# Patient Record
Sex: Female | Born: 1935
Health system: Southern US, Community
[De-identification: ages and names within clinical notes are randomized; demographics above are authoritative.]

## PROBLEM LIST (undated history)

## (undated) DIAGNOSIS — M1990 Unspecified osteoarthritis, unspecified site: Secondary | ICD-10-CM

## (undated) DIAGNOSIS — T8859XA Other complications of anesthesia, initial encounter: Secondary | ICD-10-CM

## (undated) DIAGNOSIS — H3530 Unspecified macular degeneration: Secondary | ICD-10-CM

## (undated) DIAGNOSIS — E119 Type 2 diabetes mellitus without complications: Secondary | ICD-10-CM

## (undated) DIAGNOSIS — R112 Nausea with vomiting, unspecified: Secondary | ICD-10-CM

## (undated) DIAGNOSIS — E039 Hypothyroidism, unspecified: Secondary | ICD-10-CM

## (undated) DIAGNOSIS — I10 Essential (primary) hypertension: Secondary | ICD-10-CM

## (undated) DIAGNOSIS — D649 Anemia, unspecified: Secondary | ICD-10-CM

## (undated) DIAGNOSIS — T4145XA Adverse effect of unspecified anesthetic, initial encounter: Secondary | ICD-10-CM

## (undated) DIAGNOSIS — N189 Chronic kidney disease, unspecified: Secondary | ICD-10-CM

## (undated) DIAGNOSIS — Z9889 Other specified postprocedural states: Secondary | ICD-10-CM

## (undated) DIAGNOSIS — K219 Gastro-esophageal reflux disease without esophagitis: Secondary | ICD-10-CM

## (undated) HISTORY — PX: EYE SURGERY: SHX253

---
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
## 1968-02-14 HISTORY — PX: ABDOMINAL HYSTERECTOMY: SHX81

## 2011-01-19 DIAGNOSIS — I639 Cerebral infarction, unspecified: Secondary | ICD-10-CM

## 2011-01-19 HISTORY — DX: Cerebral infarction, unspecified: I63.9

## 2013-02-21 DIAGNOSIS — E039 Hypothyroidism, unspecified: Secondary | ICD-10-CM | POA: Diagnosis not present

## 2013-02-24 DIAGNOSIS — H401290 Low-tension glaucoma, unspecified eye, stage unspecified: Secondary | ICD-10-CM | POA: Diagnosis not present

## 2013-02-24 DIAGNOSIS — H353190 Nonexudative age-related macular degeneration, unspecified eye, stage unspecified: Secondary | ICD-10-CM | POA: Diagnosis not present

## 2013-02-24 DIAGNOSIS — H02839 Dermatochalasis of unspecified eye, unspecified eyelid: Secondary | ICD-10-CM | POA: Diagnosis not present

## 2013-02-24 DIAGNOSIS — H353290 Exudative age-related macular degeneration, unspecified eye, stage unspecified: Secondary | ICD-10-CM | POA: Diagnosis not present

## 2013-02-24 DIAGNOSIS — H35739 Hemorrhagic detachment of retinal pigment epithelium, unspecified eye: Secondary | ICD-10-CM | POA: Diagnosis not present

## 2013-03-10 DIAGNOSIS — Z85828 Personal history of other malignant neoplasm of skin: Secondary | ICD-10-CM | POA: Diagnosis not present

## 2013-03-10 DIAGNOSIS — I872 Venous insufficiency (chronic) (peripheral): Secondary | ICD-10-CM | POA: Diagnosis not present

## 2013-04-23 DIAGNOSIS — H02839 Dermatochalasis of unspecified eye, unspecified eyelid: Secondary | ICD-10-CM | POA: Diagnosis not present

## 2013-04-23 DIAGNOSIS — H35739 Hemorrhagic detachment of retinal pigment epithelium, unspecified eye: Secondary | ICD-10-CM | POA: Diagnosis not present

## 2013-04-23 DIAGNOSIS — H353290 Exudative age-related macular degeneration, unspecified eye, stage unspecified: Secondary | ICD-10-CM | POA: Diagnosis not present

## 2013-04-23 DIAGNOSIS — H353190 Nonexudative age-related macular degeneration, unspecified eye, stage unspecified: Secondary | ICD-10-CM | POA: Diagnosis not present

## 2013-04-23 DIAGNOSIS — H401290 Low-tension glaucoma, unspecified eye, stage unspecified: Secondary | ICD-10-CM | POA: Diagnosis not present

## 2013-05-06 DIAGNOSIS — E875 Hyperkalemia: Secondary | ICD-10-CM | POA: Diagnosis not present

## 2013-05-06 DIAGNOSIS — E782 Mixed hyperlipidemia: Secondary | ICD-10-CM | POA: Diagnosis not present

## 2013-05-06 DIAGNOSIS — I10 Essential (primary) hypertension: Secondary | ICD-10-CM | POA: Diagnosis not present

## 2013-05-06 DIAGNOSIS — IMO0001 Reserved for inherently not codable concepts without codable children: Secondary | ICD-10-CM | POA: Diagnosis not present

## 2013-05-22 DIAGNOSIS — H3530 Unspecified macular degeneration: Secondary | ICD-10-CM | POA: Diagnosis not present

## 2013-05-22 DIAGNOSIS — I714 Abdominal aortic aneurysm, without rupture, unspecified: Secondary | ICD-10-CM | POA: Diagnosis not present

## 2013-05-22 DIAGNOSIS — E039 Hypothyroidism, unspecified: Secondary | ICD-10-CM | POA: Diagnosis not present

## 2013-05-22 DIAGNOSIS — E1129 Type 2 diabetes mellitus with other diabetic kidney complication: Secondary | ICD-10-CM | POA: Diagnosis not present

## 2013-05-22 DIAGNOSIS — E782 Mixed hyperlipidemia: Secondary | ICD-10-CM | POA: Diagnosis not present

## 2013-05-22 DIAGNOSIS — I6789 Other cerebrovascular disease: Secondary | ICD-10-CM | POA: Diagnosis not present

## 2013-05-22 DIAGNOSIS — N183 Chronic kidney disease, stage 3 unspecified: Secondary | ICD-10-CM | POA: Diagnosis not present

## 2013-05-22 DIAGNOSIS — I10 Essential (primary) hypertension: Secondary | ICD-10-CM | POA: Diagnosis not present

## 2013-06-25 DIAGNOSIS — H353290 Exudative age-related macular degeneration, unspecified eye, stage unspecified: Secondary | ICD-10-CM | POA: Diagnosis not present

## 2013-06-25 DIAGNOSIS — H35739 Hemorrhagic detachment of retinal pigment epithelium, unspecified eye: Secondary | ICD-10-CM | POA: Diagnosis not present

## 2013-06-25 DIAGNOSIS — H353190 Nonexudative age-related macular degeneration, unspecified eye, stage unspecified: Secondary | ICD-10-CM | POA: Diagnosis not present

## 2013-06-30 DIAGNOSIS — S139XXA Sprain of joints and ligaments of unspecified parts of neck, initial encounter: Secondary | ICD-10-CM | POA: Diagnosis not present

## 2013-07-09 DIAGNOSIS — J019 Acute sinusitis, unspecified: Secondary | ICD-10-CM | POA: Diagnosis not present

## 2013-08-08 DIAGNOSIS — I10 Essential (primary) hypertension: Secondary | ICD-10-CM | POA: Diagnosis not present

## 2013-08-08 DIAGNOSIS — R918 Other nonspecific abnormal finding of lung field: Secondary | ICD-10-CM | POA: Diagnosis not present

## 2013-08-08 DIAGNOSIS — I712 Thoracic aortic aneurysm, without rupture, unspecified: Secondary | ICD-10-CM | POA: Diagnosis not present

## 2013-08-08 DIAGNOSIS — N269 Renal sclerosis, unspecified: Secondary | ICD-10-CM | POA: Diagnosis not present

## 2013-08-12 DIAGNOSIS — I729 Aneurysm of unspecified site: Secondary | ICD-10-CM | POA: Diagnosis not present

## 2013-08-12 DIAGNOSIS — I10 Essential (primary) hypertension: Secondary | ICD-10-CM | POA: Diagnosis not present

## 2013-08-12 DIAGNOSIS — R51 Headache: Secondary | ICD-10-CM | POA: Diagnosis not present

## 2013-08-12 DIAGNOSIS — M542 Cervicalgia: Secondary | ICD-10-CM | POA: Diagnosis not present

## 2013-08-18 DIAGNOSIS — H532 Diplopia: Secondary | ICD-10-CM | POA: Diagnosis not present

## 2013-08-18 DIAGNOSIS — R030 Elevated blood-pressure reading, without diagnosis of hypertension: Secondary | ICD-10-CM | POA: Diagnosis not present

## 2013-08-18 DIAGNOSIS — R110 Nausea: Secondary | ICD-10-CM | POA: Diagnosis not present

## 2013-08-18 DIAGNOSIS — R51 Headache: Secondary | ICD-10-CM | POA: Diagnosis not present

## 2013-08-19 DIAGNOSIS — H401290 Low-tension glaucoma, unspecified eye, stage unspecified: Secondary | ICD-10-CM | POA: Diagnosis not present

## 2013-08-19 DIAGNOSIS — H35739 Hemorrhagic detachment of retinal pigment epithelium, unspecified eye: Secondary | ICD-10-CM | POA: Diagnosis not present

## 2013-08-19 DIAGNOSIS — H353190 Nonexudative age-related macular degeneration, unspecified eye, stage unspecified: Secondary | ICD-10-CM | POA: Diagnosis not present

## 2013-08-19 DIAGNOSIS — H353290 Exudative age-related macular degeneration, unspecified eye, stage unspecified: Secondary | ICD-10-CM | POA: Diagnosis not present

## 2013-08-22 DIAGNOSIS — I10 Essential (primary) hypertension: Secondary | ICD-10-CM | POA: Diagnosis not present

## 2013-08-22 DIAGNOSIS — M542 Cervicalgia: Secondary | ICD-10-CM | POA: Diagnosis not present

## 2013-08-22 DIAGNOSIS — M62838 Other muscle spasm: Secondary | ICD-10-CM | POA: Diagnosis not present

## 2013-08-22 DIAGNOSIS — I729 Aneurysm of unspecified site: Secondary | ICD-10-CM | POA: Diagnosis not present

## 2013-08-29 DIAGNOSIS — S139XXA Sprain of joints and ligaments of unspecified parts of neck, initial encounter: Secondary | ICD-10-CM | POA: Diagnosis not present

## 2013-08-29 DIAGNOSIS — I10 Essential (primary) hypertension: Secondary | ICD-10-CM | POA: Diagnosis not present

## 2013-09-04 DIAGNOSIS — M5030 Other cervical disc degeneration, unspecified cervical region: Secondary | ICD-10-CM | POA: Diagnosis not present

## 2013-09-04 DIAGNOSIS — M4802 Spinal stenosis, cervical region: Secondary | ICD-10-CM | POA: Diagnosis not present

## 2013-09-04 DIAGNOSIS — M47812 Spondylosis without myelopathy or radiculopathy, cervical region: Secondary | ICD-10-CM | POA: Diagnosis not present

## 2013-09-09 DIAGNOSIS — L570 Actinic keratosis: Secondary | ICD-10-CM | POA: Diagnosis not present

## 2013-09-09 DIAGNOSIS — L98499 Non-pressure chronic ulcer of skin of other sites with unspecified severity: Secondary | ICD-10-CM | POA: Diagnosis not present

## 2013-09-09 DIAGNOSIS — L259 Unspecified contact dermatitis, unspecified cause: Secondary | ICD-10-CM | POA: Diagnosis not present

## 2013-09-09 DIAGNOSIS — I10 Essential (primary) hypertension: Secondary | ICD-10-CM | POA: Diagnosis not present

## 2013-09-23 DIAGNOSIS — M999 Biomechanical lesion, unspecified: Secondary | ICD-10-CM | POA: Diagnosis not present

## 2013-09-23 DIAGNOSIS — IMO0002 Reserved for concepts with insufficient information to code with codable children: Secondary | ICD-10-CM | POA: Diagnosis not present

## 2013-09-24 DIAGNOSIS — M999 Biomechanical lesion, unspecified: Secondary | ICD-10-CM | POA: Diagnosis not present

## 2013-09-24 DIAGNOSIS — IMO0002 Reserved for concepts with insufficient information to code with codable children: Secondary | ICD-10-CM | POA: Diagnosis not present

## 2013-09-25 DIAGNOSIS — IMO0002 Reserved for concepts with insufficient information to code with codable children: Secondary | ICD-10-CM | POA: Diagnosis not present

## 2013-09-25 DIAGNOSIS — M999 Biomechanical lesion, unspecified: Secondary | ICD-10-CM | POA: Diagnosis not present

## 2013-09-29 DIAGNOSIS — IMO0002 Reserved for concepts with insufficient information to code with codable children: Secondary | ICD-10-CM | POA: Diagnosis not present

## 2013-09-29 DIAGNOSIS — M999 Biomechanical lesion, unspecified: Secondary | ICD-10-CM | POA: Diagnosis not present

## 2013-09-30 DIAGNOSIS — IMO0002 Reserved for concepts with insufficient information to code with codable children: Secondary | ICD-10-CM | POA: Diagnosis not present

## 2013-09-30 DIAGNOSIS — M999 Biomechanical lesion, unspecified: Secondary | ICD-10-CM | POA: Diagnosis not present

## 2013-10-01 DIAGNOSIS — IMO0002 Reserved for concepts with insufficient information to code with codable children: Secondary | ICD-10-CM | POA: Diagnosis not present

## 2013-10-01 DIAGNOSIS — E782 Mixed hyperlipidemia: Secondary | ICD-10-CM | POA: Diagnosis not present

## 2013-10-01 DIAGNOSIS — M999 Biomechanical lesion, unspecified: Secondary | ICD-10-CM | POA: Diagnosis not present

## 2013-10-01 DIAGNOSIS — N183 Chronic kidney disease, stage 3 unspecified: Secondary | ICD-10-CM | POA: Diagnosis not present

## 2013-10-01 DIAGNOSIS — E039 Hypothyroidism, unspecified: Secondary | ICD-10-CM | POA: Diagnosis not present

## 2013-10-01 DIAGNOSIS — IMO0001 Reserved for inherently not codable concepts without codable children: Secondary | ICD-10-CM | POA: Diagnosis not present

## 2013-10-01 DIAGNOSIS — I10 Essential (primary) hypertension: Secondary | ICD-10-CM | POA: Diagnosis not present

## 2013-10-06 DIAGNOSIS — IMO0002 Reserved for concepts with insufficient information to code with codable children: Secondary | ICD-10-CM | POA: Diagnosis not present

## 2013-10-06 DIAGNOSIS — M999 Biomechanical lesion, unspecified: Secondary | ICD-10-CM | POA: Diagnosis not present

## 2013-10-07 DIAGNOSIS — M999 Biomechanical lesion, unspecified: Secondary | ICD-10-CM | POA: Diagnosis not present

## 2013-10-07 DIAGNOSIS — IMO0002 Reserved for concepts with insufficient information to code with codable children: Secondary | ICD-10-CM | POA: Diagnosis not present

## 2013-10-09 DIAGNOSIS — I714 Abdominal aortic aneurysm, without rupture, unspecified: Secondary | ICD-10-CM | POA: Diagnosis not present

## 2013-10-09 DIAGNOSIS — E782 Mixed hyperlipidemia: Secondary | ICD-10-CM | POA: Diagnosis not present

## 2013-10-09 DIAGNOSIS — H3530 Unspecified macular degeneration: Secondary | ICD-10-CM | POA: Diagnosis not present

## 2013-10-09 DIAGNOSIS — I10 Essential (primary) hypertension: Secondary | ICD-10-CM | POA: Diagnosis not present

## 2013-10-09 DIAGNOSIS — I6789 Other cerebrovascular disease: Secondary | ICD-10-CM | POA: Diagnosis not present

## 2013-10-09 DIAGNOSIS — IMO0002 Reserved for concepts with insufficient information to code with codable children: Secondary | ICD-10-CM | POA: Diagnosis not present

## 2013-10-09 DIAGNOSIS — M999 Biomechanical lesion, unspecified: Secondary | ICD-10-CM | POA: Diagnosis not present

## 2013-10-09 DIAGNOSIS — E1129 Type 2 diabetes mellitus with other diabetic kidney complication: Secondary | ICD-10-CM | POA: Diagnosis not present

## 2013-10-09 DIAGNOSIS — N183 Chronic kidney disease, stage 3 unspecified: Secondary | ICD-10-CM | POA: Diagnosis not present

## 2013-10-09 DIAGNOSIS — E039 Hypothyroidism, unspecified: Secondary | ICD-10-CM | POA: Diagnosis not present

## 2013-10-10 DIAGNOSIS — Z8679 Personal history of other diseases of the circulatory system: Secondary | ICD-10-CM | POA: Diagnosis not present

## 2013-10-10 DIAGNOSIS — I771 Stricture of artery: Secondary | ICD-10-CM | POA: Diagnosis not present

## 2013-10-10 DIAGNOSIS — R55 Syncope and collapse: Secondary | ICD-10-CM | POA: Diagnosis not present

## 2013-10-10 DIAGNOSIS — I10 Essential (primary) hypertension: Secondary | ICD-10-CM | POA: Diagnosis not present

## 2013-10-10 DIAGNOSIS — Z885 Allergy status to narcotic agent status: Secondary | ICD-10-CM | POA: Diagnosis not present

## 2013-10-10 DIAGNOSIS — Z7982 Long term (current) use of aspirin: Secondary | ICD-10-CM | POA: Diagnosis not present

## 2013-10-10 DIAGNOSIS — E039 Hypothyroidism, unspecified: Secondary | ICD-10-CM | POA: Diagnosis not present

## 2013-10-10 DIAGNOSIS — R112 Nausea with vomiting, unspecified: Secondary | ICD-10-CM | POA: Diagnosis not present

## 2013-10-10 DIAGNOSIS — R404 Transient alteration of awareness: Secondary | ICD-10-CM | POA: Diagnosis not present

## 2013-10-10 DIAGNOSIS — E119 Type 2 diabetes mellitus without complications: Secondary | ICD-10-CM | POA: Diagnosis not present

## 2013-10-10 DIAGNOSIS — J9819 Other pulmonary collapse: Secondary | ICD-10-CM | POA: Diagnosis not present

## 2013-10-10 DIAGNOSIS — I517 Cardiomegaly: Secondary | ICD-10-CM | POA: Diagnosis not present

## 2013-10-10 DIAGNOSIS — Z90710 Acquired absence of both cervix and uterus: Secondary | ICD-10-CM | POA: Diagnosis not present

## 2013-10-10 DIAGNOSIS — Z882 Allergy status to sulfonamides status: Secondary | ICD-10-CM | POA: Diagnosis not present

## 2013-10-10 DIAGNOSIS — E860 Dehydration: Secondary | ICD-10-CM | POA: Diagnosis not present

## 2013-10-10 DIAGNOSIS — Z79899 Other long term (current) drug therapy: Secondary | ICD-10-CM | POA: Diagnosis not present

## 2013-10-10 DIAGNOSIS — K449 Diaphragmatic hernia without obstruction or gangrene: Secondary | ICD-10-CM | POA: Diagnosis not present

## 2013-10-10 DIAGNOSIS — Z880 Allergy status to penicillin: Secondary | ICD-10-CM | POA: Diagnosis not present

## 2013-10-10 DIAGNOSIS — R6889 Other general symptoms and signs: Secondary | ICD-10-CM | POA: Diagnosis not present

## 2013-10-13 DIAGNOSIS — M999 Biomechanical lesion, unspecified: Secondary | ICD-10-CM | POA: Diagnosis not present

## 2013-10-13 DIAGNOSIS — IMO0002 Reserved for concepts with insufficient information to code with codable children: Secondary | ICD-10-CM | POA: Diagnosis not present

## 2013-10-13 DIAGNOSIS — R55 Syncope and collapse: Secondary | ICD-10-CM | POA: Diagnosis not present

## 2013-10-23 DIAGNOSIS — I6529 Occlusion and stenosis of unspecified carotid artery: Secondary | ICD-10-CM | POA: Diagnosis not present

## 2013-10-23 DIAGNOSIS — I716 Thoracoabdominal aortic aneurysm, without rupture, unspecified: Secondary | ICD-10-CM | POA: Diagnosis not present

## 2013-10-23 DIAGNOSIS — I712 Thoracic aortic aneurysm, without rupture, unspecified: Secondary | ICD-10-CM | POA: Diagnosis not present

## 2013-10-24 DIAGNOSIS — K5730 Diverticulosis of large intestine without perforation or abscess without bleeding: Secondary | ICD-10-CM | POA: Diagnosis not present

## 2013-10-24 DIAGNOSIS — M899 Disorder of bone, unspecified: Secondary | ICD-10-CM | POA: Diagnosis not present

## 2013-10-24 DIAGNOSIS — R918 Other nonspecific abnormal finding of lung field: Secondary | ICD-10-CM | POA: Diagnosis not present

## 2013-10-24 DIAGNOSIS — N2889 Other specified disorders of kidney and ureter: Secondary | ICD-10-CM | POA: Diagnosis not present

## 2013-10-24 DIAGNOSIS — I701 Atherosclerosis of renal artery: Secondary | ICD-10-CM | POA: Diagnosis not present

## 2013-10-24 DIAGNOSIS — N200 Calculus of kidney: Secondary | ICD-10-CM | POA: Diagnosis not present

## 2013-10-24 DIAGNOSIS — R599 Enlarged lymph nodes, unspecified: Secondary | ICD-10-CM | POA: Diagnosis not present

## 2013-10-24 DIAGNOSIS — Z01810 Encounter for preprocedural cardiovascular examination: Secondary | ICD-10-CM | POA: Diagnosis not present

## 2013-10-24 DIAGNOSIS — I716 Thoracoabdominal aortic aneurysm, without rupture, unspecified: Secondary | ICD-10-CM | POA: Diagnosis not present

## 2013-10-24 DIAGNOSIS — I749 Embolism and thrombosis of unspecified artery: Secondary | ICD-10-CM | POA: Diagnosis not present

## 2013-10-24 DIAGNOSIS — M949 Disorder of cartilage, unspecified: Secondary | ICD-10-CM | POA: Diagnosis not present

## 2013-10-24 DIAGNOSIS — N269 Renal sclerosis, unspecified: Secondary | ICD-10-CM | POA: Diagnosis not present

## 2013-10-28 DIAGNOSIS — Z23 Encounter for immunization: Secondary | ICD-10-CM | POA: Diagnosis not present

## 2013-11-05 DIAGNOSIS — H01009 Unspecified blepharitis unspecified eye, unspecified eyelid: Secondary | ICD-10-CM | POA: Diagnosis not present

## 2013-11-05 DIAGNOSIS — H02839 Dermatochalasis of unspecified eye, unspecified eyelid: Secondary | ICD-10-CM | POA: Diagnosis not present

## 2013-11-05 DIAGNOSIS — H35739 Hemorrhagic detachment of retinal pigment epithelium, unspecified eye: Secondary | ICD-10-CM | POA: Diagnosis not present

## 2013-11-05 DIAGNOSIS — H353290 Exudative age-related macular degeneration, unspecified eye, stage unspecified: Secondary | ICD-10-CM | POA: Diagnosis not present

## 2013-11-05 DIAGNOSIS — H353190 Nonexudative age-related macular degeneration, unspecified eye, stage unspecified: Secondary | ICD-10-CM | POA: Diagnosis not present

## 2013-11-05 DIAGNOSIS — H401290 Low-tension glaucoma, unspecified eye, stage unspecified: Secondary | ICD-10-CM | POA: Diagnosis not present

## 2013-11-13 HISTORY — PX: VASCULAR SURGERY: SHX849

## 2013-11-27 DIAGNOSIS — I10 Essential (primary) hypertension: Secondary | ICD-10-CM | POA: Diagnosis not present

## 2013-11-27 DIAGNOSIS — I716 Thoracoabdominal aortic aneurysm, without rupture: Secondary | ICD-10-CM | POA: Diagnosis not present

## 2013-11-27 DIAGNOSIS — I272 Other secondary pulmonary hypertension: Secondary | ICD-10-CM | POA: Diagnosis not present

## 2013-11-27 DIAGNOSIS — I712 Thoracic aortic aneurysm, without rupture: Secondary | ICD-10-CM | POA: Diagnosis not present

## 2013-11-27 DIAGNOSIS — R911 Solitary pulmonary nodule: Secondary | ICD-10-CM | POA: Diagnosis not present

## 2013-11-27 DIAGNOSIS — J9811 Atelectasis: Secondary | ICD-10-CM | POA: Diagnosis not present

## 2013-11-27 DIAGNOSIS — E785 Hyperlipidemia, unspecified: Secondary | ICD-10-CM | POA: Diagnosis not present

## 2013-11-27 DIAGNOSIS — I071 Rheumatic tricuspid insufficiency: Secondary | ICD-10-CM | POA: Diagnosis not present

## 2013-11-27 DIAGNOSIS — Z79899 Other long term (current) drug therapy: Secondary | ICD-10-CM | POA: Diagnosis not present

## 2013-11-27 DIAGNOSIS — I700 Atherosclerosis of aorta: Secondary | ICD-10-CM | POA: Diagnosis not present

## 2013-11-27 DIAGNOSIS — I517 Cardiomegaly: Secondary | ICD-10-CM | POA: Diagnosis not present

## 2013-11-27 DIAGNOSIS — Z7982 Long term (current) use of aspirin: Secondary | ICD-10-CM | POA: Diagnosis not present

## 2013-11-27 DIAGNOSIS — Z01818 Encounter for other preprocedural examination: Secondary | ICD-10-CM | POA: Diagnosis not present

## 2013-12-09 DIAGNOSIS — E785 Hyperlipidemia, unspecified: Secondary | ICD-10-CM | POA: Diagnosis present

## 2013-12-09 DIAGNOSIS — I716 Thoracoabdominal aortic aneurysm, without rupture: Secondary | ICD-10-CM | POA: Diagnosis not present

## 2013-12-09 DIAGNOSIS — N200 Calculus of kidney: Secondary | ICD-10-CM | POA: Diagnosis not present

## 2013-12-09 DIAGNOSIS — J95821 Acute postprocedural respiratory failure: Secondary | ICD-10-CM | POA: Diagnosis not present

## 2013-12-09 DIAGNOSIS — I10 Essential (primary) hypertension: Secondary | ICD-10-CM | POA: Diagnosis not present

## 2013-12-09 DIAGNOSIS — G8918 Other acute postprocedural pain: Secondary | ICD-10-CM | POA: Diagnosis not present

## 2013-12-09 DIAGNOSIS — J9601 Acute respiratory failure with hypoxia: Secondary | ICD-10-CM | POA: Diagnosis not present

## 2013-12-09 DIAGNOSIS — D62 Acute posthemorrhagic anemia: Secondary | ICD-10-CM | POA: Diagnosis not present

## 2013-12-09 DIAGNOSIS — I712 Thoracic aortic aneurysm, without rupture: Secondary | ICD-10-CM | POA: Diagnosis not present

## 2013-12-09 DIAGNOSIS — E039 Hypothyroidism, unspecified: Secondary | ICD-10-CM | POA: Diagnosis present

## 2013-12-09 DIAGNOSIS — N390 Urinary tract infection, site not specified: Secondary | ICD-10-CM | POA: Diagnosis not present

## 2013-12-09 DIAGNOSIS — Z8673 Personal history of transient ischemic attack (TIA), and cerebral infarction without residual deficits: Secondary | ICD-10-CM | POA: Diagnosis not present

## 2013-12-09 DIAGNOSIS — E119 Type 2 diabetes mellitus without complications: Secondary | ICD-10-CM | POA: Diagnosis not present

## 2013-12-09 DIAGNOSIS — M47819 Spondylosis without myelopathy or radiculopathy, site unspecified: Secondary | ICD-10-CM | POA: Diagnosis not present

## 2013-12-09 DIAGNOSIS — N289 Disorder of kidney and ureter, unspecified: Secondary | ICD-10-CM | POA: Diagnosis present

## 2013-12-09 DIAGNOSIS — I739 Peripheral vascular disease, unspecified: Secondary | ICD-10-CM | POA: Diagnosis not present

## 2013-12-09 DIAGNOSIS — I7772 Dissection of iliac artery: Secondary | ICD-10-CM | POA: Diagnosis not present

## 2013-12-25 DIAGNOSIS — I716 Thoracoabdominal aortic aneurysm, without rupture: Secondary | ICD-10-CM | POA: Diagnosis not present

## 2014-01-01 DIAGNOSIS — H01001 Unspecified blepharitis right upper eyelid: Secondary | ICD-10-CM | POA: Diagnosis not present

## 2014-01-01 DIAGNOSIS — H35731 Hemorrhagic detachment of retinal pigment epithelium, right eye: Secondary | ICD-10-CM | POA: Diagnosis not present

## 2014-01-01 DIAGNOSIS — H02834 Dermatochalasis of left upper eyelid: Secondary | ICD-10-CM | POA: Diagnosis not present

## 2014-01-01 DIAGNOSIS — H3531 Nonexudative age-related macular degeneration: Secondary | ICD-10-CM | POA: Diagnosis not present

## 2014-01-01 DIAGNOSIS — H01004 Unspecified blepharitis left upper eyelid: Secondary | ICD-10-CM | POA: Diagnosis not present

## 2014-01-01 DIAGNOSIS — H40003 Preglaucoma, unspecified, bilateral: Secondary | ICD-10-CM | POA: Diagnosis not present

## 2014-01-01 DIAGNOSIS — H3532 Exudative age-related macular degeneration: Secondary | ICD-10-CM | POA: Diagnosis not present

## 2014-01-01 DIAGNOSIS — H02831 Dermatochalasis of right upper eyelid: Secondary | ICD-10-CM | POA: Diagnosis not present

## 2014-01-16 DIAGNOSIS — I716 Thoracoabdominal aortic aneurysm, without rupture: Secondary | ICD-10-CM | POA: Diagnosis not present

## 2014-01-18 DIAGNOSIS — R1031 Right lower quadrant pain: Secondary | ICD-10-CM | POA: Diagnosis not present

## 2014-01-18 DIAGNOSIS — I441 Atrioventricular block, second degree: Secondary | ICD-10-CM | POA: Diagnosis not present

## 2014-01-18 DIAGNOSIS — I129 Hypertensive chronic kidney disease with stage 1 through stage 4 chronic kidney disease, or unspecified chronic kidney disease: Secondary | ICD-10-CM | POA: Diagnosis present

## 2014-01-18 DIAGNOSIS — N179 Acute kidney failure, unspecified: Secondary | ICD-10-CM | POA: Diagnosis not present

## 2014-01-18 DIAGNOSIS — E119 Type 2 diabetes mellitus without complications: Secondary | ICD-10-CM | POA: Diagnosis not present

## 2014-01-18 DIAGNOSIS — J984 Other disorders of lung: Secondary | ICD-10-CM | POA: Diagnosis not present

## 2014-01-18 DIAGNOSIS — E039 Hypothyroidism, unspecified: Secondary | ICD-10-CM | POA: Diagnosis present

## 2014-01-18 DIAGNOSIS — Z9889 Other specified postprocedural states: Secondary | ICD-10-CM | POA: Diagnosis not present

## 2014-01-18 DIAGNOSIS — Z882 Allergy status to sulfonamides status: Secondary | ICD-10-CM | POA: Diagnosis not present

## 2014-01-18 DIAGNOSIS — R001 Bradycardia, unspecified: Secondary | ICD-10-CM | POA: Diagnosis not present

## 2014-01-18 DIAGNOSIS — N289 Disorder of kidney and ureter, unspecified: Secondary | ICD-10-CM | POA: Diagnosis not present

## 2014-01-18 DIAGNOSIS — J90 Pleural effusion, not elsewhere classified: Secondary | ICD-10-CM | POA: Diagnosis present

## 2014-01-18 DIAGNOSIS — I716 Thoracoabdominal aortic aneurysm, without rupture: Secondary | ICD-10-CM | POA: Diagnosis not present

## 2014-01-18 DIAGNOSIS — D62 Acute posthemorrhagic anemia: Secondary | ICD-10-CM | POA: Diagnosis not present

## 2014-01-18 DIAGNOSIS — E785 Hyperlipidemia, unspecified: Secondary | ICD-10-CM | POA: Diagnosis present

## 2014-01-18 DIAGNOSIS — Z8673 Personal history of transient ischemic attack (TIA), and cerebral infarction without residual deficits: Secondary | ICD-10-CM | POA: Diagnosis not present

## 2014-01-18 DIAGNOSIS — I714 Abdominal aortic aneurysm, without rupture: Secondary | ICD-10-CM | POA: Diagnosis not present

## 2014-01-18 DIAGNOSIS — K6389 Other specified diseases of intestine: Secondary | ICD-10-CM | POA: Diagnosis not present

## 2014-01-18 DIAGNOSIS — I739 Peripheral vascular disease, unspecified: Secondary | ICD-10-CM | POA: Diagnosis not present

## 2014-01-18 DIAGNOSIS — I10 Essential (primary) hypertension: Secondary | ICD-10-CM | POA: Diagnosis not present

## 2014-01-18 DIAGNOSIS — D696 Thrombocytopenia, unspecified: Secondary | ICD-10-CM | POA: Diagnosis not present

## 2014-01-18 DIAGNOSIS — G8918 Other acute postprocedural pain: Secondary | ICD-10-CM | POA: Diagnosis not present

## 2014-01-18 DIAGNOSIS — Z48812 Encounter for surgical aftercare following surgery on the circulatory system: Secondary | ICD-10-CM | POA: Diagnosis not present

## 2014-01-18 DIAGNOSIS — Z880 Allergy status to penicillin: Secondary | ICD-10-CM | POA: Diagnosis not present

## 2014-01-18 DIAGNOSIS — F419 Anxiety disorder, unspecified: Secondary | ICD-10-CM | POA: Diagnosis present

## 2014-01-18 DIAGNOSIS — N189 Chronic kidney disease, unspecified: Secondary | ICD-10-CM | POA: Diagnosis not present

## 2014-01-27 DIAGNOSIS — N183 Chronic kidney disease, stage 3 (moderate): Secondary | ICD-10-CM | POA: Diagnosis not present

## 2014-01-27 DIAGNOSIS — Z9189 Other specified personal risk factors, not elsewhere classified: Secondary | ICD-10-CM | POA: Diagnosis not present

## 2014-01-27 DIAGNOSIS — I10 Essential (primary) hypertension: Secondary | ICD-10-CM | POA: Diagnosis not present

## 2014-01-27 DIAGNOSIS — E782 Mixed hyperlipidemia: Secondary | ICD-10-CM | POA: Diagnosis not present

## 2014-01-27 DIAGNOSIS — E1122 Type 2 diabetes mellitus with diabetic chronic kidney disease: Secondary | ICD-10-CM | POA: Diagnosis not present

## 2014-01-27 DIAGNOSIS — E039 Hypothyroidism, unspecified: Secondary | ICD-10-CM | POA: Diagnosis not present

## 2014-01-27 DIAGNOSIS — Z1389 Encounter for screening for other disorder: Secondary | ICD-10-CM | POA: Diagnosis not present

## 2014-01-27 DIAGNOSIS — H3530 Unspecified macular degeneration: Secondary | ICD-10-CM | POA: Diagnosis not present

## 2014-02-03 DIAGNOSIS — N189 Chronic kidney disease, unspecified: Secondary | ICD-10-CM | POA: Diagnosis not present

## 2014-02-04 DIAGNOSIS — R1011 Right upper quadrant pain: Secondary | ICD-10-CM | POA: Diagnosis not present

## 2014-02-04 DIAGNOSIS — R109 Unspecified abdominal pain: Secondary | ICD-10-CM | POA: Diagnosis not present

## 2014-02-10 DIAGNOSIS — H02834 Dermatochalasis of left upper eyelid: Secondary | ICD-10-CM | POA: Diagnosis not present

## 2014-02-10 DIAGNOSIS — H3532 Exudative age-related macular degeneration: Secondary | ICD-10-CM | POA: Diagnosis not present

## 2014-02-10 DIAGNOSIS — H40003 Preglaucoma, unspecified, bilateral: Secondary | ICD-10-CM | POA: Diagnosis not present

## 2014-02-10 DIAGNOSIS — H35731 Hemorrhagic detachment of retinal pigment epithelium, right eye: Secondary | ICD-10-CM | POA: Diagnosis not present

## 2014-02-10 DIAGNOSIS — H02831 Dermatochalasis of right upper eyelid: Secondary | ICD-10-CM | POA: Diagnosis not present

## 2014-02-10 DIAGNOSIS — H3531 Nonexudative age-related macular degeneration: Secondary | ICD-10-CM | POA: Diagnosis not present

## 2014-02-10 DIAGNOSIS — H01004 Unspecified blepharitis left upper eyelid: Secondary | ICD-10-CM | POA: Diagnosis not present

## 2014-02-10 DIAGNOSIS — H01001 Unspecified blepharitis right upper eyelid: Secondary | ICD-10-CM | POA: Diagnosis not present

## 2014-02-16 DIAGNOSIS — E039 Hypothyroidism, unspecified: Secondary | ICD-10-CM | POA: Diagnosis not present

## 2014-02-16 DIAGNOSIS — E782 Mixed hyperlipidemia: Secondary | ICD-10-CM | POA: Diagnosis not present

## 2014-02-16 DIAGNOSIS — N183 Chronic kidney disease, stage 3 (moderate): Secondary | ICD-10-CM | POA: Diagnosis not present

## 2014-02-16 DIAGNOSIS — E1122 Type 2 diabetes mellitus with diabetic chronic kidney disease: Secondary | ICD-10-CM | POA: Diagnosis not present

## 2014-02-16 DIAGNOSIS — I10 Essential (primary) hypertension: Secondary | ICD-10-CM | POA: Diagnosis not present

## 2014-02-19 DIAGNOSIS — M8580 Other specified disorders of bone density and structure, unspecified site: Secondary | ICD-10-CM | POA: Diagnosis not present

## 2014-02-19 DIAGNOSIS — R590 Localized enlarged lymph nodes: Secondary | ICD-10-CM | POA: Diagnosis not present

## 2014-02-19 DIAGNOSIS — I716 Thoracoabdominal aortic aneurysm, without rupture: Secondary | ICD-10-CM | POA: Diagnosis not present

## 2014-02-19 DIAGNOSIS — I712 Thoracic aortic aneurysm, without rupture: Secondary | ICD-10-CM | POA: Diagnosis not present

## 2014-02-19 DIAGNOSIS — R918 Other nonspecific abnormal finding of lung field: Secondary | ICD-10-CM | POA: Diagnosis not present

## 2014-02-19 DIAGNOSIS — Z48812 Encounter for surgical aftercare following surgery on the circulatory system: Secondary | ICD-10-CM | POA: Diagnosis not present

## 2014-02-19 DIAGNOSIS — M47819 Spondylosis without myelopathy or radiculopathy, site unspecified: Secondary | ICD-10-CM | POA: Diagnosis not present

## 2014-02-19 DIAGNOSIS — K5730 Diverticulosis of large intestine without perforation or abscess without bleeding: Secondary | ICD-10-CM | POA: Diagnosis not present

## 2014-02-19 DIAGNOSIS — R911 Solitary pulmonary nodule: Secondary | ICD-10-CM | POA: Diagnosis not present

## 2014-02-19 DIAGNOSIS — N200 Calculus of kidney: Secondary | ICD-10-CM | POA: Diagnosis not present

## 2014-02-23 DIAGNOSIS — E1122 Type 2 diabetes mellitus with diabetic chronic kidney disease: Secondary | ICD-10-CM | POA: Diagnosis not present

## 2014-02-23 DIAGNOSIS — F331 Major depressive disorder, recurrent, moderate: Secondary | ICD-10-CM | POA: Diagnosis not present

## 2014-02-23 DIAGNOSIS — N183 Chronic kidney disease, stage 3 (moderate): Secondary | ICD-10-CM | POA: Diagnosis not present

## 2014-02-23 DIAGNOSIS — E782 Mixed hyperlipidemia: Secondary | ICD-10-CM | POA: Diagnosis not present

## 2014-02-23 DIAGNOSIS — Z9189 Other specified personal risk factors, not elsewhere classified: Secondary | ICD-10-CM | POA: Diagnosis not present

## 2014-02-23 DIAGNOSIS — E875 Hyperkalemia: Secondary | ICD-10-CM | POA: Diagnosis not present

## 2014-02-23 DIAGNOSIS — I10 Essential (primary) hypertension: Secondary | ICD-10-CM | POA: Diagnosis not present

## 2014-02-23 DIAGNOSIS — I714 Abdominal aortic aneurysm, without rupture: Secondary | ICD-10-CM | POA: Diagnosis not present

## 2014-02-23 DIAGNOSIS — K210 Gastro-esophageal reflux disease with esophagitis: Secondary | ICD-10-CM | POA: Diagnosis not present

## 2014-03-10 DIAGNOSIS — Z85828 Personal history of other malignant neoplasm of skin: Secondary | ICD-10-CM | POA: Diagnosis not present

## 2014-03-10 DIAGNOSIS — L570 Actinic keratosis: Secondary | ICD-10-CM | POA: Diagnosis not present

## 2014-03-12 DIAGNOSIS — H02831 Dermatochalasis of right upper eyelid: Secondary | ICD-10-CM | POA: Diagnosis not present

## 2014-03-12 DIAGNOSIS — H01001 Unspecified blepharitis right upper eyelid: Secondary | ICD-10-CM | POA: Diagnosis not present

## 2014-03-12 DIAGNOSIS — H3531 Nonexudative age-related macular degeneration: Secondary | ICD-10-CM | POA: Diagnosis not present

## 2014-03-12 DIAGNOSIS — H02834 Dermatochalasis of left upper eyelid: Secondary | ICD-10-CM | POA: Diagnosis not present

## 2014-03-12 DIAGNOSIS — H01004 Unspecified blepharitis left upper eyelid: Secondary | ICD-10-CM | POA: Diagnosis not present

## 2014-03-12 DIAGNOSIS — H40003 Preglaucoma, unspecified, bilateral: Secondary | ICD-10-CM | POA: Diagnosis not present

## 2014-03-12 DIAGNOSIS — H35731 Hemorrhagic detachment of retinal pigment epithelium, right eye: Secondary | ICD-10-CM | POA: Diagnosis not present

## 2014-03-12 DIAGNOSIS — H3532 Exudative age-related macular degeneration: Secondary | ICD-10-CM | POA: Diagnosis not present

## 2014-03-27 DIAGNOSIS — Z79891 Long term (current) use of opiate analgesic: Secondary | ICD-10-CM | POA: Diagnosis not present

## 2014-03-27 DIAGNOSIS — D62 Acute posthemorrhagic anemia: Secondary | ICD-10-CM | POA: Diagnosis present

## 2014-03-27 DIAGNOSIS — F419 Anxiety disorder, unspecified: Secondary | ICD-10-CM | POA: Diagnosis present

## 2014-03-27 DIAGNOSIS — K219 Gastro-esophageal reflux disease without esophagitis: Secondary | ICD-10-CM | POA: Diagnosis present

## 2014-03-27 DIAGNOSIS — Z79899 Other long term (current) drug therapy: Secondary | ICD-10-CM | POA: Diagnosis not present

## 2014-03-27 DIAGNOSIS — Z880 Allergy status to penicillin: Secondary | ICD-10-CM | POA: Diagnosis not present

## 2014-03-27 DIAGNOSIS — I10 Essential (primary) hypertension: Secondary | ICD-10-CM | POA: Diagnosis not present

## 2014-03-27 DIAGNOSIS — K254 Chronic or unspecified gastric ulcer with hemorrhage: Secondary | ICD-10-CM | POA: Diagnosis not present

## 2014-03-27 DIAGNOSIS — K250 Acute gastric ulcer with hemorrhage: Secondary | ICD-10-CM | POA: Diagnosis not present

## 2014-03-27 DIAGNOSIS — Z87891 Personal history of nicotine dependence: Secondary | ICD-10-CM | POA: Diagnosis not present

## 2014-03-27 DIAGNOSIS — T82338A Leakage of other vascular grafts, initial encounter: Secondary | ICD-10-CM | POA: Diagnosis not present

## 2014-03-27 DIAGNOSIS — D649 Anemia, unspecified: Secondary | ICD-10-CM | POA: Diagnosis not present

## 2014-03-27 DIAGNOSIS — M545 Low back pain: Secondary | ICD-10-CM | POA: Diagnosis not present

## 2014-03-27 DIAGNOSIS — Z882 Allergy status to sulfonamides status: Secondary | ICD-10-CM | POA: Diagnosis not present

## 2014-03-27 DIAGNOSIS — Z8679 Personal history of other diseases of the circulatory system: Secondary | ICD-10-CM | POA: Diagnosis not present

## 2014-03-27 DIAGNOSIS — I739 Peripheral vascular disease, unspecified: Secondary | ICD-10-CM | POA: Diagnosis present

## 2014-03-27 DIAGNOSIS — Z7902 Long term (current) use of antithrombotics/antiplatelets: Secondary | ICD-10-CM | POA: Diagnosis not present

## 2014-03-27 DIAGNOSIS — K922 Gastrointestinal hemorrhage, unspecified: Secondary | ICD-10-CM | POA: Diagnosis not present

## 2014-03-27 DIAGNOSIS — Z66 Do not resuscitate: Secondary | ICD-10-CM | POA: Diagnosis present

## 2014-03-27 DIAGNOSIS — Z7982 Long term (current) use of aspirin: Secondary | ICD-10-CM | POA: Diagnosis not present

## 2014-03-27 DIAGNOSIS — T82330A Leakage of aortic (bifurcation) graft (replacement), initial encounter: Secondary | ICD-10-CM | POA: Diagnosis not present

## 2014-03-27 DIAGNOSIS — Z8249 Family history of ischemic heart disease and other diseases of the circulatory system: Secondary | ICD-10-CM | POA: Diagnosis not present

## 2014-03-27 DIAGNOSIS — T829XXA Unspecified complication of cardiac and vascular prosthetic device, implant and graft, initial encounter: Secondary | ICD-10-CM | POA: Diagnosis not present

## 2014-03-27 DIAGNOSIS — D509 Iron deficiency anemia, unspecified: Secondary | ICD-10-CM | POA: Diagnosis present

## 2014-03-27 DIAGNOSIS — E785 Hyperlipidemia, unspecified: Secondary | ICD-10-CM | POA: Diagnosis not present

## 2014-03-27 DIAGNOSIS — K921 Melena: Secondary | ICD-10-CM | POA: Diagnosis not present

## 2014-03-27 DIAGNOSIS — Z48812 Encounter for surgical aftercare following surgery on the circulatory system: Secondary | ICD-10-CM | POA: Diagnosis not present

## 2014-03-27 DIAGNOSIS — T82330D Leakage of aortic (bifurcation) graft (replacement), subsequent encounter: Secondary | ICD-10-CM | POA: Diagnosis not present

## 2014-03-27 DIAGNOSIS — Z8673 Personal history of transient ischemic attack (TIA), and cerebral infarction without residual deficits: Secondary | ICD-10-CM | POA: Diagnosis not present

## 2014-03-27 DIAGNOSIS — M549 Dorsalgia, unspecified: Secondary | ICD-10-CM | POA: Diagnosis not present

## 2014-03-27 DIAGNOSIS — I639 Cerebral infarction, unspecified: Secondary | ICD-10-CM | POA: Diagnosis not present

## 2014-03-27 DIAGNOSIS — I714 Abdominal aortic aneurysm, without rupture: Secondary | ICD-10-CM | POA: Diagnosis not present

## 2014-03-27 DIAGNOSIS — E119 Type 2 diabetes mellitus without complications: Secondary | ICD-10-CM | POA: Diagnosis present

## 2014-03-27 DIAGNOSIS — E039 Hypothyroidism, unspecified: Secondary | ICD-10-CM | POA: Diagnosis not present

## 2014-03-27 DIAGNOSIS — Z885 Allergy status to narcotic agent status: Secondary | ICD-10-CM | POA: Diagnosis not present

## 2014-03-27 DIAGNOSIS — M79609 Pain in unspecified limb: Secondary | ICD-10-CM | POA: Diagnosis not present

## 2014-04-09 DIAGNOSIS — H40003 Preglaucoma, unspecified, bilateral: Secondary | ICD-10-CM | POA: Diagnosis not present

## 2014-04-09 DIAGNOSIS — H01004 Unspecified blepharitis left upper eyelid: Secondary | ICD-10-CM | POA: Diagnosis not present

## 2014-04-09 DIAGNOSIS — H02834 Dermatochalasis of left upper eyelid: Secondary | ICD-10-CM | POA: Diagnosis not present

## 2014-04-09 DIAGNOSIS — H3531 Nonexudative age-related macular degeneration: Secondary | ICD-10-CM | POA: Diagnosis not present

## 2014-04-09 DIAGNOSIS — H02831 Dermatochalasis of right upper eyelid: Secondary | ICD-10-CM | POA: Diagnosis not present

## 2014-04-09 DIAGNOSIS — H01001 Unspecified blepharitis right upper eyelid: Secondary | ICD-10-CM | POA: Diagnosis not present

## 2014-04-09 DIAGNOSIS — H3532 Exudative age-related macular degeneration: Secondary | ICD-10-CM | POA: Diagnosis not present

## 2014-04-09 DIAGNOSIS — H35731 Hemorrhagic detachment of retinal pigment epithelium, right eye: Secondary | ICD-10-CM | POA: Diagnosis not present

## 2014-04-13 DIAGNOSIS — M5136 Other intervertebral disc degeneration, lumbar region: Secondary | ICD-10-CM | POA: Diagnosis not present

## 2014-04-13 DIAGNOSIS — M4806 Spinal stenosis, lumbar region: Secondary | ICD-10-CM | POA: Diagnosis not present

## 2014-04-13 DIAGNOSIS — M545 Low back pain: Secondary | ICD-10-CM | POA: Diagnosis not present

## 2014-04-13 DIAGNOSIS — M4186 Other forms of scoliosis, lumbar region: Secondary | ICD-10-CM | POA: Diagnosis not present

## 2014-04-13 DIAGNOSIS — M419 Scoliosis, unspecified: Secondary | ICD-10-CM | POA: Diagnosis not present

## 2014-04-23 DIAGNOSIS — Z6826 Body mass index (BMI) 26.0-26.9, adult: Secondary | ICD-10-CM | POA: Diagnosis not present

## 2014-04-23 DIAGNOSIS — Z006 Encounter for examination for normal comparison and control in clinical research program: Secondary | ICD-10-CM | POA: Diagnosis not present

## 2014-04-23 DIAGNOSIS — I716 Thoracoabdominal aortic aneurysm, without rupture: Secondary | ICD-10-CM | POA: Diagnosis not present

## 2014-04-23 DIAGNOSIS — Z09 Encounter for follow-up examination after completed treatment for conditions other than malignant neoplasm: Secondary | ICD-10-CM | POA: Diagnosis not present

## 2014-04-30 DIAGNOSIS — H3531 Nonexudative age-related macular degeneration: Secondary | ICD-10-CM | POA: Diagnosis not present

## 2014-04-30 DIAGNOSIS — H01001 Unspecified blepharitis right upper eyelid: Secondary | ICD-10-CM | POA: Diagnosis not present

## 2014-04-30 DIAGNOSIS — H3532 Exudative age-related macular degeneration: Secondary | ICD-10-CM | POA: Diagnosis not present

## 2014-04-30 DIAGNOSIS — H02831 Dermatochalasis of right upper eyelid: Secondary | ICD-10-CM | POA: Diagnosis not present

## 2014-04-30 DIAGNOSIS — H01004 Unspecified blepharitis left upper eyelid: Secondary | ICD-10-CM | POA: Diagnosis not present

## 2014-04-30 DIAGNOSIS — H02834 Dermatochalasis of left upper eyelid: Secondary | ICD-10-CM | POA: Diagnosis not present

## 2014-04-30 DIAGNOSIS — H35731 Hemorrhagic detachment of retinal pigment epithelium, right eye: Secondary | ICD-10-CM | POA: Diagnosis not present

## 2014-04-30 DIAGNOSIS — H40003 Preglaucoma, unspecified, bilateral: Secondary | ICD-10-CM | POA: Diagnosis not present

## 2014-05-04 DIAGNOSIS — M545 Low back pain: Secondary | ICD-10-CM | POA: Diagnosis not present

## 2014-05-04 DIAGNOSIS — K250 Acute gastric ulcer with hemorrhage: Secondary | ICD-10-CM | POA: Diagnosis not present

## 2014-05-04 DIAGNOSIS — D500 Iron deficiency anemia secondary to blood loss (chronic): Secondary | ICD-10-CM | POA: Diagnosis not present

## 2014-05-25 DIAGNOSIS — E875 Hyperkalemia: Secondary | ICD-10-CM | POA: Diagnosis not present

## 2014-05-25 DIAGNOSIS — E1122 Type 2 diabetes mellitus with diabetic chronic kidney disease: Secondary | ICD-10-CM | POA: Diagnosis not present

## 2014-05-25 DIAGNOSIS — E039 Hypothyroidism, unspecified: Secondary | ICD-10-CM | POA: Diagnosis not present

## 2014-05-25 DIAGNOSIS — I10 Essential (primary) hypertension: Secondary | ICD-10-CM | POA: Diagnosis not present

## 2014-05-25 DIAGNOSIS — D500 Iron deficiency anemia secondary to blood loss (chronic): Secondary | ICD-10-CM | POA: Diagnosis not present

## 2014-05-25 DIAGNOSIS — E782 Mixed hyperlipidemia: Secondary | ICD-10-CM | POA: Diagnosis not present

## 2014-05-28 DIAGNOSIS — H35731 Hemorrhagic detachment of retinal pigment epithelium, right eye: Secondary | ICD-10-CM | POA: Diagnosis not present

## 2014-05-28 DIAGNOSIS — H02834 Dermatochalasis of left upper eyelid: Secondary | ICD-10-CM | POA: Diagnosis not present

## 2014-05-28 DIAGNOSIS — H01001 Unspecified blepharitis right upper eyelid: Secondary | ICD-10-CM | POA: Diagnosis not present

## 2014-05-28 DIAGNOSIS — H02831 Dermatochalasis of right upper eyelid: Secondary | ICD-10-CM | POA: Diagnosis not present

## 2014-05-28 DIAGNOSIS — H3532 Exudative age-related macular degeneration: Secondary | ICD-10-CM | POA: Diagnosis not present

## 2014-05-28 DIAGNOSIS — H40003 Preglaucoma, unspecified, bilateral: Secondary | ICD-10-CM | POA: Diagnosis not present

## 2014-05-28 DIAGNOSIS — H3531 Nonexudative age-related macular degeneration: Secondary | ICD-10-CM | POA: Diagnosis not present

## 2014-05-28 DIAGNOSIS — H01004 Unspecified blepharitis left upper eyelid: Secondary | ICD-10-CM | POA: Diagnosis not present

## 2014-06-01 DIAGNOSIS — H3530 Unspecified macular degeneration: Secondary | ICD-10-CM | POA: Diagnosis not present

## 2014-06-01 DIAGNOSIS — M545 Low back pain: Secondary | ICD-10-CM | POA: Diagnosis not present

## 2014-06-01 DIAGNOSIS — N184 Chronic kidney disease, stage 4 (severe): Secondary | ICD-10-CM | POA: Diagnosis not present

## 2014-06-01 DIAGNOSIS — E039 Hypothyroidism, unspecified: Secondary | ICD-10-CM | POA: Diagnosis not present

## 2014-06-01 DIAGNOSIS — I714 Abdominal aortic aneurysm, without rupture: Secondary | ICD-10-CM | POA: Diagnosis not present

## 2014-06-01 DIAGNOSIS — F331 Major depressive disorder, recurrent, moderate: Secondary | ICD-10-CM | POA: Diagnosis not present

## 2014-06-01 DIAGNOSIS — E1122 Type 2 diabetes mellitus with diabetic chronic kidney disease: Secondary | ICD-10-CM | POA: Diagnosis not present

## 2014-06-01 DIAGNOSIS — D500 Iron deficiency anemia secondary to blood loss (chronic): Secondary | ICD-10-CM | POA: Diagnosis not present

## 2014-06-01 DIAGNOSIS — I10 Essential (primary) hypertension: Secondary | ICD-10-CM | POA: Diagnosis not present

## 2014-06-01 DIAGNOSIS — E782 Mixed hyperlipidemia: Secondary | ICD-10-CM | POA: Diagnosis not present

## 2014-07-02 DIAGNOSIS — H3531 Nonexudative age-related macular degeneration: Secondary | ICD-10-CM | POA: Diagnosis not present

## 2014-07-02 DIAGNOSIS — H3532 Exudative age-related macular degeneration: Secondary | ICD-10-CM | POA: Diagnosis not present

## 2014-07-02 DIAGNOSIS — H02834 Dermatochalasis of left upper eyelid: Secondary | ICD-10-CM | POA: Diagnosis not present

## 2014-07-02 DIAGNOSIS — H01004 Unspecified blepharitis left upper eyelid: Secondary | ICD-10-CM | POA: Diagnosis not present

## 2014-07-02 DIAGNOSIS — H01001 Unspecified blepharitis right upper eyelid: Secondary | ICD-10-CM | POA: Diagnosis not present

## 2014-07-02 DIAGNOSIS — H02831 Dermatochalasis of right upper eyelid: Secondary | ICD-10-CM | POA: Diagnosis not present

## 2014-07-02 DIAGNOSIS — H35731 Hemorrhagic detachment of retinal pigment epithelium, right eye: Secondary | ICD-10-CM | POA: Diagnosis not present

## 2014-07-02 DIAGNOSIS — H40003 Preglaucoma, unspecified, bilateral: Secondary | ICD-10-CM | POA: Diagnosis not present

## 2014-07-23 NOTE — Patient Outreach (Signed)
[facility]) Care Management  07/23/2014  Lakisha Pleger [birth year] [id number]   Referral from [place], RN.  Ronnell Freshwater. [place], [place] Management [place] Assistant Phone: [phone] Fax: [phone]

## 2014-08-04 DIAGNOSIS — H35731 Hemorrhagic detachment of retinal pigment epithelium, right eye: Secondary | ICD-10-CM | POA: Diagnosis not present

## 2014-08-04 DIAGNOSIS — H40003 Preglaucoma, unspecified, bilateral: Secondary | ICD-10-CM | POA: Diagnosis not present

## 2014-08-04 DIAGNOSIS — H3532 Exudative age-related macular degeneration: Secondary | ICD-10-CM | POA: Diagnosis not present

## 2014-08-04 DIAGNOSIS — H02834 Dermatochalasis of left upper eyelid: Secondary | ICD-10-CM | POA: Diagnosis not present

## 2014-08-04 DIAGNOSIS — H01001 Unspecified blepharitis right upper eyelid: Secondary | ICD-10-CM | POA: Diagnosis not present

## 2014-08-04 DIAGNOSIS — H02831 Dermatochalasis of right upper eyelid: Secondary | ICD-10-CM | POA: Diagnosis not present

## 2014-08-04 DIAGNOSIS — H3531 Nonexudative age-related macular degeneration: Secondary | ICD-10-CM | POA: Diagnosis not present

## 2014-08-04 DIAGNOSIS — H01004 Unspecified blepharitis left upper eyelid: Secondary | ICD-10-CM | POA: Diagnosis not present

## 2014-08-05 ENCOUNTER — Other Ambulatory Visit: Payer: Self-pay

## 2014-08-05 NOTE — Patient Outreach (Signed)
[facility]) Care Management  08/05/2014  Catherine Rich [birth year] [id number]   Telephonic Care Management Note   Referral Date:  07/23/14 Referral Source:  PCP:  Dr. Gar Ponto, [facility] [phone]  Referral Issue:  HTN, Renal Failure, HF Triage Screening Date:   Outreach call #1 to patient for [facility] screening.   Patient not reached.  HIPAA compliant voice message left with name and contact number.   RN CM rescheduled for next call attempt.  [name], RN, BSN, [facility], CCM  [employer] Management Coordinator [phone] Office [phone] Direct [phone] Cell

## 2014-08-06 DIAGNOSIS — E039 Hypothyroidism, unspecified: Secondary | ICD-10-CM | POA: Diagnosis not present

## 2014-08-06 DIAGNOSIS — R918 Other nonspecific abnormal finding of lung field: Secondary | ICD-10-CM | POA: Diagnosis not present

## 2014-08-06 DIAGNOSIS — Z79899 Other long term (current) drug therapy: Secondary | ICD-10-CM | POA: Diagnosis not present

## 2014-08-06 DIAGNOSIS — Z09 Encounter for follow-up examination after completed treatment for conditions other than malignant neoplasm: Secondary | ICD-10-CM | POA: Diagnosis not present

## 2014-08-06 DIAGNOSIS — I2699 Other pulmonary embolism without acute cor pulmonale: Secondary | ICD-10-CM | POA: Diagnosis not present

## 2014-08-06 DIAGNOSIS — I10 Essential (primary) hypertension: Secondary | ICD-10-CM | POA: Diagnosis not present

## 2014-08-06 DIAGNOSIS — N200 Calculus of kidney: Secondary | ICD-10-CM | POA: Diagnosis not present

## 2014-08-06 DIAGNOSIS — Z87891 Personal history of nicotine dependence: Secondary | ICD-10-CM | POA: Diagnosis not present

## 2014-08-06 DIAGNOSIS — Z8673 Personal history of transient ischemic attack (TIA), and cerebral infarction without residual deficits: Secondary | ICD-10-CM | POA: Diagnosis not present

## 2014-08-06 DIAGNOSIS — I716 Thoracoabdominal aortic aneurysm, without rupture: Secondary | ICD-10-CM | POA: Diagnosis not present

## 2014-08-06 DIAGNOSIS — N261 Atrophy of kidney (terminal): Secondary | ICD-10-CM | POA: Diagnosis not present

## 2014-08-06 DIAGNOSIS — Z006 Encounter for examination for normal comparison and control in clinical research program: Secondary | ICD-10-CM | POA: Diagnosis not present

## 2014-08-06 DIAGNOSIS — I739 Peripheral vascular disease, unspecified: Secondary | ICD-10-CM | POA: Diagnosis not present

## 2014-08-06 DIAGNOSIS — E119 Type 2 diabetes mellitus without complications: Secondary | ICD-10-CM | POA: Diagnosis not present

## 2014-08-06 DIAGNOSIS — Z9889 Other specified postprocedural states: Secondary | ICD-10-CM | POA: Diagnosis not present

## 2014-08-14 ENCOUNTER — Other Ambulatory Visit: Payer: Self-pay

## 2014-08-14 NOTE — Patient Outreach (Signed)
[facility]) Care Management  08/14/2014  [name] 21-Jun-1928 [id number]  Telephonic Care Management Note   Referral Date: 07/23/14 Referral Source: PCP: Dr. Gar Ponto, [facility] [phone]  Referral Issue: HTN, Renal Failure, HF  Triage outreach call #2 to patient. Patient reached but states she is not interested in services and not willing to engage in any conversation with RN CM.   RN CM notified [place] Assistant to close case;  Patient refused services.   RN CM rescheduled for next call attempt. [name], RN, BSN, [facility] CCM  [employer] Management Coordinator [phone] Office [phone] Direct [phone] Cell

## 2014-08-18 DIAGNOSIS — Z85828 Personal history of other malignant neoplasm of skin: Secondary | ICD-10-CM | POA: Diagnosis not present

## 2014-08-18 DIAGNOSIS — I872 Venous insufficiency (chronic) (peripheral): Secondary | ICD-10-CM | POA: Diagnosis not present

## 2014-08-20 NOTE — Patient Outreach (Signed)
[facility]) Care Management  08/20/2014  Catherine Rich 05-Dec-1928 [id number]   Notification from [name], RN to close case due to patient refused [place] Management services.  Ronnell Freshwater. [place], [place] Management [place] Assistant Phone: [phone] Fax: [phone]

## 2014-09-01 DIAGNOSIS — H3531 Nonexudative age-related macular degeneration: Secondary | ICD-10-CM | POA: Diagnosis not present

## 2014-09-01 DIAGNOSIS — H3532 Exudative age-related macular degeneration: Secondary | ICD-10-CM | POA: Diagnosis not present

## 2014-09-01 DIAGNOSIS — H35731 Hemorrhagic detachment of retinal pigment epithelium, right eye: Secondary | ICD-10-CM | POA: Diagnosis not present

## 2014-09-01 DIAGNOSIS — H01004 Unspecified blepharitis left upper eyelid: Secondary | ICD-10-CM | POA: Diagnosis not present

## 2014-09-01 DIAGNOSIS — H02834 Dermatochalasis of left upper eyelid: Secondary | ICD-10-CM | POA: Diagnosis not present

## 2014-09-01 DIAGNOSIS — H40003 Preglaucoma, unspecified, bilateral: Secondary | ICD-10-CM | POA: Diagnosis not present

## 2014-09-01 DIAGNOSIS — H02831 Dermatochalasis of right upper eyelid: Secondary | ICD-10-CM | POA: Diagnosis not present

## 2014-09-01 DIAGNOSIS — H01001 Unspecified blepharitis right upper eyelid: Secondary | ICD-10-CM | POA: Diagnosis not present

## 2014-10-07 DIAGNOSIS — H01004 Unspecified blepharitis left upper eyelid: Secondary | ICD-10-CM | POA: Diagnosis not present

## 2014-10-07 DIAGNOSIS — E782 Mixed hyperlipidemia: Secondary | ICD-10-CM | POA: Diagnosis not present

## 2014-10-07 DIAGNOSIS — D500 Iron deficiency anemia secondary to blood loss (chronic): Secondary | ICD-10-CM | POA: Diagnosis not present

## 2014-10-07 DIAGNOSIS — H3531 Nonexudative age-related macular degeneration: Secondary | ICD-10-CM | POA: Diagnosis not present

## 2014-10-07 DIAGNOSIS — H01001 Unspecified blepharitis right upper eyelid: Secondary | ICD-10-CM | POA: Diagnosis not present

## 2014-10-07 DIAGNOSIS — E875 Hyperkalemia: Secondary | ICD-10-CM | POA: Diagnosis not present

## 2014-10-07 DIAGNOSIS — I10 Essential (primary) hypertension: Secondary | ICD-10-CM | POA: Diagnosis not present

## 2014-10-07 DIAGNOSIS — H40003 Preglaucoma, unspecified, bilateral: Secondary | ICD-10-CM | POA: Diagnosis not present

## 2014-10-07 DIAGNOSIS — H3532 Exudative age-related macular degeneration: Secondary | ICD-10-CM | POA: Diagnosis not present

## 2014-10-07 DIAGNOSIS — H02834 Dermatochalasis of left upper eyelid: Secondary | ICD-10-CM | POA: Diagnosis not present

## 2014-10-07 DIAGNOSIS — H35731 Hemorrhagic detachment of retinal pigment epithelium, right eye: Secondary | ICD-10-CM | POA: Diagnosis not present

## 2014-10-07 DIAGNOSIS — H02831 Dermatochalasis of right upper eyelid: Secondary | ICD-10-CM | POA: Diagnosis not present

## 2014-10-07 DIAGNOSIS — E1122 Type 2 diabetes mellitus with diabetic chronic kidney disease: Secondary | ICD-10-CM | POA: Diagnosis not present

## 2014-10-14 DIAGNOSIS — N184 Chronic kidney disease, stage 4 (severe): Secondary | ICD-10-CM | POA: Diagnosis not present

## 2014-10-14 DIAGNOSIS — I10 Essential (primary) hypertension: Secondary | ICD-10-CM | POA: Diagnosis not present

## 2014-10-14 DIAGNOSIS — Z23 Encounter for immunization: Secondary | ICD-10-CM | POA: Diagnosis not present

## 2014-10-14 DIAGNOSIS — F331 Major depressive disorder, recurrent, moderate: Secondary | ICD-10-CM | POA: Diagnosis not present

## 2014-10-14 DIAGNOSIS — D500 Iron deficiency anemia secondary to blood loss (chronic): Secondary | ICD-10-CM | POA: Diagnosis not present

## 2014-10-14 DIAGNOSIS — E782 Mixed hyperlipidemia: Secondary | ICD-10-CM | POA: Diagnosis not present

## 2014-10-14 DIAGNOSIS — E1122 Type 2 diabetes mellitus with diabetic chronic kidney disease: Secondary | ICD-10-CM | POA: Diagnosis not present

## 2014-10-14 DIAGNOSIS — K219 Gastro-esophageal reflux disease without esophagitis: Secondary | ICD-10-CM | POA: Diagnosis not present

## 2014-10-14 DIAGNOSIS — I714 Abdominal aortic aneurysm, without rupture: Secondary | ICD-10-CM | POA: Diagnosis not present

## 2014-10-14 DIAGNOSIS — E039 Hypothyroidism, unspecified: Secondary | ICD-10-CM | POA: Diagnosis not present

## 2014-11-11 DIAGNOSIS — Z6829 Body mass index (BMI) 29.0-29.9, adult: Secondary | ICD-10-CM | POA: Diagnosis not present

## 2014-11-11 DIAGNOSIS — Z09 Encounter for follow-up examination after completed treatment for conditions other than malignant neoplasm: Secondary | ICD-10-CM | POA: Diagnosis not present

## 2014-11-11 DIAGNOSIS — I716 Thoracoabdominal aortic aneurysm, without rupture: Secondary | ICD-10-CM | POA: Diagnosis not present

## 2014-11-11 DIAGNOSIS — Z006 Encounter for examination for normal comparison and control in clinical research program: Secondary | ICD-10-CM | POA: Diagnosis not present

## 2014-11-12 DIAGNOSIS — H01004 Unspecified blepharitis left upper eyelid: Secondary | ICD-10-CM | POA: Diagnosis not present

## 2014-11-12 DIAGNOSIS — H01001 Unspecified blepharitis right upper eyelid: Secondary | ICD-10-CM | POA: Diagnosis not present

## 2014-11-12 DIAGNOSIS — H02834 Dermatochalasis of left upper eyelid: Secondary | ICD-10-CM | POA: Diagnosis not present

## 2014-11-12 DIAGNOSIS — H3531 Nonexudative age-related macular degeneration: Secondary | ICD-10-CM | POA: Diagnosis not present

## 2014-11-12 DIAGNOSIS — H3532 Exudative age-related macular degeneration: Secondary | ICD-10-CM | POA: Diagnosis not present

## 2014-11-12 DIAGNOSIS — H40003 Preglaucoma, unspecified, bilateral: Secondary | ICD-10-CM | POA: Diagnosis not present

## 2014-11-12 DIAGNOSIS — H02831 Dermatochalasis of right upper eyelid: Secondary | ICD-10-CM | POA: Diagnosis not present

## 2014-11-12 DIAGNOSIS — H35731 Hemorrhagic detachment of retinal pigment epithelium, right eye: Secondary | ICD-10-CM | POA: Diagnosis not present

## 2014-12-10 DIAGNOSIS — H40003 Preglaucoma, unspecified, bilateral: Secondary | ICD-10-CM | POA: Diagnosis not present

## 2014-12-10 DIAGNOSIS — H01001 Unspecified blepharitis right upper eyelid: Secondary | ICD-10-CM | POA: Diagnosis not present

## 2014-12-10 DIAGNOSIS — H02831 Dermatochalasis of right upper eyelid: Secondary | ICD-10-CM | POA: Diagnosis not present

## 2014-12-10 DIAGNOSIS — H353121 Nonexudative age-related macular degeneration, left eye, early dry stage: Secondary | ICD-10-CM | POA: Diagnosis not present

## 2014-12-10 DIAGNOSIS — H02834 Dermatochalasis of left upper eyelid: Secondary | ICD-10-CM | POA: Diagnosis not present

## 2014-12-10 DIAGNOSIS — H01004 Unspecified blepharitis left upper eyelid: Secondary | ICD-10-CM | POA: Diagnosis not present

## 2014-12-10 DIAGNOSIS — H35731 Hemorrhagic detachment of retinal pigment epithelium, right eye: Secondary | ICD-10-CM | POA: Diagnosis not present

## 2014-12-10 DIAGNOSIS — H353211 Exudative age-related macular degeneration, right eye, with active choroidal neovascularization: Secondary | ICD-10-CM | POA: Diagnosis not present

## 2014-12-15 DIAGNOSIS — H353121 Nonexudative age-related macular degeneration, left eye, early dry stage: Secondary | ICD-10-CM | POA: Diagnosis not present

## 2014-12-15 DIAGNOSIS — H35731 Hemorrhagic detachment of retinal pigment epithelium, right eye: Secondary | ICD-10-CM | POA: Diagnosis not present

## 2014-12-15 DIAGNOSIS — H01001 Unspecified blepharitis right upper eyelid: Secondary | ICD-10-CM | POA: Diagnosis not present

## 2014-12-15 DIAGNOSIS — H02831 Dermatochalasis of right upper eyelid: Secondary | ICD-10-CM | POA: Diagnosis not present

## 2014-12-15 DIAGNOSIS — H01004 Unspecified blepharitis left upper eyelid: Secondary | ICD-10-CM | POA: Diagnosis not present

## 2014-12-15 DIAGNOSIS — S0501XA Injury of conjunctiva and corneal abrasion without foreign body, right eye, initial encounter: Secondary | ICD-10-CM | POA: Diagnosis not present

## 2014-12-15 DIAGNOSIS — H40003 Preglaucoma, unspecified, bilateral: Secondary | ICD-10-CM | POA: Diagnosis not present

## 2014-12-15 DIAGNOSIS — H353211 Exudative age-related macular degeneration, right eye, with active choroidal neovascularization: Secondary | ICD-10-CM | POA: Diagnosis not present

## 2014-12-15 DIAGNOSIS — H02834 Dermatochalasis of left upper eyelid: Secondary | ICD-10-CM | POA: Diagnosis not present

## 2014-12-22 DIAGNOSIS — N183 Chronic kidney disease, stage 3 (moderate): Secondary | ICD-10-CM | POA: Diagnosis not present

## 2014-12-22 DIAGNOSIS — R42 Dizziness and giddiness: Secondary | ICD-10-CM | POA: Diagnosis not present

## 2014-12-22 DIAGNOSIS — Z882 Allergy status to sulfonamides status: Secondary | ICD-10-CM | POA: Diagnosis not present

## 2014-12-22 DIAGNOSIS — I739 Peripheral vascular disease, unspecified: Secondary | ICD-10-CM | POA: Diagnosis not present

## 2014-12-22 DIAGNOSIS — Z8249 Family history of ischemic heart disease and other diseases of the circulatory system: Secondary | ICD-10-CM | POA: Diagnosis not present

## 2014-12-22 DIAGNOSIS — Z885 Allergy status to narcotic agent status: Secondary | ICD-10-CM | POA: Diagnosis not present

## 2014-12-22 DIAGNOSIS — Z880 Allergy status to penicillin: Secondary | ICD-10-CM | POA: Diagnosis not present

## 2014-12-22 DIAGNOSIS — E785 Hyperlipidemia, unspecified: Secondary | ICD-10-CM | POA: Diagnosis not present

## 2014-12-22 DIAGNOSIS — I129 Hypertensive chronic kidney disease with stage 1 through stage 4 chronic kidney disease, or unspecified chronic kidney disease: Secondary | ICD-10-CM | POA: Diagnosis not present

## 2014-12-22 DIAGNOSIS — Z8261 Family history of arthritis: Secondary | ICD-10-CM | POA: Diagnosis not present

## 2014-12-22 DIAGNOSIS — Z7982 Long term (current) use of aspirin: Secondary | ICD-10-CM | POA: Diagnosis not present

## 2014-12-22 DIAGNOSIS — R51 Headache: Secondary | ICD-10-CM | POA: Diagnosis not present

## 2014-12-22 DIAGNOSIS — Z823 Family history of stroke: Secondary | ICD-10-CM | POA: Diagnosis not present

## 2014-12-22 DIAGNOSIS — K219 Gastro-esophageal reflux disease without esophagitis: Secondary | ICD-10-CM | POA: Diagnosis not present

## 2014-12-22 DIAGNOSIS — R55 Syncope and collapse: Secondary | ICD-10-CM | POA: Diagnosis not present

## 2014-12-22 DIAGNOSIS — E039 Hypothyroidism, unspecified: Secondary | ICD-10-CM | POA: Diagnosis not present

## 2014-12-22 DIAGNOSIS — Z79899 Other long term (current) drug therapy: Secondary | ICD-10-CM | POA: Diagnosis not present

## 2014-12-23 DIAGNOSIS — H8142 Vertigo of central origin, left ear: Secondary | ICD-10-CM | POA: Diagnosis not present

## 2014-12-23 DIAGNOSIS — R42 Dizziness and giddiness: Secondary | ICD-10-CM | POA: Diagnosis not present

## 2014-12-23 DIAGNOSIS — I129 Hypertensive chronic kidney disease with stage 1 through stage 4 chronic kidney disease, or unspecified chronic kidney disease: Secondary | ICD-10-CM | POA: Diagnosis not present

## 2014-12-23 DIAGNOSIS — K219 Gastro-esophageal reflux disease without esophagitis: Secondary | ICD-10-CM | POA: Diagnosis not present

## 2014-12-23 DIAGNOSIS — E785 Hyperlipidemia, unspecified: Secondary | ICD-10-CM | POA: Diagnosis not present

## 2014-12-23 DIAGNOSIS — I739 Peripheral vascular disease, unspecified: Secondary | ICD-10-CM | POA: Diagnosis not present

## 2014-12-23 DIAGNOSIS — N183 Chronic kidney disease, stage 3 (moderate): Secondary | ICD-10-CM | POA: Diagnosis not present

## 2014-12-24 DIAGNOSIS — H40003 Preglaucoma, unspecified, bilateral: Secondary | ICD-10-CM | POA: Diagnosis not present

## 2014-12-24 DIAGNOSIS — H01004 Unspecified blepharitis left upper eyelid: Secondary | ICD-10-CM | POA: Diagnosis not present

## 2014-12-24 DIAGNOSIS — H01001 Unspecified blepharitis right upper eyelid: Secondary | ICD-10-CM | POA: Diagnosis not present

## 2014-12-24 DIAGNOSIS — H35731 Hemorrhagic detachment of retinal pigment epithelium, right eye: Secondary | ICD-10-CM | POA: Diagnosis not present

## 2014-12-24 DIAGNOSIS — H353121 Nonexudative age-related macular degeneration, left eye, early dry stage: Secondary | ICD-10-CM | POA: Diagnosis not present

## 2014-12-24 DIAGNOSIS — H02834 Dermatochalasis of left upper eyelid: Secondary | ICD-10-CM | POA: Diagnosis not present

## 2014-12-24 DIAGNOSIS — S0501XD Injury of conjunctiva and corneal abrasion without foreign body, right eye, subsequent encounter: Secondary | ICD-10-CM | POA: Diagnosis not present

## 2014-12-24 DIAGNOSIS — H353211 Exudative age-related macular degeneration, right eye, with active choroidal neovascularization: Secondary | ICD-10-CM | POA: Diagnosis not present

## 2014-12-24 DIAGNOSIS — H02831 Dermatochalasis of right upper eyelid: Secondary | ICD-10-CM | POA: Diagnosis not present

## 2014-12-28 DIAGNOSIS — M9901 Segmental and somatic dysfunction of cervical region: Secondary | ICD-10-CM | POA: Diagnosis not present

## 2014-12-28 DIAGNOSIS — M5033 Other cervical disc degeneration, cervicothoracic region: Secondary | ICD-10-CM | POA: Diagnosis not present

## 2014-12-29 DIAGNOSIS — M5033 Other cervical disc degeneration, cervicothoracic region: Secondary | ICD-10-CM | POA: Diagnosis not present

## 2014-12-29 DIAGNOSIS — M9901 Segmental and somatic dysfunction of cervical region: Secondary | ICD-10-CM | POA: Diagnosis not present

## 2014-12-30 DIAGNOSIS — M9901 Segmental and somatic dysfunction of cervical region: Secondary | ICD-10-CM | POA: Diagnosis not present

## 2014-12-30 DIAGNOSIS — M5033 Other cervical disc degeneration, cervicothoracic region: Secondary | ICD-10-CM | POA: Diagnosis not present

## 2014-12-31 DIAGNOSIS — M5033 Other cervical disc degeneration, cervicothoracic region: Secondary | ICD-10-CM | POA: Diagnosis not present

## 2014-12-31 DIAGNOSIS — M9901 Segmental and somatic dysfunction of cervical region: Secondary | ICD-10-CM | POA: Diagnosis not present

## 2015-01-01 DIAGNOSIS — H6123 Impacted cerumen, bilateral: Secondary | ICD-10-CM | POA: Diagnosis not present

## 2015-01-01 DIAGNOSIS — R42 Dizziness and giddiness: Secondary | ICD-10-CM | POA: Diagnosis not present

## 2015-01-04 DIAGNOSIS — M9901 Segmental and somatic dysfunction of cervical region: Secondary | ICD-10-CM | POA: Diagnosis not present

## 2015-01-04 DIAGNOSIS — M5033 Other cervical disc degeneration, cervicothoracic region: Secondary | ICD-10-CM | POA: Diagnosis not present

## 2015-01-05 DIAGNOSIS — M9901 Segmental and somatic dysfunction of cervical region: Secondary | ICD-10-CM | POA: Diagnosis not present

## 2015-01-05 DIAGNOSIS — M5033 Other cervical disc degeneration, cervicothoracic region: Secondary | ICD-10-CM | POA: Diagnosis not present

## 2015-01-09 DIAGNOSIS — H353121 Nonexudative age-related macular degeneration, left eye, early dry stage: Secondary | ICD-10-CM | POA: Diagnosis not present

## 2015-01-09 DIAGNOSIS — Z961 Presence of intraocular lens: Secondary | ICD-10-CM | POA: Diagnosis not present

## 2015-01-09 DIAGNOSIS — H353211 Exudative age-related macular degeneration, right eye, with active choroidal neovascularization: Secondary | ICD-10-CM | POA: Diagnosis not present

## 2015-01-09 DIAGNOSIS — H35731 Hemorrhagic detachment of retinal pigment epithelium, right eye: Secondary | ICD-10-CM | POA: Diagnosis not present

## 2015-01-09 DIAGNOSIS — H40003 Preglaucoma, unspecified, bilateral: Secondary | ICD-10-CM | POA: Diagnosis not present

## 2015-01-11 DIAGNOSIS — M5033 Other cervical disc degeneration, cervicothoracic region: Secondary | ICD-10-CM | POA: Diagnosis not present

## 2015-01-11 DIAGNOSIS — M9901 Segmental and somatic dysfunction of cervical region: Secondary | ICD-10-CM | POA: Diagnosis not present

## 2015-01-12 DIAGNOSIS — Z961 Presence of intraocular lens: Secondary | ICD-10-CM | POA: Diagnosis not present

## 2015-01-12 DIAGNOSIS — H35731 Hemorrhagic detachment of retinal pigment epithelium, right eye: Secondary | ICD-10-CM | POA: Diagnosis not present

## 2015-01-12 DIAGNOSIS — H353211 Exudative age-related macular degeneration, right eye, with active choroidal neovascularization: Secondary | ICD-10-CM | POA: Diagnosis not present

## 2015-01-12 DIAGNOSIS — H353121 Nonexudative age-related macular degeneration, left eye, early dry stage: Secondary | ICD-10-CM | POA: Diagnosis not present

## 2015-01-12 DIAGNOSIS — H40003 Preglaucoma, unspecified, bilateral: Secondary | ICD-10-CM | POA: Diagnosis not present

## 2015-01-13 DIAGNOSIS — M9901 Segmental and somatic dysfunction of cervical region: Secondary | ICD-10-CM | POA: Diagnosis not present

## 2015-01-13 DIAGNOSIS — M5033 Other cervical disc degeneration, cervicothoracic region: Secondary | ICD-10-CM | POA: Diagnosis not present

## 2015-01-14 DIAGNOSIS — M9901 Segmental and somatic dysfunction of cervical region: Secondary | ICD-10-CM | POA: Diagnosis not present

## 2015-01-14 DIAGNOSIS — M5033 Other cervical disc degeneration, cervicothoracic region: Secondary | ICD-10-CM | POA: Diagnosis not present

## 2015-01-18 DIAGNOSIS — M5033 Other cervical disc degeneration, cervicothoracic region: Secondary | ICD-10-CM | POA: Diagnosis not present

## 2015-01-18 DIAGNOSIS — M9901 Segmental and somatic dysfunction of cervical region: Secondary | ICD-10-CM | POA: Diagnosis not present

## 2015-01-20 DIAGNOSIS — H353121 Nonexudative age-related macular degeneration, left eye, early dry stage: Secondary | ICD-10-CM | POA: Diagnosis not present

## 2015-01-20 DIAGNOSIS — H35731 Hemorrhagic detachment of retinal pigment epithelium, right eye: Secondary | ICD-10-CM | POA: Diagnosis not present

## 2015-01-20 DIAGNOSIS — H353211 Exudative age-related macular degeneration, right eye, with active choroidal neovascularization: Secondary | ICD-10-CM | POA: Diagnosis not present

## 2015-01-22 DIAGNOSIS — H6123 Impacted cerumen, bilateral: Secondary | ICD-10-CM | POA: Diagnosis not present

## 2015-01-22 DIAGNOSIS — R42 Dizziness and giddiness: Secondary | ICD-10-CM | POA: Diagnosis not present

## 2015-02-02 DIAGNOSIS — Z006 Encounter for examination for normal comparison and control in clinical research program: Secondary | ICD-10-CM | POA: Diagnosis not present

## 2015-02-02 DIAGNOSIS — I701 Atherosclerosis of renal artery: Secondary | ICD-10-CM | POA: Diagnosis not present

## 2015-02-02 DIAGNOSIS — Z09 Encounter for follow-up examination after completed treatment for conditions other than malignant neoplasm: Secondary | ICD-10-CM | POA: Diagnosis not present

## 2015-02-02 DIAGNOSIS — R918 Other nonspecific abnormal finding of lung field: Secondary | ICD-10-CM | POA: Diagnosis not present

## 2015-02-02 DIAGNOSIS — T82330A Leakage of aortic (bifurcation) graft (replacement), initial encounter: Secondary | ICD-10-CM | POA: Diagnosis not present

## 2015-02-02 DIAGNOSIS — I716 Thoracoabdominal aortic aneurysm, without rupture: Secondary | ICD-10-CM | POA: Diagnosis not present

## 2015-02-02 DIAGNOSIS — T82330D Leakage of aortic (bifurcation) graft (replacement), subsequent encounter: Secondary | ICD-10-CM | POA: Diagnosis not present

## 2015-02-02 DIAGNOSIS — N200 Calculus of kidney: Secondary | ICD-10-CM | POA: Diagnosis not present

## 2015-02-02 DIAGNOSIS — Z6829 Body mass index (BMI) 29.0-29.9, adult: Secondary | ICD-10-CM | POA: Diagnosis not present

## 2015-02-13 DIAGNOSIS — J40 Bronchitis, not specified as acute or chronic: Secondary | ICD-10-CM | POA: Diagnosis not present

## 2015-02-13 DIAGNOSIS — R509 Fever, unspecified: Secondary | ICD-10-CM | POA: Diagnosis not present

## 2015-02-17 DIAGNOSIS — H35731 Hemorrhagic detachment of retinal pigment epithelium, right eye: Secondary | ICD-10-CM | POA: Diagnosis not present

## 2015-02-17 DIAGNOSIS — H353211 Exudative age-related macular degeneration, right eye, with active choroidal neovascularization: Secondary | ICD-10-CM | POA: Diagnosis not present

## 2015-02-25 DIAGNOSIS — E782 Mixed hyperlipidemia: Secondary | ICD-10-CM | POA: Diagnosis not present

## 2015-02-25 DIAGNOSIS — E875 Hyperkalemia: Secondary | ICD-10-CM | POA: Diagnosis not present

## 2015-02-25 DIAGNOSIS — E1122 Type 2 diabetes mellitus with diabetic chronic kidney disease: Secondary | ICD-10-CM | POA: Diagnosis not present

## 2015-02-25 DIAGNOSIS — D539 Nutritional anemia, unspecified: Secondary | ICD-10-CM | POA: Diagnosis not present

## 2015-02-25 DIAGNOSIS — K210 Gastro-esophageal reflux disease with esophagitis: Secondary | ICD-10-CM | POA: Diagnosis not present

## 2015-02-25 DIAGNOSIS — D500 Iron deficiency anemia secondary to blood loss (chronic): Secondary | ICD-10-CM | POA: Diagnosis not present

## 2015-03-01 DIAGNOSIS — E1122 Type 2 diabetes mellitus with diabetic chronic kidney disease: Secondary | ICD-10-CM | POA: Diagnosis not present

## 2015-03-01 DIAGNOSIS — E782 Mixed hyperlipidemia: Secondary | ICD-10-CM | POA: Diagnosis not present

## 2015-03-01 DIAGNOSIS — D500 Iron deficiency anemia secondary to blood loss (chronic): Secondary | ICD-10-CM | POA: Diagnosis not present

## 2015-03-01 DIAGNOSIS — I10 Essential (primary) hypertension: Secondary | ICD-10-CM | POA: Diagnosis not present

## 2015-03-01 DIAGNOSIS — K219 Gastro-esophageal reflux disease without esophagitis: Secondary | ICD-10-CM | POA: Diagnosis not present

## 2015-03-01 DIAGNOSIS — N184 Chronic kidney disease, stage 4 (severe): Secondary | ICD-10-CM | POA: Diagnosis not present

## 2015-03-01 DIAGNOSIS — E039 Hypothyroidism, unspecified: Secondary | ICD-10-CM | POA: Diagnosis not present

## 2015-03-01 DIAGNOSIS — F331 Major depressive disorder, recurrent, moderate: Secondary | ICD-10-CM | POA: Diagnosis not present

## 2015-03-01 DIAGNOSIS — I714 Abdominal aortic aneurysm, without rupture: Secondary | ICD-10-CM | POA: Diagnosis not present

## 2015-03-03 ENCOUNTER — Other Ambulatory Visit (INDEPENDENT_AMBULATORY_CARE_PROVIDER_SITE_OTHER): Payer: Self-pay | Admitting: Otolaryngology

## 2015-03-03 DIAGNOSIS — H9191 Unspecified hearing loss, right ear: Secondary | ICD-10-CM

## 2015-03-03 DIAGNOSIS — H6123 Impacted cerumen, bilateral: Secondary | ICD-10-CM | POA: Diagnosis not present

## 2015-03-03 DIAGNOSIS — H9121 Sudden idiopathic hearing loss, right ear: Secondary | ICD-10-CM | POA: Diagnosis not present

## 2015-03-03 DIAGNOSIS — H8301 Labyrinthitis, right ear: Secondary | ICD-10-CM | POA: Diagnosis not present

## 2015-03-03 DIAGNOSIS — H9041 Sensorineural hearing loss, unilateral, right ear, with unrestricted hearing on the contralateral side: Secondary | ICD-10-CM | POA: Diagnosis not present

## 2015-03-03 DIAGNOSIS — R42 Dizziness and giddiness: Secondary | ICD-10-CM | POA: Diagnosis not present

## 2015-03-15 DIAGNOSIS — Z885 Allergy status to narcotic agent status: Secondary | ICD-10-CM | POA: Diagnosis not present

## 2015-03-15 DIAGNOSIS — Z8673 Personal history of transient ischemic attack (TIA), and cerebral infarction without residual deficits: Secondary | ICD-10-CM | POA: Diagnosis not present

## 2015-03-15 DIAGNOSIS — Z882 Allergy status to sulfonamides status: Secondary | ICD-10-CM | POA: Diagnosis not present

## 2015-03-15 DIAGNOSIS — E039 Hypothyroidism, unspecified: Secondary | ICD-10-CM | POA: Diagnosis not present

## 2015-03-15 DIAGNOSIS — I739 Peripheral vascular disease, unspecified: Secondary | ICD-10-CM | POA: Diagnosis not present

## 2015-03-15 DIAGNOSIS — I712 Thoracic aortic aneurysm, without rupture: Secondary | ICD-10-CM | POA: Diagnosis not present

## 2015-03-15 DIAGNOSIS — E119 Type 2 diabetes mellitus without complications: Secondary | ICD-10-CM | POA: Diagnosis not present

## 2015-03-15 DIAGNOSIS — Z880 Allergy status to penicillin: Secondary | ICD-10-CM | POA: Diagnosis not present

## 2015-03-15 DIAGNOSIS — I10 Essential (primary) hypertension: Secondary | ICD-10-CM | POA: Diagnosis not present

## 2015-03-15 DIAGNOSIS — T82330A Leakage of aortic (bifurcation) graft (replacement), initial encounter: Secondary | ICD-10-CM | POA: Diagnosis not present

## 2015-03-17 DIAGNOSIS — G8918 Other acute postprocedural pain: Secondary | ICD-10-CM | POA: Diagnosis not present

## 2015-03-17 DIAGNOSIS — I716 Thoracoabdominal aortic aneurysm, without rupture: Secondary | ICD-10-CM | POA: Diagnosis not present

## 2015-03-17 DIAGNOSIS — Z79899 Other long term (current) drug therapy: Secondary | ICD-10-CM | POA: Diagnosis not present

## 2015-03-17 DIAGNOSIS — Z7982 Long term (current) use of aspirin: Secondary | ICD-10-CM | POA: Diagnosis not present

## 2015-03-17 DIAGNOSIS — Z90710 Acquired absence of both cervix and uterus: Secondary | ICD-10-CM | POA: Diagnosis not present

## 2015-03-17 DIAGNOSIS — M62838 Other muscle spasm: Secondary | ICD-10-CM | POA: Diagnosis not present

## 2015-03-17 DIAGNOSIS — I10 Essential (primary) hypertension: Secondary | ICD-10-CM | POA: Diagnosis not present

## 2015-03-17 DIAGNOSIS — R109 Unspecified abdominal pain: Secondary | ICD-10-CM | POA: Diagnosis not present

## 2015-03-17 DIAGNOSIS — E039 Hypothyroidism, unspecified: Secondary | ICD-10-CM | POA: Diagnosis not present

## 2015-03-17 DIAGNOSIS — M549 Dorsalgia, unspecified: Secondary | ICD-10-CM | POA: Diagnosis not present

## 2015-03-17 DIAGNOSIS — I719 Aortic aneurysm of unspecified site, without rupture: Secondary | ICD-10-CM | POA: Diagnosis not present

## 2015-03-17 DIAGNOSIS — N200 Calculus of kidney: Secondary | ICD-10-CM | POA: Diagnosis not present

## 2015-03-17 DIAGNOSIS — M545 Low back pain: Secondary | ICD-10-CM | POA: Diagnosis not present

## 2015-03-17 DIAGNOSIS — Z885 Allergy status to narcotic agent status: Secondary | ICD-10-CM | POA: Diagnosis not present

## 2015-03-17 DIAGNOSIS — E119 Type 2 diabetes mellitus without complications: Secondary | ICD-10-CM | POA: Diagnosis not present

## 2015-03-17 DIAGNOSIS — I739 Peripheral vascular disease, unspecified: Secondary | ICD-10-CM | POA: Diagnosis not present

## 2015-03-17 DIAGNOSIS — I771 Stricture of artery: Secondary | ICD-10-CM | POA: Diagnosis not present

## 2015-03-17 DIAGNOSIS — K5730 Diverticulosis of large intestine without perforation or abscess without bleeding: Secondary | ICD-10-CM | POA: Diagnosis not present

## 2015-03-17 DIAGNOSIS — R911 Solitary pulmonary nodule: Secondary | ICD-10-CM | POA: Diagnosis not present

## 2015-03-17 DIAGNOSIS — Z87891 Personal history of nicotine dependence: Secondary | ICD-10-CM | POA: Diagnosis not present

## 2015-03-17 DIAGNOSIS — Z8673 Personal history of transient ischemic attack (TIA), and cerebral infarction without residual deficits: Secondary | ICD-10-CM | POA: Diagnosis not present

## 2015-03-18 ENCOUNTER — Other Ambulatory Visit (HOSPITAL_COMMUNITY): Payer: Medicare Other

## 2015-03-18 ENCOUNTER — Ambulatory Visit (INDEPENDENT_AMBULATORY_CARE_PROVIDER_SITE_OTHER): Payer: Medicare Other | Admitting: Otolaryngology

## 2015-03-18 DIAGNOSIS — H9041 Sensorineural hearing loss, unilateral, right ear, with unrestricted hearing on the contralateral side: Secondary | ICD-10-CM | POA: Diagnosis not present

## 2015-03-18 DIAGNOSIS — H8303 Labyrinthitis, bilateral: Secondary | ICD-10-CM

## 2015-03-25 DIAGNOSIS — H353211 Exudative age-related macular degeneration, right eye, with active choroidal neovascularization: Secondary | ICD-10-CM | POA: Diagnosis not present

## 2015-03-25 DIAGNOSIS — H35731 Hemorrhagic detachment of retinal pigment epithelium, right eye: Secondary | ICD-10-CM | POA: Diagnosis not present

## 2015-03-26 DIAGNOSIS — J029 Acute pharyngitis, unspecified: Secondary | ICD-10-CM | POA: Diagnosis not present

## 2015-03-26 DIAGNOSIS — I714 Abdominal aortic aneurysm, without rupture: Secondary | ICD-10-CM | POA: Diagnosis not present

## 2015-03-26 DIAGNOSIS — R509 Fever, unspecified: Secondary | ICD-10-CM | POA: Diagnosis not present

## 2015-03-26 DIAGNOSIS — J40 Bronchitis, not specified as acute or chronic: Secondary | ICD-10-CM | POA: Diagnosis not present

## 2015-04-16 DIAGNOSIS — B029 Zoster without complications: Secondary | ICD-10-CM | POA: Diagnosis not present

## 2015-04-28 DIAGNOSIS — H35731 Hemorrhagic detachment of retinal pigment epithelium, right eye: Secondary | ICD-10-CM | POA: Diagnosis not present

## 2015-04-28 DIAGNOSIS — H353211 Exudative age-related macular degeneration, right eye, with active choroidal neovascularization: Secondary | ICD-10-CM | POA: Diagnosis not present

## 2015-05-04 DIAGNOSIS — H53002 Unspecified amblyopia, left eye: Secondary | ICD-10-CM | POA: Diagnosis not present

## 2015-05-04 DIAGNOSIS — H353232 Exudative age-related macular degeneration, bilateral, with inactive choroidal neovascularization: Secondary | ICD-10-CM | POA: Diagnosis not present

## 2015-05-04 DIAGNOSIS — H01004 Unspecified blepharitis left upper eyelid: Secondary | ICD-10-CM | POA: Diagnosis not present

## 2015-05-04 DIAGNOSIS — H01001 Unspecified blepharitis right upper eyelid: Secondary | ICD-10-CM | POA: Diagnosis not present

## 2015-05-04 DIAGNOSIS — H02834 Dermatochalasis of left upper eyelid: Secondary | ICD-10-CM | POA: Diagnosis not present

## 2015-05-04 DIAGNOSIS — H527 Unspecified disorder of refraction: Secondary | ICD-10-CM | POA: Diagnosis not present

## 2015-05-04 DIAGNOSIS — H35731 Hemorrhagic detachment of retinal pigment epithelium, right eye: Secondary | ICD-10-CM | POA: Diagnosis not present

## 2015-05-04 DIAGNOSIS — H40003 Preglaucoma, unspecified, bilateral: Secondary | ICD-10-CM | POA: Diagnosis not present

## 2015-05-04 DIAGNOSIS — S0501XD Injury of conjunctiva and corneal abrasion without foreign body, right eye, subsequent encounter: Secondary | ICD-10-CM | POA: Diagnosis not present

## 2015-05-04 DIAGNOSIS — H02831 Dermatochalasis of right upper eyelid: Secondary | ICD-10-CM | POA: Diagnosis not present

## 2015-05-11 DIAGNOSIS — M545 Low back pain: Secondary | ICD-10-CM | POA: Diagnosis not present

## 2015-05-11 DIAGNOSIS — D500 Iron deficiency anemia secondary to blood loss (chronic): Secondary | ICD-10-CM | POA: Diagnosis not present

## 2015-05-13 DIAGNOSIS — H9313 Tinnitus, bilateral: Secondary | ICD-10-CM | POA: Diagnosis not present

## 2015-05-13 DIAGNOSIS — D631 Anemia in chronic kidney disease: Secondary | ICD-10-CM | POA: Diagnosis not present

## 2015-05-13 DIAGNOSIS — K210 Gastro-esophageal reflux disease with esophagitis: Secondary | ICD-10-CM | POA: Diagnosis not present

## 2015-05-26 DIAGNOSIS — H353211 Exudative age-related macular degeneration, right eye, with active choroidal neovascularization: Secondary | ICD-10-CM | POA: Diagnosis not present

## 2015-05-26 DIAGNOSIS — H35731 Hemorrhagic detachment of retinal pigment epithelium, right eye: Secondary | ICD-10-CM | POA: Diagnosis not present

## 2015-05-27 DIAGNOSIS — R768 Other specified abnormal immunological findings in serum: Secondary | ICD-10-CM | POA: Diagnosis not present

## 2015-06-11 DIAGNOSIS — D500 Iron deficiency anemia secondary to blood loss (chronic): Secondary | ICD-10-CM | POA: Diagnosis not present

## 2015-06-11 DIAGNOSIS — E039 Hypothyroidism, unspecified: Secondary | ICD-10-CM | POA: Diagnosis not present

## 2015-06-11 DIAGNOSIS — E875 Hyperkalemia: Secondary | ICD-10-CM | POA: Diagnosis not present

## 2015-06-11 DIAGNOSIS — D519 Vitamin B12 deficiency anemia, unspecified: Secondary | ICD-10-CM | POA: Diagnosis not present

## 2015-06-11 DIAGNOSIS — E782 Mixed hyperlipidemia: Secondary | ICD-10-CM | POA: Diagnosis not present

## 2015-06-11 DIAGNOSIS — E1122 Type 2 diabetes mellitus with diabetic chronic kidney disease: Secondary | ICD-10-CM | POA: Diagnosis not present

## 2015-06-11 DIAGNOSIS — D631 Anemia in chronic kidney disease: Secondary | ICD-10-CM | POA: Diagnosis not present

## 2015-06-14 DIAGNOSIS — D500 Iron deficiency anemia secondary to blood loss (chronic): Secondary | ICD-10-CM | POA: Diagnosis not present

## 2015-06-14 DIAGNOSIS — N184 Chronic kidney disease, stage 4 (severe): Secondary | ICD-10-CM | POA: Diagnosis not present

## 2015-06-14 DIAGNOSIS — I714 Abdominal aortic aneurysm, without rupture: Secondary | ICD-10-CM | POA: Diagnosis not present

## 2015-06-14 DIAGNOSIS — E1122 Type 2 diabetes mellitus with diabetic chronic kidney disease: Secondary | ICD-10-CM | POA: Diagnosis not present

## 2015-06-14 DIAGNOSIS — K219 Gastro-esophageal reflux disease without esophagitis: Secondary | ICD-10-CM | POA: Diagnosis not present

## 2015-06-14 DIAGNOSIS — E782 Mixed hyperlipidemia: Secondary | ICD-10-CM | POA: Diagnosis not present

## 2015-06-14 DIAGNOSIS — F331 Major depressive disorder, recurrent, moderate: Secondary | ICD-10-CM | POA: Diagnosis not present

## 2015-06-14 DIAGNOSIS — E039 Hypothyroidism, unspecified: Secondary | ICD-10-CM | POA: Diagnosis not present

## 2015-06-29 DIAGNOSIS — H35731 Hemorrhagic detachment of retinal pigment epithelium, right eye: Secondary | ICD-10-CM | POA: Diagnosis not present

## 2015-06-29 DIAGNOSIS — H353211 Exudative age-related macular degeneration, right eye, with active choroidal neovascularization: Secondary | ICD-10-CM | POA: Diagnosis not present

## 2015-07-19 DIAGNOSIS — M545 Low back pain: Secondary | ICD-10-CM | POA: Diagnosis not present

## 2015-07-22 DIAGNOSIS — K5730 Diverticulosis of large intestine without perforation or abscess without bleeding: Secondary | ICD-10-CM | POA: Diagnosis not present

## 2015-07-22 DIAGNOSIS — J9811 Atelectasis: Secondary | ICD-10-CM | POA: Diagnosis not present

## 2015-07-22 DIAGNOSIS — T82330A Leakage of aortic (bifurcation) graft (replacement), initial encounter: Secondary | ICD-10-CM | POA: Diagnosis not present

## 2015-07-22 DIAGNOSIS — I716 Thoracoabdominal aortic aneurysm, without rupture: Secondary | ICD-10-CM | POA: Diagnosis not present

## 2015-07-22 DIAGNOSIS — R911 Solitary pulmonary nodule: Secondary | ICD-10-CM | POA: Diagnosis not present

## 2015-07-22 DIAGNOSIS — Z6828 Body mass index (BMI) 28.0-28.9, adult: Secondary | ICD-10-CM | POA: Diagnosis not present

## 2015-07-22 DIAGNOSIS — Z006 Encounter for examination for normal comparison and control in clinical research program: Secondary | ICD-10-CM | POA: Diagnosis not present

## 2015-07-22 DIAGNOSIS — I712 Thoracic aortic aneurysm, without rupture: Secondary | ICD-10-CM | POA: Diagnosis not present

## 2015-07-22 DIAGNOSIS — N261 Atrophy of kidney (terminal): Secondary | ICD-10-CM | POA: Diagnosis not present

## 2015-07-28 DIAGNOSIS — E1122 Type 2 diabetes mellitus with diabetic chronic kidney disease: Secondary | ICD-10-CM | POA: Diagnosis not present

## 2015-07-28 DIAGNOSIS — I10 Essential (primary) hypertension: Secondary | ICD-10-CM | POA: Diagnosis not present

## 2015-07-28 DIAGNOSIS — N184 Chronic kidney disease, stage 4 (severe): Secondary | ICD-10-CM | POA: Diagnosis not present

## 2015-07-28 DIAGNOSIS — D529 Folate deficiency anemia, unspecified: Secondary | ICD-10-CM | POA: Diagnosis not present

## 2015-07-28 DIAGNOSIS — E039 Hypothyroidism, unspecified: Secondary | ICD-10-CM | POA: Diagnosis not present

## 2015-07-28 DIAGNOSIS — J219 Acute bronchiolitis, unspecified: Secondary | ICD-10-CM | POA: Diagnosis not present

## 2015-07-28 DIAGNOSIS — K219 Gastro-esophageal reflux disease without esophagitis: Secondary | ICD-10-CM | POA: Diagnosis not present

## 2015-07-28 DIAGNOSIS — D511 Vitamin B12 deficiency anemia due to selective vitamin B12 malabsorption with proteinuria: Secondary | ICD-10-CM | POA: Diagnosis not present

## 2015-07-28 DIAGNOSIS — E782 Mixed hyperlipidemia: Secondary | ICD-10-CM | POA: Diagnosis not present

## 2015-07-28 DIAGNOSIS — E875 Hyperkalemia: Secondary | ICD-10-CM | POA: Diagnosis not present

## 2015-08-06 DIAGNOSIS — M4806 Spinal stenosis, lumbar region: Secondary | ICD-10-CM | POA: Diagnosis not present

## 2015-08-06 DIAGNOSIS — M5126 Other intervertebral disc displacement, lumbar region: Secondary | ICD-10-CM | POA: Diagnosis not present

## 2015-08-06 DIAGNOSIS — M47816 Spondylosis without myelopathy or radiculopathy, lumbar region: Secondary | ICD-10-CM | POA: Diagnosis not present

## 2015-08-18 DIAGNOSIS — Z85828 Personal history of other malignant neoplasm of skin: Secondary | ICD-10-CM | POA: Diagnosis not present

## 2015-08-18 DIAGNOSIS — L570 Actinic keratosis: Secondary | ICD-10-CM | POA: Diagnosis not present

## 2015-08-19 DIAGNOSIS — H353211 Exudative age-related macular degeneration, right eye, with active choroidal neovascularization: Secondary | ICD-10-CM | POA: Diagnosis not present

## 2015-08-19 DIAGNOSIS — H353120 Nonexudative age-related macular degeneration, left eye, stage unspecified: Secondary | ICD-10-CM | POA: Diagnosis not present

## 2015-09-03 DIAGNOSIS — I10 Essential (primary) hypertension: Secondary | ICD-10-CM | POA: Diagnosis not present

## 2015-09-03 DIAGNOSIS — Z6832 Body mass index (BMI) 32.0-32.9, adult: Secondary | ICD-10-CM | POA: Diagnosis not present

## 2015-09-03 DIAGNOSIS — M549 Dorsalgia, unspecified: Secondary | ICD-10-CM | POA: Diagnosis not present

## 2015-09-03 DIAGNOSIS — M5416 Radiculopathy, lumbar region: Secondary | ICD-10-CM | POA: Diagnosis not present

## 2015-09-03 DIAGNOSIS — M546 Pain in thoracic spine: Secondary | ICD-10-CM | POA: Diagnosis not present

## 2015-09-03 DIAGNOSIS — M4316 Spondylolisthesis, lumbar region: Secondary | ICD-10-CM | POA: Diagnosis not present

## 2015-09-03 DIAGNOSIS — R1030 Lower abdominal pain, unspecified: Secondary | ICD-10-CM | POA: Diagnosis not present

## 2015-09-21 DIAGNOSIS — H353211 Exudative age-related macular degeneration, right eye, with active choroidal neovascularization: Secondary | ICD-10-CM | POA: Diagnosis not present

## 2015-09-21 DIAGNOSIS — H35731 Hemorrhagic detachment of retinal pigment epithelium, right eye: Secondary | ICD-10-CM | POA: Diagnosis not present

## 2015-10-14 DIAGNOSIS — M545 Low back pain: Secondary | ICD-10-CM | POA: Diagnosis not present

## 2015-10-14 DIAGNOSIS — M549 Dorsalgia, unspecified: Secondary | ICD-10-CM | POA: Diagnosis not present

## 2015-10-14 DIAGNOSIS — M5416 Radiculopathy, lumbar region: Secondary | ICD-10-CM | POA: Diagnosis not present

## 2015-10-14 DIAGNOSIS — Z6831 Body mass index (BMI) 31.0-31.9, adult: Secondary | ICD-10-CM | POA: Diagnosis not present

## 2015-10-14 DIAGNOSIS — G8929 Other chronic pain: Secondary | ICD-10-CM | POA: Diagnosis not present

## 2015-10-15 DIAGNOSIS — R2689 Other abnormalities of gait and mobility: Secondary | ICD-10-CM | POA: Diagnosis not present

## 2015-10-15 DIAGNOSIS — M545 Low back pain: Secondary | ICD-10-CM | POA: Diagnosis not present

## 2015-10-20 DIAGNOSIS — R2689 Other abnormalities of gait and mobility: Secondary | ICD-10-CM | POA: Diagnosis not present

## 2015-10-20 DIAGNOSIS — M545 Low back pain: Secondary | ICD-10-CM | POA: Diagnosis not present

## 2015-10-22 DIAGNOSIS — R2689 Other abnormalities of gait and mobility: Secondary | ICD-10-CM | POA: Diagnosis not present

## 2015-10-22 DIAGNOSIS — M545 Low back pain: Secondary | ICD-10-CM | POA: Diagnosis not present

## 2015-10-25 DIAGNOSIS — R2689 Other abnormalities of gait and mobility: Secondary | ICD-10-CM | POA: Diagnosis not present

## 2015-10-25 DIAGNOSIS — M545 Low back pain: Secondary | ICD-10-CM | POA: Diagnosis not present

## 2015-10-26 DIAGNOSIS — H353221 Exudative age-related macular degeneration, left eye, with active choroidal neovascularization: Secondary | ICD-10-CM | POA: Diagnosis not present

## 2015-10-27 DIAGNOSIS — H353211 Exudative age-related macular degeneration, right eye, with active choroidal neovascularization: Secondary | ICD-10-CM | POA: Diagnosis not present

## 2015-10-27 DIAGNOSIS — R2689 Other abnormalities of gait and mobility: Secondary | ICD-10-CM | POA: Diagnosis not present

## 2015-10-27 DIAGNOSIS — H35731 Hemorrhagic detachment of retinal pigment epithelium, right eye: Secondary | ICD-10-CM | POA: Diagnosis not present

## 2015-10-27 DIAGNOSIS — M545 Low back pain: Secondary | ICD-10-CM | POA: Diagnosis not present

## 2015-10-29 DIAGNOSIS — R2689 Other abnormalities of gait and mobility: Secondary | ICD-10-CM | POA: Diagnosis not present

## 2015-10-29 DIAGNOSIS — M545 Low back pain: Secondary | ICD-10-CM | POA: Diagnosis not present

## 2015-10-29 DIAGNOSIS — N184 Chronic kidney disease, stage 4 (severe): Secondary | ICD-10-CM | POA: Diagnosis not present

## 2015-10-29 DIAGNOSIS — E782 Mixed hyperlipidemia: Secondary | ICD-10-CM | POA: Diagnosis not present

## 2015-10-29 DIAGNOSIS — D631 Anemia in chronic kidney disease: Secondary | ICD-10-CM | POA: Diagnosis not present

## 2015-10-29 DIAGNOSIS — E538 Deficiency of other specified B group vitamins: Secondary | ICD-10-CM | POA: Diagnosis not present

## 2015-10-29 DIAGNOSIS — D500 Iron deficiency anemia secondary to blood loss (chronic): Secondary | ICD-10-CM | POA: Diagnosis not present

## 2015-10-29 DIAGNOSIS — E039 Hypothyroidism, unspecified: Secondary | ICD-10-CM | POA: Diagnosis not present

## 2015-10-29 DIAGNOSIS — E1122 Type 2 diabetes mellitus with diabetic chronic kidney disease: Secondary | ICD-10-CM | POA: Diagnosis not present

## 2015-10-29 DIAGNOSIS — I10 Essential (primary) hypertension: Secondary | ICD-10-CM | POA: Diagnosis not present

## 2015-10-29 DIAGNOSIS — Z9189 Other specified personal risk factors, not elsewhere classified: Secondary | ICD-10-CM | POA: Diagnosis not present

## 2015-10-29 DIAGNOSIS — E875 Hyperkalemia: Secondary | ICD-10-CM | POA: Diagnosis not present

## 2015-11-01 DIAGNOSIS — R2689 Other abnormalities of gait and mobility: Secondary | ICD-10-CM | POA: Diagnosis not present

## 2015-11-01 DIAGNOSIS — M545 Low back pain: Secondary | ICD-10-CM | POA: Diagnosis not present

## 2015-11-02 DIAGNOSIS — E1122 Type 2 diabetes mellitus with diabetic chronic kidney disease: Secondary | ICD-10-CM | POA: Diagnosis not present

## 2015-11-02 DIAGNOSIS — E782 Mixed hyperlipidemia: Secondary | ICD-10-CM | POA: Diagnosis not present

## 2015-11-02 DIAGNOSIS — I10 Essential (primary) hypertension: Secondary | ICD-10-CM | POA: Diagnosis not present

## 2015-11-02 DIAGNOSIS — Z23 Encounter for immunization: Secondary | ICD-10-CM | POA: Diagnosis not present

## 2015-11-02 DIAGNOSIS — F331 Major depressive disorder, recurrent, moderate: Secondary | ICD-10-CM | POA: Diagnosis not present

## 2015-11-02 DIAGNOSIS — E039 Hypothyroidism, unspecified: Secondary | ICD-10-CM | POA: Diagnosis not present

## 2015-11-02 DIAGNOSIS — Z1212 Encounter for screening for malignant neoplasm of rectum: Secondary | ICD-10-CM | POA: Diagnosis not present

## 2015-11-02 DIAGNOSIS — D500 Iron deficiency anemia secondary to blood loss (chronic): Secondary | ICD-10-CM | POA: Diagnosis not present

## 2015-11-03 DIAGNOSIS — M545 Low back pain: Secondary | ICD-10-CM | POA: Diagnosis not present

## 2015-11-03 DIAGNOSIS — R2689 Other abnormalities of gait and mobility: Secondary | ICD-10-CM | POA: Diagnosis not present

## 2015-11-05 DIAGNOSIS — M545 Low back pain: Secondary | ICD-10-CM | POA: Diagnosis not present

## 2015-11-05 DIAGNOSIS — R2689 Other abnormalities of gait and mobility: Secondary | ICD-10-CM | POA: Diagnosis not present

## 2015-11-08 DIAGNOSIS — M545 Low back pain: Secondary | ICD-10-CM | POA: Diagnosis not present

## 2015-11-08 DIAGNOSIS — R2689 Other abnormalities of gait and mobility: Secondary | ICD-10-CM | POA: Diagnosis not present

## 2015-11-10 DIAGNOSIS — M545 Low back pain: Secondary | ICD-10-CM | POA: Diagnosis not present

## 2015-11-10 DIAGNOSIS — R2689 Other abnormalities of gait and mobility: Secondary | ICD-10-CM | POA: Diagnosis not present

## 2015-11-11 DIAGNOSIS — R2689 Other abnormalities of gait and mobility: Secondary | ICD-10-CM | POA: Diagnosis not present

## 2015-11-11 DIAGNOSIS — M545 Low back pain: Secondary | ICD-10-CM | POA: Diagnosis not present

## 2015-11-12 DIAGNOSIS — M545 Low back pain: Secondary | ICD-10-CM | POA: Diagnosis not present

## 2015-11-12 DIAGNOSIS — R2689 Other abnormalities of gait and mobility: Secondary | ICD-10-CM | POA: Diagnosis not present

## 2015-11-15 DIAGNOSIS — M545 Low back pain: Secondary | ICD-10-CM | POA: Diagnosis not present

## 2015-11-15 DIAGNOSIS — R2689 Other abnormalities of gait and mobility: Secondary | ICD-10-CM | POA: Diagnosis not present

## 2015-11-17 DIAGNOSIS — R2689 Other abnormalities of gait and mobility: Secondary | ICD-10-CM | POA: Diagnosis not present

## 2015-11-17 DIAGNOSIS — M545 Low back pain: Secondary | ICD-10-CM | POA: Diagnosis not present

## 2015-11-19 DIAGNOSIS — M545 Low back pain: Secondary | ICD-10-CM | POA: Diagnosis not present

## 2015-11-19 DIAGNOSIS — R2689 Other abnormalities of gait and mobility: Secondary | ICD-10-CM | POA: Diagnosis not present

## 2015-11-22 DIAGNOSIS — R2689 Other abnormalities of gait and mobility: Secondary | ICD-10-CM | POA: Diagnosis not present

## 2015-11-22 DIAGNOSIS — M545 Low back pain: Secondary | ICD-10-CM | POA: Diagnosis not present

## 2015-11-24 DIAGNOSIS — M545 Low back pain: Secondary | ICD-10-CM | POA: Diagnosis not present

## 2015-11-24 DIAGNOSIS — R2689 Other abnormalities of gait and mobility: Secondary | ICD-10-CM | POA: Diagnosis not present

## 2015-11-26 DIAGNOSIS — M545 Low back pain: Secondary | ICD-10-CM | POA: Diagnosis not present

## 2015-11-26 DIAGNOSIS — R2689 Other abnormalities of gait and mobility: Secondary | ICD-10-CM | POA: Diagnosis not present

## 2015-11-29 DIAGNOSIS — R2689 Other abnormalities of gait and mobility: Secondary | ICD-10-CM | POA: Diagnosis not present

## 2015-11-29 DIAGNOSIS — M545 Low back pain: Secondary | ICD-10-CM | POA: Diagnosis not present

## 2015-11-30 DIAGNOSIS — H353211 Exudative age-related macular degeneration, right eye, with active choroidal neovascularization: Secondary | ICD-10-CM | POA: Diagnosis not present

## 2015-11-30 DIAGNOSIS — H353121 Nonexudative age-related macular degeneration, left eye, early dry stage: Secondary | ICD-10-CM | POA: Diagnosis not present

## 2015-11-30 DIAGNOSIS — H35731 Hemorrhagic detachment of retinal pigment epithelium, right eye: Secondary | ICD-10-CM | POA: Diagnosis not present

## 2015-12-01 DIAGNOSIS — R2689 Other abnormalities of gait and mobility: Secondary | ICD-10-CM | POA: Diagnosis not present

## 2015-12-01 DIAGNOSIS — M545 Low back pain: Secondary | ICD-10-CM | POA: Diagnosis not present

## 2015-12-07 DIAGNOSIS — H353221 Exudative age-related macular degeneration, left eye, with active choroidal neovascularization: Secondary | ICD-10-CM | POA: Diagnosis not present

## 2015-12-14 DIAGNOSIS — D519 Vitamin B12 deficiency anemia, unspecified: Secondary | ICD-10-CM | POA: Diagnosis not present

## 2015-12-14 DIAGNOSIS — D631 Anemia in chronic kidney disease: Secondary | ICD-10-CM | POA: Diagnosis not present

## 2015-12-14 DIAGNOSIS — D529 Folate deficiency anemia, unspecified: Secondary | ICD-10-CM | POA: Diagnosis not present

## 2015-12-14 DIAGNOSIS — D500 Iron deficiency anemia secondary to blood loss (chronic): Secondary | ICD-10-CM | POA: Diagnosis not present

## 2015-12-27 DIAGNOSIS — D649 Anemia, unspecified: Secondary | ICD-10-CM | POA: Diagnosis not present

## 2015-12-27 DIAGNOSIS — R195 Other fecal abnormalities: Secondary | ICD-10-CM | POA: Diagnosis not present

## 2015-12-28 DIAGNOSIS — R195 Other fecal abnormalities: Secondary | ICD-10-CM | POA: Diagnosis not present

## 2015-12-28 DIAGNOSIS — D649 Anemia, unspecified: Secondary | ICD-10-CM | POA: Diagnosis not present

## 2016-01-04 DIAGNOSIS — H35731 Hemorrhagic detachment of retinal pigment epithelium, right eye: Secondary | ICD-10-CM | POA: Diagnosis not present

## 2016-01-04 DIAGNOSIS — H353211 Exudative age-related macular degeneration, right eye, with active choroidal neovascularization: Secondary | ICD-10-CM | POA: Diagnosis not present

## 2016-01-17 DIAGNOSIS — Z5181 Encounter for therapeutic drug level monitoring: Secondary | ICD-10-CM | POA: Diagnosis not present

## 2016-01-17 DIAGNOSIS — M533 Sacrococcygeal disorders, not elsewhere classified: Secondary | ICD-10-CM | POA: Diagnosis not present

## 2016-01-17 DIAGNOSIS — G894 Chronic pain syndrome: Secondary | ICD-10-CM | POA: Diagnosis not present

## 2016-01-17 DIAGNOSIS — Z79899 Other long term (current) drug therapy: Secondary | ICD-10-CM | POA: Diagnosis not present

## 2016-01-17 DIAGNOSIS — M47816 Spondylosis without myelopathy or radiculopathy, lumbar region: Secondary | ICD-10-CM | POA: Diagnosis not present

## 2016-01-19 DIAGNOSIS — H353221 Exudative age-related macular degeneration, left eye, with active choroidal neovascularization: Secondary | ICD-10-CM | POA: Diagnosis not present

## 2016-01-21 DIAGNOSIS — E119 Type 2 diabetes mellitus without complications: Secondary | ICD-10-CM | POA: Diagnosis not present

## 2016-01-21 DIAGNOSIS — D649 Anemia, unspecified: Secondary | ICD-10-CM | POA: Diagnosis not present

## 2016-01-21 DIAGNOSIS — Z882 Allergy status to sulfonamides status: Secondary | ICD-10-CM | POA: Diagnosis not present

## 2016-01-21 DIAGNOSIS — R195 Other fecal abnormalities: Secondary | ICD-10-CM | POA: Diagnosis not present

## 2016-01-21 DIAGNOSIS — Z7982 Long term (current) use of aspirin: Secondary | ICD-10-CM | POA: Diagnosis not present

## 2016-01-21 DIAGNOSIS — K219 Gastro-esophageal reflux disease without esophagitis: Secondary | ICD-10-CM | POA: Diagnosis not present

## 2016-01-21 DIAGNOSIS — Z886 Allergy status to analgesic agent status: Secondary | ICD-10-CM | POA: Diagnosis not present

## 2016-01-21 DIAGNOSIS — K259 Gastric ulcer, unspecified as acute or chronic, without hemorrhage or perforation: Secondary | ICD-10-CM | POA: Diagnosis not present

## 2016-01-21 DIAGNOSIS — Z880 Allergy status to penicillin: Secondary | ICD-10-CM | POA: Diagnosis not present

## 2016-01-21 DIAGNOSIS — Z87891 Personal history of nicotine dependence: Secondary | ICD-10-CM | POA: Diagnosis not present

## 2016-01-21 DIAGNOSIS — I10 Essential (primary) hypertension: Secondary | ICD-10-CM | POA: Diagnosis not present

## 2016-01-21 DIAGNOSIS — Z79899 Other long term (current) drug therapy: Secondary | ICD-10-CM | POA: Diagnosis not present

## 2016-01-21 DIAGNOSIS — E039 Hypothyroidism, unspecified: Secondary | ICD-10-CM | POA: Diagnosis not present

## 2016-01-21 DIAGNOSIS — Z8673 Personal history of transient ischemic attack (TIA), and cerebral infarction without residual deficits: Secondary | ICD-10-CM | POA: Diagnosis not present

## 2016-01-24 DIAGNOSIS — M533 Sacrococcygeal disorders, not elsewhere classified: Secondary | ICD-10-CM | POA: Diagnosis not present

## 2016-01-27 DIAGNOSIS — Z006 Encounter for examination for normal comparison and control in clinical research program: Secondary | ICD-10-CM | POA: Diagnosis not present

## 2016-01-27 DIAGNOSIS — J9811 Atelectasis: Secondary | ICD-10-CM | POA: Diagnosis not present

## 2016-01-27 DIAGNOSIS — Z09 Encounter for follow-up examination after completed treatment for conditions other than malignant neoplasm: Secondary | ICD-10-CM | POA: Diagnosis not present

## 2016-01-27 DIAGNOSIS — Z87891 Personal history of nicotine dependence: Secondary | ICD-10-CM | POA: Diagnosis not present

## 2016-01-27 DIAGNOSIS — I10 Essential (primary) hypertension: Secondary | ICD-10-CM | POA: Diagnosis not present

## 2016-01-27 DIAGNOSIS — I708 Atherosclerosis of other arteries: Secondary | ICD-10-CM | POA: Diagnosis not present

## 2016-01-27 DIAGNOSIS — Z95828 Presence of other vascular implants and grafts: Secondary | ICD-10-CM | POA: Diagnosis not present

## 2016-01-27 DIAGNOSIS — I719 Aortic aneurysm of unspecified site, without rupture: Secondary | ICD-10-CM | POA: Diagnosis not present

## 2016-01-27 DIAGNOSIS — K5730 Diverticulosis of large intestine without perforation or abscess without bleeding: Secondary | ICD-10-CM | POA: Diagnosis not present

## 2016-01-27 DIAGNOSIS — I712 Thoracic aortic aneurysm, without rupture: Secondary | ICD-10-CM | POA: Diagnosis not present

## 2016-01-27 DIAGNOSIS — I716 Thoracoabdominal aortic aneurysm, without rupture: Secondary | ICD-10-CM | POA: Diagnosis not present

## 2016-02-01 DIAGNOSIS — H353211 Exudative age-related macular degeneration, right eye, with active choroidal neovascularization: Secondary | ICD-10-CM | POA: Diagnosis not present

## 2016-02-01 DIAGNOSIS — H35731 Hemorrhagic detachment of retinal pigment epithelium, right eye: Secondary | ICD-10-CM | POA: Diagnosis not present

## 2016-02-02 DIAGNOSIS — E039 Hypothyroidism, unspecified: Secondary | ICD-10-CM | POA: Diagnosis not present

## 2016-02-02 DIAGNOSIS — D500 Iron deficiency anemia secondary to blood loss (chronic): Secondary | ICD-10-CM | POA: Diagnosis not present

## 2016-02-02 DIAGNOSIS — I10 Essential (primary) hypertension: Secondary | ICD-10-CM | POA: Diagnosis not present

## 2016-02-02 DIAGNOSIS — N183 Chronic kidney disease, stage 3 (moderate): Secondary | ICD-10-CM | POA: Diagnosis not present

## 2016-02-02 DIAGNOSIS — D519 Vitamin B12 deficiency anemia, unspecified: Secondary | ICD-10-CM | POA: Diagnosis not present

## 2016-02-02 DIAGNOSIS — D529 Folate deficiency anemia, unspecified: Secondary | ICD-10-CM | POA: Diagnosis not present

## 2016-02-02 DIAGNOSIS — E1122 Type 2 diabetes mellitus with diabetic chronic kidney disease: Secondary | ICD-10-CM | POA: Diagnosis not present

## 2016-02-02 DIAGNOSIS — E782 Mixed hyperlipidemia: Secondary | ICD-10-CM | POA: Diagnosis not present

## 2016-02-02 DIAGNOSIS — K219 Gastro-esophageal reflux disease without esophagitis: Secondary | ICD-10-CM | POA: Diagnosis not present

## 2016-02-04 DIAGNOSIS — I714 Abdominal aortic aneurysm, without rupture: Secondary | ICD-10-CM | POA: Diagnosis not present

## 2016-02-04 DIAGNOSIS — I10 Essential (primary) hypertension: Secondary | ICD-10-CM | POA: Diagnosis not present

## 2016-02-04 DIAGNOSIS — E782 Mixed hyperlipidemia: Secondary | ICD-10-CM | POA: Diagnosis not present

## 2016-02-04 DIAGNOSIS — E039 Hypothyroidism, unspecified: Secondary | ICD-10-CM | POA: Diagnosis not present

## 2016-02-04 DIAGNOSIS — K219 Gastro-esophageal reflux disease without esophagitis: Secondary | ICD-10-CM | POA: Diagnosis not present

## 2016-02-04 DIAGNOSIS — F331 Major depressive disorder, recurrent, moderate: Secondary | ICD-10-CM | POA: Diagnosis not present

## 2016-02-04 DIAGNOSIS — E1122 Type 2 diabetes mellitus with diabetic chronic kidney disease: Secondary | ICD-10-CM | POA: Diagnosis not present

## 2016-02-04 DIAGNOSIS — D500 Iron deficiency anemia secondary to blood loss (chronic): Secondary | ICD-10-CM | POA: Diagnosis not present

## 2016-02-15 DIAGNOSIS — J189 Pneumonia, unspecified organism: Secondary | ICD-10-CM | POA: Diagnosis not present

## 2016-02-15 DIAGNOSIS — R05 Cough: Secondary | ICD-10-CM | POA: Diagnosis not present

## 2016-02-15 DIAGNOSIS — Z6830 Body mass index (BMI) 30.0-30.9, adult: Secondary | ICD-10-CM | POA: Diagnosis not present

## 2016-02-15 DIAGNOSIS — R300 Dysuria: Secondary | ICD-10-CM | POA: Diagnosis not present

## 2016-02-15 DIAGNOSIS — M545 Low back pain: Secondary | ICD-10-CM | POA: Diagnosis not present

## 2016-02-18 DIAGNOSIS — M47816 Spondylosis without myelopathy or radiculopathy, lumbar region: Secondary | ICD-10-CM | POA: Diagnosis not present

## 2016-02-18 DIAGNOSIS — G894 Chronic pain syndrome: Secondary | ICD-10-CM | POA: Diagnosis not present

## 2016-02-18 DIAGNOSIS — G8929 Other chronic pain: Secondary | ICD-10-CM | POA: Diagnosis not present

## 2016-02-18 DIAGNOSIS — M533 Sacrococcygeal disorders, not elsewhere classified: Secondary | ICD-10-CM | POA: Diagnosis not present

## 2016-02-22 DIAGNOSIS — H353221 Exudative age-related macular degeneration, left eye, with active choroidal neovascularization: Secondary | ICD-10-CM | POA: Diagnosis not present

## 2016-03-06 DIAGNOSIS — H353211 Exudative age-related macular degeneration, right eye, with active choroidal neovascularization: Secondary | ICD-10-CM | POA: Diagnosis not present

## 2016-03-06 DIAGNOSIS — H35731 Hemorrhagic detachment of retinal pigment epithelium, right eye: Secondary | ICD-10-CM | POA: Diagnosis not present

## 2016-04-04 DIAGNOSIS — H353221 Exudative age-related macular degeneration, left eye, with active choroidal neovascularization: Secondary | ICD-10-CM | POA: Diagnosis not present

## 2016-04-06 DIAGNOSIS — G894 Chronic pain syndrome: Secondary | ICD-10-CM | POA: Diagnosis not present

## 2016-04-06 DIAGNOSIS — M47816 Spondylosis without myelopathy or radiculopathy, lumbar region: Secondary | ICD-10-CM | POA: Diagnosis not present

## 2016-04-06 DIAGNOSIS — G8929 Other chronic pain: Secondary | ICD-10-CM | POA: Diagnosis not present

## 2016-04-06 DIAGNOSIS — M533 Sacrococcygeal disorders, not elsewhere classified: Secondary | ICD-10-CM | POA: Diagnosis not present

## 2016-04-11 DIAGNOSIS — H353211 Exudative age-related macular degeneration, right eye, with active choroidal neovascularization: Secondary | ICD-10-CM | POA: Diagnosis not present

## 2016-05-01 DIAGNOSIS — D500 Iron deficiency anemia secondary to blood loss (chronic): Secondary | ICD-10-CM | POA: Diagnosis not present

## 2016-05-01 DIAGNOSIS — E039 Hypothyroidism, unspecified: Secondary | ICD-10-CM | POA: Diagnosis not present

## 2016-05-01 DIAGNOSIS — E875 Hyperkalemia: Secondary | ICD-10-CM | POA: Diagnosis not present

## 2016-05-01 DIAGNOSIS — I10 Essential (primary) hypertension: Secondary | ICD-10-CM | POA: Diagnosis not present

## 2016-05-01 DIAGNOSIS — E1122 Type 2 diabetes mellitus with diabetic chronic kidney disease: Secondary | ICD-10-CM | POA: Diagnosis not present

## 2016-05-01 DIAGNOSIS — E782 Mixed hyperlipidemia: Secondary | ICD-10-CM | POA: Diagnosis not present

## 2016-05-04 DIAGNOSIS — E782 Mixed hyperlipidemia: Secondary | ICD-10-CM | POA: Diagnosis not present

## 2016-05-04 DIAGNOSIS — I10 Essential (primary) hypertension: Secondary | ICD-10-CM | POA: Diagnosis not present

## 2016-05-04 DIAGNOSIS — F331 Major depressive disorder, recurrent, moderate: Secondary | ICD-10-CM | POA: Diagnosis not present

## 2016-05-04 DIAGNOSIS — E039 Hypothyroidism, unspecified: Secondary | ICD-10-CM | POA: Diagnosis not present

## 2016-05-04 DIAGNOSIS — Z23 Encounter for immunization: Secondary | ICD-10-CM | POA: Diagnosis not present

## 2016-05-04 DIAGNOSIS — D500 Iron deficiency anemia secondary to blood loss (chronic): Secondary | ICD-10-CM | POA: Diagnosis not present

## 2016-05-04 DIAGNOSIS — E1122 Type 2 diabetes mellitus with diabetic chronic kidney disease: Secondary | ICD-10-CM | POA: Diagnosis not present

## 2016-05-04 DIAGNOSIS — I714 Abdominal aortic aneurysm, without rupture: Secondary | ICD-10-CM | POA: Diagnosis not present

## 2016-05-09 DIAGNOSIS — H353221 Exudative age-related macular degeneration, left eye, with active choroidal neovascularization: Secondary | ICD-10-CM | POA: Diagnosis not present

## 2016-05-12 DIAGNOSIS — R2689 Other abnormalities of gait and mobility: Secondary | ICD-10-CM | POA: Diagnosis not present

## 2016-05-12 DIAGNOSIS — R42 Dizziness and giddiness: Secondary | ICD-10-CM | POA: Diagnosis not present

## 2016-05-16 DIAGNOSIS — R2689 Other abnormalities of gait and mobility: Secondary | ICD-10-CM | POA: Diagnosis not present

## 2016-05-16 DIAGNOSIS — R42 Dizziness and giddiness: Secondary | ICD-10-CM | POA: Diagnosis not present

## 2016-05-17 DIAGNOSIS — R42 Dizziness and giddiness: Secondary | ICD-10-CM | POA: Diagnosis not present

## 2016-05-17 DIAGNOSIS — R2689 Other abnormalities of gait and mobility: Secondary | ICD-10-CM | POA: Diagnosis not present

## 2016-05-19 DIAGNOSIS — R2689 Other abnormalities of gait and mobility: Secondary | ICD-10-CM | POA: Diagnosis not present

## 2016-05-19 DIAGNOSIS — R42 Dizziness and giddiness: Secondary | ICD-10-CM | POA: Diagnosis not present

## 2016-05-22 DIAGNOSIS — M4698 Unspecified inflammatory spondylopathy, sacral and sacrococcygeal region: Secondary | ICD-10-CM | POA: Diagnosis not present

## 2016-05-22 DIAGNOSIS — M533 Sacrococcygeal disorders, not elsewhere classified: Secondary | ICD-10-CM | POA: Diagnosis not present

## 2016-05-23 DIAGNOSIS — H353211 Exudative age-related macular degeneration, right eye, with active choroidal neovascularization: Secondary | ICD-10-CM | POA: Diagnosis not present

## 2016-05-24 DIAGNOSIS — R2689 Other abnormalities of gait and mobility: Secondary | ICD-10-CM | POA: Diagnosis not present

## 2016-05-24 DIAGNOSIS — R42 Dizziness and giddiness: Secondary | ICD-10-CM | POA: Diagnosis not present

## 2016-05-26 DIAGNOSIS — R2689 Other abnormalities of gait and mobility: Secondary | ICD-10-CM | POA: Diagnosis not present

## 2016-05-26 DIAGNOSIS — R42 Dizziness and giddiness: Secondary | ICD-10-CM | POA: Diagnosis not present

## 2016-05-29 DIAGNOSIS — R42 Dizziness and giddiness: Secondary | ICD-10-CM | POA: Diagnosis not present

## 2016-05-29 DIAGNOSIS — R2689 Other abnormalities of gait and mobility: Secondary | ICD-10-CM | POA: Diagnosis not present

## 2016-05-31 DIAGNOSIS — R2689 Other abnormalities of gait and mobility: Secondary | ICD-10-CM | POA: Diagnosis not present

## 2016-05-31 DIAGNOSIS — R42 Dizziness and giddiness: Secondary | ICD-10-CM | POA: Diagnosis not present

## 2016-06-02 DIAGNOSIS — R2689 Other abnormalities of gait and mobility: Secondary | ICD-10-CM | POA: Diagnosis not present

## 2016-06-02 DIAGNOSIS — R42 Dizziness and giddiness: Secondary | ICD-10-CM | POA: Diagnosis not present

## 2016-06-07 DIAGNOSIS — R2689 Other abnormalities of gait and mobility: Secondary | ICD-10-CM | POA: Diagnosis not present

## 2016-06-07 DIAGNOSIS — R42 Dizziness and giddiness: Secondary | ICD-10-CM | POA: Diagnosis not present

## 2016-06-08 DIAGNOSIS — R2689 Other abnormalities of gait and mobility: Secondary | ICD-10-CM | POA: Diagnosis not present

## 2016-06-08 DIAGNOSIS — R42 Dizziness and giddiness: Secondary | ICD-10-CM | POA: Diagnosis not present

## 2016-06-09 DIAGNOSIS — I10 Essential (primary) hypertension: Secondary | ICD-10-CM | POA: Diagnosis not present

## 2016-06-09 DIAGNOSIS — M533 Sacrococcygeal disorders, not elsewhere classified: Secondary | ICD-10-CM | POA: Diagnosis not present

## 2016-06-09 DIAGNOSIS — G894 Chronic pain syndrome: Secondary | ICD-10-CM | POA: Diagnosis not present

## 2016-06-09 DIAGNOSIS — Z885 Allergy status to narcotic agent status: Secondary | ICD-10-CM | POA: Diagnosis not present

## 2016-06-09 DIAGNOSIS — E039 Hypothyroidism, unspecified: Secondary | ICD-10-CM | POA: Diagnosis not present

## 2016-06-09 DIAGNOSIS — Z7982 Long term (current) use of aspirin: Secondary | ICD-10-CM | POA: Diagnosis not present

## 2016-06-09 DIAGNOSIS — Z8673 Personal history of transient ischemic attack (TIA), and cerebral infarction without residual deficits: Secondary | ICD-10-CM | POA: Diagnosis not present

## 2016-06-09 DIAGNOSIS — E119 Type 2 diabetes mellitus without complications: Secondary | ICD-10-CM | POA: Diagnosis not present

## 2016-06-09 DIAGNOSIS — K219 Gastro-esophageal reflux disease without esophagitis: Secondary | ICD-10-CM | POA: Diagnosis not present

## 2016-06-09 DIAGNOSIS — Z7984 Long term (current) use of oral hypoglycemic drugs: Secondary | ICD-10-CM | POA: Diagnosis not present

## 2016-06-09 DIAGNOSIS — Z880 Allergy status to penicillin: Secondary | ICD-10-CM | POA: Diagnosis not present

## 2016-06-09 DIAGNOSIS — M47816 Spondylosis without myelopathy or radiculopathy, lumbar region: Secondary | ICD-10-CM | POA: Diagnosis not present

## 2016-06-09 DIAGNOSIS — M791 Myalgia: Secondary | ICD-10-CM | POA: Diagnosis not present

## 2016-06-09 DIAGNOSIS — Z87891 Personal history of nicotine dependence: Secondary | ICD-10-CM | POA: Diagnosis not present

## 2016-06-09 DIAGNOSIS — Z79899 Other long term (current) drug therapy: Secondary | ICD-10-CM | POA: Diagnosis not present

## 2016-06-09 DIAGNOSIS — Z882 Allergy status to sulfonamides status: Secondary | ICD-10-CM | POA: Diagnosis not present

## 2016-06-13 DIAGNOSIS — H353221 Exudative age-related macular degeneration, left eye, with active choroidal neovascularization: Secondary | ICD-10-CM | POA: Diagnosis not present

## 2016-06-27 DIAGNOSIS — H353211 Exudative age-related macular degeneration, right eye, with active choroidal neovascularization: Secondary | ICD-10-CM | POA: Diagnosis not present

## 2016-06-28 DIAGNOSIS — E1122 Type 2 diabetes mellitus with diabetic chronic kidney disease: Secondary | ICD-10-CM | POA: Diagnosis not present

## 2016-06-28 DIAGNOSIS — E039 Hypothyroidism, unspecified: Secondary | ICD-10-CM | POA: Diagnosis not present

## 2016-06-28 DIAGNOSIS — I10 Essential (primary) hypertension: Secondary | ICD-10-CM | POA: Diagnosis not present

## 2016-06-28 DIAGNOSIS — Z23 Encounter for immunization: Secondary | ICD-10-CM | POA: Diagnosis not present

## 2016-06-28 DIAGNOSIS — E782 Mixed hyperlipidemia: Secondary | ICD-10-CM | POA: Diagnosis not present

## 2016-07-05 DIAGNOSIS — R2689 Other abnormalities of gait and mobility: Secondary | ICD-10-CM | POA: Diagnosis not present

## 2016-07-05 DIAGNOSIS — R42 Dizziness and giddiness: Secondary | ICD-10-CM | POA: Diagnosis not present

## 2016-07-06 DIAGNOSIS — M5137 Other intervertebral disc degeneration, lumbosacral region: Secondary | ICD-10-CM | POA: Diagnosis not present

## 2016-07-06 DIAGNOSIS — M4316 Spondylolisthesis, lumbar region: Secondary | ICD-10-CM | POA: Diagnosis not present

## 2016-07-06 DIAGNOSIS — M5136 Other intervertebral disc degeneration, lumbar region: Secondary | ICD-10-CM | POA: Diagnosis not present

## 2016-07-06 DIAGNOSIS — M47816 Spondylosis without myelopathy or radiculopathy, lumbar region: Secondary | ICD-10-CM | POA: Diagnosis not present

## 2016-07-06 DIAGNOSIS — M8588 Other specified disorders of bone density and structure, other site: Secondary | ICD-10-CM | POA: Diagnosis not present

## 2016-07-06 DIAGNOSIS — G894 Chronic pain syndrome: Secondary | ICD-10-CM | POA: Diagnosis not present

## 2016-07-11 DIAGNOSIS — R2689 Other abnormalities of gait and mobility: Secondary | ICD-10-CM | POA: Diagnosis not present

## 2016-07-11 DIAGNOSIS — R42 Dizziness and giddiness: Secondary | ICD-10-CM | POA: Diagnosis not present

## 2016-07-12 DIAGNOSIS — M48061 Spinal stenosis, lumbar region without neurogenic claudication: Secondary | ICD-10-CM | POA: Diagnosis not present

## 2016-07-12 DIAGNOSIS — M47817 Spondylosis without myelopathy or radiculopathy, lumbosacral region: Secondary | ICD-10-CM | POA: Diagnosis not present

## 2016-07-12 DIAGNOSIS — M47816 Spondylosis without myelopathy or radiculopathy, lumbar region: Secondary | ICD-10-CM | POA: Diagnosis not present

## 2016-07-12 DIAGNOSIS — M4316 Spondylolisthesis, lumbar region: Secondary | ICD-10-CM | POA: Diagnosis not present

## 2016-07-12 DIAGNOSIS — M4807 Spinal stenosis, lumbosacral region: Secondary | ICD-10-CM | POA: Diagnosis not present

## 2016-07-12 DIAGNOSIS — M47896 Other spondylosis, lumbar region: Secondary | ICD-10-CM | POA: Diagnosis not present

## 2016-07-12 DIAGNOSIS — M2578 Osteophyte, vertebrae: Secondary | ICD-10-CM | POA: Diagnosis not present

## 2016-07-13 DIAGNOSIS — R42 Dizziness and giddiness: Secondary | ICD-10-CM | POA: Diagnosis not present

## 2016-07-13 DIAGNOSIS — R2689 Other abnormalities of gait and mobility: Secondary | ICD-10-CM | POA: Diagnosis not present

## 2016-07-14 DIAGNOSIS — R42 Dizziness and giddiness: Secondary | ICD-10-CM | POA: Diagnosis not present

## 2016-07-14 DIAGNOSIS — R2689 Other abnormalities of gait and mobility: Secondary | ICD-10-CM | POA: Diagnosis not present

## 2016-07-17 DIAGNOSIS — R42 Dizziness and giddiness: Secondary | ICD-10-CM | POA: Diagnosis not present

## 2016-07-17 DIAGNOSIS — R2689 Other abnormalities of gait and mobility: Secondary | ICD-10-CM | POA: Diagnosis not present

## 2016-07-19 DIAGNOSIS — R42 Dizziness and giddiness: Secondary | ICD-10-CM | POA: Diagnosis not present

## 2016-07-19 DIAGNOSIS — R2689 Other abnormalities of gait and mobility: Secondary | ICD-10-CM | POA: Diagnosis not present

## 2016-07-21 DIAGNOSIS — R2689 Other abnormalities of gait and mobility: Secondary | ICD-10-CM | POA: Diagnosis not present

## 2016-07-21 DIAGNOSIS — R42 Dizziness and giddiness: Secondary | ICD-10-CM | POA: Diagnosis not present

## 2016-07-24 DIAGNOSIS — H353221 Exudative age-related macular degeneration, left eye, with active choroidal neovascularization: Secondary | ICD-10-CM | POA: Diagnosis not present

## 2016-07-27 DIAGNOSIS — M47817 Spondylosis without myelopathy or radiculopathy, lumbosacral region: Secondary | ICD-10-CM | POA: Diagnosis not present

## 2016-07-28 DIAGNOSIS — R2689 Other abnormalities of gait and mobility: Secondary | ICD-10-CM | POA: Diagnosis not present

## 2016-07-28 DIAGNOSIS — R42 Dizziness and giddiness: Secondary | ICD-10-CM | POA: Diagnosis not present

## 2016-07-31 DIAGNOSIS — R42 Dizziness and giddiness: Secondary | ICD-10-CM | POA: Diagnosis not present

## 2016-07-31 DIAGNOSIS — R2689 Other abnormalities of gait and mobility: Secondary | ICD-10-CM | POA: Diagnosis not present

## 2016-08-01 DIAGNOSIS — E039 Hypothyroidism, unspecified: Secondary | ICD-10-CM | POA: Diagnosis not present

## 2016-08-02 DIAGNOSIS — R42 Dizziness and giddiness: Secondary | ICD-10-CM | POA: Diagnosis not present

## 2016-08-02 DIAGNOSIS — R2689 Other abnormalities of gait and mobility: Secondary | ICD-10-CM | POA: Diagnosis not present

## 2016-08-03 DIAGNOSIS — R42 Dizziness and giddiness: Secondary | ICD-10-CM | POA: Diagnosis not present

## 2016-08-03 DIAGNOSIS — R2689 Other abnormalities of gait and mobility: Secondary | ICD-10-CM | POA: Diagnosis not present

## 2016-08-07 DIAGNOSIS — R42 Dizziness and giddiness: Secondary | ICD-10-CM | POA: Diagnosis not present

## 2016-08-07 DIAGNOSIS — R2689 Other abnormalities of gait and mobility: Secondary | ICD-10-CM | POA: Diagnosis not present

## 2016-08-08 DIAGNOSIS — H353211 Exudative age-related macular degeneration, right eye, with active choroidal neovascularization: Secondary | ICD-10-CM | POA: Diagnosis not present

## 2016-08-09 DIAGNOSIS — R42 Dizziness and giddiness: Secondary | ICD-10-CM | POA: Diagnosis not present

## 2016-08-09 DIAGNOSIS — R2689 Other abnormalities of gait and mobility: Secondary | ICD-10-CM | POA: Diagnosis not present

## 2016-08-10 DIAGNOSIS — Z6831 Body mass index (BMI) 31.0-31.9, adult: Secondary | ICD-10-CM | POA: Diagnosis not present

## 2016-08-10 DIAGNOSIS — E782 Mixed hyperlipidemia: Secondary | ICD-10-CM | POA: Diagnosis not present

## 2016-08-10 DIAGNOSIS — I10 Essential (primary) hypertension: Secondary | ICD-10-CM | POA: Diagnosis not present

## 2016-08-10 DIAGNOSIS — E039 Hypothyroidism, unspecified: Secondary | ICD-10-CM | POA: Diagnosis not present

## 2016-08-10 DIAGNOSIS — I714 Abdominal aortic aneurysm, without rupture: Secondary | ICD-10-CM | POA: Diagnosis not present

## 2016-08-10 DIAGNOSIS — D500 Iron deficiency anemia secondary to blood loss (chronic): Secondary | ICD-10-CM | POA: Diagnosis not present

## 2016-08-10 DIAGNOSIS — F331 Major depressive disorder, recurrent, moderate: Secondary | ICD-10-CM | POA: Diagnosis not present

## 2016-08-10 DIAGNOSIS — E1122 Type 2 diabetes mellitus with diabetic chronic kidney disease: Secondary | ICD-10-CM | POA: Diagnosis not present

## 2016-08-11 DIAGNOSIS — R2689 Other abnormalities of gait and mobility: Secondary | ICD-10-CM | POA: Diagnosis not present

## 2016-08-11 DIAGNOSIS — R42 Dizziness and giddiness: Secondary | ICD-10-CM | POA: Diagnosis not present

## 2016-08-22 DIAGNOSIS — L570 Actinic keratosis: Secondary | ICD-10-CM | POA: Diagnosis not present

## 2016-08-22 DIAGNOSIS — Z85828 Personal history of other malignant neoplasm of skin: Secondary | ICD-10-CM | POA: Diagnosis not present

## 2016-08-22 DIAGNOSIS — I872 Venous insufficiency (chronic) (peripheral): Secondary | ICD-10-CM | POA: Diagnosis not present

## 2016-09-12 DIAGNOSIS — H353221 Exudative age-related macular degeneration, left eye, with active choroidal neovascularization: Secondary | ICD-10-CM | POA: Diagnosis not present

## 2016-09-14 DIAGNOSIS — M1711 Unilateral primary osteoarthritis, right knee: Secondary | ICD-10-CM | POA: Diagnosis not present

## 2016-09-14 DIAGNOSIS — G894 Chronic pain syndrome: Secondary | ICD-10-CM | POA: Diagnosis not present

## 2016-09-14 DIAGNOSIS — M47817 Spondylosis without myelopathy or radiculopathy, lumbosacral region: Secondary | ICD-10-CM | POA: Diagnosis not present

## 2016-09-14 DIAGNOSIS — G8929 Other chronic pain: Secondary | ICD-10-CM | POA: Diagnosis not present

## 2016-09-14 DIAGNOSIS — M25561 Pain in right knee: Secondary | ICD-10-CM | POA: Diagnosis not present

## 2016-09-15 DIAGNOSIS — D529 Folate deficiency anemia, unspecified: Secondary | ICD-10-CM | POA: Diagnosis not present

## 2016-09-15 DIAGNOSIS — E039 Hypothyroidism, unspecified: Secondary | ICD-10-CM | POA: Diagnosis not present

## 2016-09-15 DIAGNOSIS — Z23 Encounter for immunization: Secondary | ICD-10-CM | POA: Diagnosis not present

## 2016-09-15 DIAGNOSIS — D500 Iron deficiency anemia secondary to blood loss (chronic): Secondary | ICD-10-CM | POA: Diagnosis not present

## 2016-09-15 DIAGNOSIS — I10 Essential (primary) hypertension: Secondary | ICD-10-CM | POA: Diagnosis not present

## 2016-09-15 DIAGNOSIS — D519 Vitamin B12 deficiency anemia, unspecified: Secondary | ICD-10-CM | POA: Diagnosis not present

## 2016-09-15 DIAGNOSIS — I714 Abdominal aortic aneurysm, without rupture: Secondary | ICD-10-CM | POA: Diagnosis not present

## 2016-09-15 DIAGNOSIS — E782 Mixed hyperlipidemia: Secondary | ICD-10-CM | POA: Diagnosis not present

## 2016-09-15 DIAGNOSIS — Z1212 Encounter for screening for malignant neoplasm of rectum: Secondary | ICD-10-CM | POA: Diagnosis not present

## 2016-09-15 DIAGNOSIS — F331 Major depressive disorder, recurrent, moderate: Secondary | ICD-10-CM | POA: Diagnosis not present

## 2016-09-15 DIAGNOSIS — E1122 Type 2 diabetes mellitus with diabetic chronic kidney disease: Secondary | ICD-10-CM | POA: Diagnosis not present

## 2016-09-15 DIAGNOSIS — N184 Chronic kidney disease, stage 4 (severe): Secondary | ICD-10-CM | POA: Diagnosis not present

## 2016-09-15 DIAGNOSIS — Z6831 Body mass index (BMI) 31.0-31.9, adult: Secondary | ICD-10-CM | POA: Diagnosis not present

## 2016-09-15 DIAGNOSIS — K219 Gastro-esophageal reflux disease without esophagitis: Secondary | ICD-10-CM | POA: Diagnosis not present

## 2016-09-15 DIAGNOSIS — D509 Iron deficiency anemia, unspecified: Secondary | ICD-10-CM | POA: Diagnosis not present

## 2016-09-16 DIAGNOSIS — R1013 Epigastric pain: Secondary | ICD-10-CM | POA: Diagnosis not present

## 2016-09-19 DIAGNOSIS — H353211 Exudative age-related macular degeneration, right eye, with active choroidal neovascularization: Secondary | ICD-10-CM | POA: Diagnosis not present

## 2016-09-22 DIAGNOSIS — M47817 Spondylosis without myelopathy or radiculopathy, lumbosacral region: Secondary | ICD-10-CM | POA: Diagnosis not present

## 2016-09-22 DIAGNOSIS — K219 Gastro-esophageal reflux disease without esophagitis: Secondary | ICD-10-CM | POA: Diagnosis not present

## 2016-09-22 DIAGNOSIS — Z7982 Long term (current) use of aspirin: Secondary | ICD-10-CM | POA: Diagnosis not present

## 2016-09-22 DIAGNOSIS — E119 Type 2 diabetes mellitus without complications: Secondary | ICD-10-CM | POA: Diagnosis not present

## 2016-09-22 DIAGNOSIS — I10 Essential (primary) hypertension: Secondary | ICD-10-CM | POA: Diagnosis not present

## 2016-09-22 DIAGNOSIS — Z87891 Personal history of nicotine dependence: Secondary | ICD-10-CM | POA: Diagnosis not present

## 2016-09-22 DIAGNOSIS — Z8673 Personal history of transient ischemic attack (TIA), and cerebral infarction without residual deficits: Secondary | ICD-10-CM | POA: Diagnosis not present

## 2016-09-22 DIAGNOSIS — Z7984 Long term (current) use of oral hypoglycemic drugs: Secondary | ICD-10-CM | POA: Diagnosis not present

## 2016-09-22 DIAGNOSIS — M47816 Spondylosis without myelopathy or radiculopathy, lumbar region: Secondary | ICD-10-CM | POA: Diagnosis not present

## 2016-09-22 DIAGNOSIS — E039 Hypothyroidism, unspecified: Secondary | ICD-10-CM | POA: Diagnosis not present

## 2016-09-22 DIAGNOSIS — Z79899 Other long term (current) drug therapy: Secondary | ICD-10-CM | POA: Diagnosis not present

## 2016-10-03 DIAGNOSIS — R300 Dysuria: Secondary | ICD-10-CM | POA: Diagnosis not present

## 2016-10-03 DIAGNOSIS — N390 Urinary tract infection, site not specified: Secondary | ICD-10-CM | POA: Diagnosis not present

## 2016-10-03 DIAGNOSIS — R509 Fever, unspecified: Secondary | ICD-10-CM | POA: Diagnosis not present

## 2016-10-06 DIAGNOSIS — E871 Hypo-osmolality and hyponatremia: Secondary | ICD-10-CM | POA: Diagnosis not present

## 2016-10-06 DIAGNOSIS — M5116 Intervertebral disc disorders with radiculopathy, lumbar region: Secondary | ICD-10-CM | POA: Diagnosis not present

## 2016-10-06 DIAGNOSIS — K219 Gastro-esophageal reflux disease without esophagitis: Secondary | ICD-10-CM | POA: Diagnosis not present

## 2016-10-06 DIAGNOSIS — D509 Iron deficiency anemia, unspecified: Secondary | ICD-10-CM | POA: Diagnosis not present

## 2016-10-06 DIAGNOSIS — R200 Anesthesia of skin: Secondary | ICD-10-CM | POA: Diagnosis not present

## 2016-10-06 DIAGNOSIS — I739 Peripheral vascular disease, unspecified: Secondary | ICD-10-CM | POA: Diagnosis not present

## 2016-10-06 DIAGNOSIS — D649 Anemia, unspecified: Secondary | ICD-10-CM | POA: Diagnosis not present

## 2016-10-06 DIAGNOSIS — Z7982 Long term (current) use of aspirin: Secondary | ICD-10-CM | POA: Diagnosis not present

## 2016-10-06 DIAGNOSIS — J9611 Chronic respiratory failure with hypoxia: Secondary | ICD-10-CM | POA: Diagnosis not present

## 2016-10-06 DIAGNOSIS — Z886 Allergy status to analgesic agent status: Secondary | ICD-10-CM | POA: Diagnosis not present

## 2016-10-06 DIAGNOSIS — K5909 Other constipation: Secondary | ICD-10-CM | POA: Diagnosis not present

## 2016-10-06 DIAGNOSIS — Z7984 Long term (current) use of oral hypoglycemic drugs: Secondary | ICD-10-CM | POA: Diagnosis not present

## 2016-10-06 DIAGNOSIS — E785 Hyperlipidemia, unspecified: Secondary | ICD-10-CM | POA: Diagnosis not present

## 2016-10-06 DIAGNOSIS — H353190 Nonexudative age-related macular degeneration, unspecified eye, stage unspecified: Secondary | ICD-10-CM | POA: Diagnosis not present

## 2016-10-06 DIAGNOSIS — F411 Generalized anxiety disorder: Secondary | ICD-10-CM | POA: Diagnosis not present

## 2016-10-06 DIAGNOSIS — M545 Low back pain: Secondary | ICD-10-CM | POA: Diagnosis not present

## 2016-10-06 DIAGNOSIS — R791 Abnormal coagulation profile: Secondary | ICD-10-CM | POA: Diagnosis not present

## 2016-10-06 DIAGNOSIS — Z90710 Acquired absence of both cervix and uterus: Secondary | ICD-10-CM | POA: Diagnosis not present

## 2016-10-06 DIAGNOSIS — I10 Essential (primary) hypertension: Secondary | ICD-10-CM | POA: Diagnosis not present

## 2016-10-06 DIAGNOSIS — Z8673 Personal history of transient ischemic attack (TIA), and cerebral infarction without residual deficits: Secondary | ICD-10-CM | POA: Diagnosis not present

## 2016-10-06 DIAGNOSIS — J309 Allergic rhinitis, unspecified: Secondary | ICD-10-CM | POA: Diagnosis not present

## 2016-10-06 DIAGNOSIS — E039 Hypothyroidism, unspecified: Secondary | ICD-10-CM | POA: Diagnosis not present

## 2016-10-06 DIAGNOSIS — Z880 Allergy status to penicillin: Secondary | ICD-10-CM | POA: Diagnosis not present

## 2016-10-06 DIAGNOSIS — G629 Polyneuropathy, unspecified: Secondary | ICD-10-CM | POA: Diagnosis not present

## 2016-10-06 DIAGNOSIS — M79605 Pain in left leg: Secondary | ICD-10-CM | POA: Diagnosis not present

## 2016-10-06 DIAGNOSIS — E119 Type 2 diabetes mellitus without complications: Secondary | ICD-10-CM | POA: Diagnosis not present

## 2016-10-06 DIAGNOSIS — Z79899 Other long term (current) drug therapy: Secondary | ICD-10-CM | POA: Diagnosis not present

## 2016-10-07 DIAGNOSIS — D500 Iron deficiency anemia secondary to blood loss (chronic): Secondary | ICD-10-CM | POA: Diagnosis not present

## 2016-10-08 DIAGNOSIS — R0600 Dyspnea, unspecified: Secondary | ICD-10-CM | POA: Diagnosis not present

## 2016-10-08 DIAGNOSIS — R7989 Other specified abnormal findings of blood chemistry: Secondary | ICD-10-CM | POA: Diagnosis not present

## 2016-10-10 DIAGNOSIS — M5416 Radiculopathy, lumbar region: Secondary | ICD-10-CM | POA: Diagnosis not present

## 2016-10-10 DIAGNOSIS — I10 Essential (primary) hypertension: Secondary | ICD-10-CM | POA: Diagnosis not present

## 2016-10-10 DIAGNOSIS — M79605 Pain in left leg: Secondary | ICD-10-CM | POA: Diagnosis not present

## 2016-10-10 DIAGNOSIS — K219 Gastro-esophageal reflux disease without esophagitis: Secondary | ICD-10-CM | POA: Diagnosis not present

## 2016-10-10 DIAGNOSIS — Z7984 Long term (current) use of oral hypoglycemic drugs: Secondary | ICD-10-CM | POA: Diagnosis not present

## 2016-10-10 DIAGNOSIS — D509 Iron deficiency anemia, unspecified: Secondary | ICD-10-CM | POA: Diagnosis not present

## 2016-10-10 DIAGNOSIS — E785 Hyperlipidemia, unspecified: Secondary | ICD-10-CM | POA: Diagnosis not present

## 2016-10-10 DIAGNOSIS — E039 Hypothyroidism, unspecified: Secondary | ICD-10-CM | POA: Diagnosis not present

## 2016-10-10 DIAGNOSIS — E119 Type 2 diabetes mellitus without complications: Secondary | ICD-10-CM | POA: Diagnosis not present

## 2016-10-11 DIAGNOSIS — M79605 Pain in left leg: Secondary | ICD-10-CM | POA: Diagnosis not present

## 2016-10-11 DIAGNOSIS — M5416 Radiculopathy, lumbar region: Secondary | ICD-10-CM | POA: Diagnosis not present

## 2016-10-11 DIAGNOSIS — E119 Type 2 diabetes mellitus without complications: Secondary | ICD-10-CM | POA: Diagnosis not present

## 2016-10-11 DIAGNOSIS — K219 Gastro-esophageal reflux disease without esophagitis: Secondary | ICD-10-CM | POA: Diagnosis not present

## 2016-10-11 DIAGNOSIS — R300 Dysuria: Secondary | ICD-10-CM | POA: Diagnosis not present

## 2016-10-11 DIAGNOSIS — D509 Iron deficiency anemia, unspecified: Secondary | ICD-10-CM | POA: Diagnosis not present

## 2016-10-11 DIAGNOSIS — I10 Essential (primary) hypertension: Secondary | ICD-10-CM | POA: Diagnosis not present

## 2016-10-13 DIAGNOSIS — I10 Essential (primary) hypertension: Secondary | ICD-10-CM | POA: Diagnosis not present

## 2016-10-13 DIAGNOSIS — D509 Iron deficiency anemia, unspecified: Secondary | ICD-10-CM | POA: Diagnosis not present

## 2016-10-13 DIAGNOSIS — M5416 Radiculopathy, lumbar region: Secondary | ICD-10-CM | POA: Diagnosis not present

## 2016-10-13 DIAGNOSIS — E119 Type 2 diabetes mellitus without complications: Secondary | ICD-10-CM | POA: Diagnosis not present

## 2016-10-13 DIAGNOSIS — K219 Gastro-esophageal reflux disease without esophagitis: Secondary | ICD-10-CM | POA: Diagnosis not present

## 2016-10-13 DIAGNOSIS — M79605 Pain in left leg: Secondary | ICD-10-CM | POA: Diagnosis not present

## 2016-10-16 DIAGNOSIS — D509 Iron deficiency anemia, unspecified: Secondary | ICD-10-CM | POA: Diagnosis not present

## 2016-10-16 DIAGNOSIS — E119 Type 2 diabetes mellitus without complications: Secondary | ICD-10-CM | POA: Diagnosis not present

## 2016-10-16 DIAGNOSIS — M79605 Pain in left leg: Secondary | ICD-10-CM | POA: Diagnosis not present

## 2016-10-16 DIAGNOSIS — M5416 Radiculopathy, lumbar region: Secondary | ICD-10-CM | POA: Diagnosis not present

## 2016-10-16 DIAGNOSIS — I10 Essential (primary) hypertension: Secondary | ICD-10-CM | POA: Diagnosis not present

## 2016-10-16 DIAGNOSIS — K219 Gastro-esophageal reflux disease without esophagitis: Secondary | ICD-10-CM | POA: Diagnosis not present

## 2016-10-17 DIAGNOSIS — K219 Gastro-esophageal reflux disease without esophagitis: Secondary | ICD-10-CM | POA: Diagnosis not present

## 2016-10-17 DIAGNOSIS — I10 Essential (primary) hypertension: Secondary | ICD-10-CM | POA: Diagnosis not present

## 2016-10-17 DIAGNOSIS — D509 Iron deficiency anemia, unspecified: Secondary | ICD-10-CM | POA: Diagnosis not present

## 2016-10-17 DIAGNOSIS — E119 Type 2 diabetes mellitus without complications: Secondary | ICD-10-CM | POA: Diagnosis not present

## 2016-10-17 DIAGNOSIS — M79605 Pain in left leg: Secondary | ICD-10-CM | POA: Diagnosis not present

## 2016-10-17 DIAGNOSIS — M5416 Radiculopathy, lumbar region: Secondary | ICD-10-CM | POA: Diagnosis not present

## 2016-10-19 DIAGNOSIS — E119 Type 2 diabetes mellitus without complications: Secondary | ICD-10-CM | POA: Diagnosis not present

## 2016-10-19 DIAGNOSIS — I10 Essential (primary) hypertension: Secondary | ICD-10-CM | POA: Diagnosis not present

## 2016-10-19 DIAGNOSIS — M5416 Radiculopathy, lumbar region: Secondary | ICD-10-CM | POA: Diagnosis not present

## 2016-10-19 DIAGNOSIS — K219 Gastro-esophageal reflux disease without esophagitis: Secondary | ICD-10-CM | POA: Diagnosis not present

## 2016-10-19 DIAGNOSIS — D509 Iron deficiency anemia, unspecified: Secondary | ICD-10-CM | POA: Diagnosis not present

## 2016-10-19 DIAGNOSIS — M79605 Pain in left leg: Secondary | ICD-10-CM | POA: Diagnosis not present

## 2016-10-20 DIAGNOSIS — E039 Hypothyroidism, unspecified: Secondary | ICD-10-CM | POA: Diagnosis not present

## 2016-10-20 DIAGNOSIS — M533 Sacrococcygeal disorders, not elsewhere classified: Secondary | ICD-10-CM | POA: Diagnosis not present

## 2016-10-20 DIAGNOSIS — F4542 Pain disorder with related psychological factors: Secondary | ICD-10-CM | POA: Diagnosis not present

## 2016-10-20 DIAGNOSIS — R42 Dizziness and giddiness: Secondary | ICD-10-CM | POA: Diagnosis not present

## 2016-10-20 DIAGNOSIS — Z87891 Personal history of nicotine dependence: Secondary | ICD-10-CM | POA: Diagnosis not present

## 2016-10-20 DIAGNOSIS — Z881 Allergy status to other antibiotic agents status: Secondary | ICD-10-CM | POA: Diagnosis not present

## 2016-10-20 DIAGNOSIS — Z882 Allergy status to sulfonamides status: Secondary | ICD-10-CM | POA: Diagnosis not present

## 2016-10-20 DIAGNOSIS — Z885 Allergy status to narcotic agent status: Secondary | ICD-10-CM | POA: Diagnosis not present

## 2016-10-20 DIAGNOSIS — M47817 Spondylosis without myelopathy or radiculopathy, lumbosacral region: Secondary | ICD-10-CM | POA: Diagnosis not present

## 2016-10-20 DIAGNOSIS — Z7984 Long term (current) use of oral hypoglycemic drugs: Secondary | ICD-10-CM | POA: Diagnosis not present

## 2016-10-20 DIAGNOSIS — Z7982 Long term (current) use of aspirin: Secondary | ICD-10-CM | POA: Diagnosis not present

## 2016-10-20 DIAGNOSIS — M5440 Lumbago with sciatica, unspecified side: Secondary | ICD-10-CM | POA: Diagnosis not present

## 2016-10-20 DIAGNOSIS — K219 Gastro-esophageal reflux disease without esophagitis: Secondary | ICD-10-CM | POA: Diagnosis not present

## 2016-10-20 DIAGNOSIS — Z8673 Personal history of transient ischemic attack (TIA), and cerebral infarction without residual deficits: Secondary | ICD-10-CM | POA: Diagnosis not present

## 2016-10-20 DIAGNOSIS — I10 Essential (primary) hypertension: Secondary | ICD-10-CM | POA: Diagnosis not present

## 2016-10-20 DIAGNOSIS — Z79899 Other long term (current) drug therapy: Secondary | ICD-10-CM | POA: Diagnosis not present

## 2016-10-20 DIAGNOSIS — G894 Chronic pain syndrome: Secondary | ICD-10-CM | POA: Diagnosis not present

## 2016-10-20 DIAGNOSIS — Z880 Allergy status to penicillin: Secondary | ICD-10-CM | POA: Diagnosis not present

## 2016-10-20 DIAGNOSIS — E119 Type 2 diabetes mellitus without complications: Secondary | ICD-10-CM | POA: Diagnosis not present

## 2016-10-23 DIAGNOSIS — E119 Type 2 diabetes mellitus without complications: Secondary | ICD-10-CM | POA: Diagnosis not present

## 2016-10-23 DIAGNOSIS — I10 Essential (primary) hypertension: Secondary | ICD-10-CM | POA: Diagnosis not present

## 2016-10-23 DIAGNOSIS — D509 Iron deficiency anemia, unspecified: Secondary | ICD-10-CM | POA: Diagnosis not present

## 2016-10-23 DIAGNOSIS — K219 Gastro-esophageal reflux disease without esophagitis: Secondary | ICD-10-CM | POA: Diagnosis not present

## 2016-10-23 DIAGNOSIS — M5416 Radiculopathy, lumbar region: Secondary | ICD-10-CM | POA: Diagnosis not present

## 2016-10-23 DIAGNOSIS — M79605 Pain in left leg: Secondary | ICD-10-CM | POA: Diagnosis not present

## 2016-10-24 DIAGNOSIS — M5416 Radiculopathy, lumbar region: Secondary | ICD-10-CM | POA: Diagnosis not present

## 2016-10-24 DIAGNOSIS — K219 Gastro-esophageal reflux disease without esophagitis: Secondary | ICD-10-CM | POA: Diagnosis not present

## 2016-10-24 DIAGNOSIS — D509 Iron deficiency anemia, unspecified: Secondary | ICD-10-CM | POA: Diagnosis not present

## 2016-10-24 DIAGNOSIS — M79605 Pain in left leg: Secondary | ICD-10-CM | POA: Diagnosis not present

## 2016-10-24 DIAGNOSIS — E119 Type 2 diabetes mellitus without complications: Secondary | ICD-10-CM | POA: Diagnosis not present

## 2016-10-24 DIAGNOSIS — I10 Essential (primary) hypertension: Secondary | ICD-10-CM | POA: Diagnosis not present

## 2016-10-25 DIAGNOSIS — M545 Low back pain: Secondary | ICD-10-CM | POA: Diagnosis not present

## 2016-10-25 DIAGNOSIS — M25561 Pain in right knee: Secondary | ICD-10-CM | POA: Diagnosis not present

## 2016-10-25 DIAGNOSIS — M533 Sacrococcygeal disorders, not elsewhere classified: Secondary | ICD-10-CM | POA: Diagnosis not present

## 2016-10-25 DIAGNOSIS — G8929 Other chronic pain: Secondary | ICD-10-CM | POA: Diagnosis not present

## 2016-10-25 DIAGNOSIS — H353211 Exudative age-related macular degeneration, right eye, with active choroidal neovascularization: Secondary | ICD-10-CM | POA: Diagnosis not present

## 2016-10-25 DIAGNOSIS — M79605 Pain in left leg: Secondary | ICD-10-CM | POA: Diagnosis not present

## 2016-10-26 DIAGNOSIS — I10 Essential (primary) hypertension: Secondary | ICD-10-CM | POA: Diagnosis not present

## 2016-10-26 DIAGNOSIS — M5416 Radiculopathy, lumbar region: Secondary | ICD-10-CM | POA: Diagnosis not present

## 2016-10-26 DIAGNOSIS — E119 Type 2 diabetes mellitus without complications: Secondary | ICD-10-CM | POA: Diagnosis not present

## 2016-10-26 DIAGNOSIS — D509 Iron deficiency anemia, unspecified: Secondary | ICD-10-CM | POA: Diagnosis not present

## 2016-10-26 DIAGNOSIS — K219 Gastro-esophageal reflux disease without esophagitis: Secondary | ICD-10-CM | POA: Diagnosis not present

## 2016-10-26 DIAGNOSIS — M79605 Pain in left leg: Secondary | ICD-10-CM | POA: Diagnosis not present

## 2016-10-31 DIAGNOSIS — K219 Gastro-esophageal reflux disease without esophagitis: Secondary | ICD-10-CM | POA: Diagnosis not present

## 2016-10-31 DIAGNOSIS — E119 Type 2 diabetes mellitus without complications: Secondary | ICD-10-CM | POA: Diagnosis not present

## 2016-10-31 DIAGNOSIS — M79605 Pain in left leg: Secondary | ICD-10-CM | POA: Diagnosis not present

## 2016-10-31 DIAGNOSIS — D509 Iron deficiency anemia, unspecified: Secondary | ICD-10-CM | POA: Diagnosis not present

## 2016-10-31 DIAGNOSIS — M5416 Radiculopathy, lumbar region: Secondary | ICD-10-CM | POA: Diagnosis not present

## 2016-10-31 DIAGNOSIS — I10 Essential (primary) hypertension: Secondary | ICD-10-CM | POA: Diagnosis not present

## 2016-11-01 DIAGNOSIS — K219 Gastro-esophageal reflux disease without esophagitis: Secondary | ICD-10-CM | POA: Diagnosis not present

## 2016-11-01 DIAGNOSIS — D509 Iron deficiency anemia, unspecified: Secondary | ICD-10-CM | POA: Diagnosis not present

## 2016-11-01 DIAGNOSIS — E119 Type 2 diabetes mellitus without complications: Secondary | ICD-10-CM | POA: Diagnosis not present

## 2016-11-01 DIAGNOSIS — M5416 Radiculopathy, lumbar region: Secondary | ICD-10-CM | POA: Diagnosis not present

## 2016-11-01 DIAGNOSIS — I10 Essential (primary) hypertension: Secondary | ICD-10-CM | POA: Diagnosis not present

## 2016-11-01 DIAGNOSIS — M79605 Pain in left leg: Secondary | ICD-10-CM | POA: Diagnosis not present

## 2016-11-08 DIAGNOSIS — D631 Anemia in chronic kidney disease: Secondary | ICD-10-CM | POA: Diagnosis not present

## 2016-11-08 DIAGNOSIS — I10 Essential (primary) hypertension: Secondary | ICD-10-CM | POA: Diagnosis not present

## 2016-11-08 DIAGNOSIS — Z6831 Body mass index (BMI) 31.0-31.9, adult: Secondary | ICD-10-CM | POA: Diagnosis not present

## 2016-11-08 DIAGNOSIS — D500 Iron deficiency anemia secondary to blood loss (chronic): Secondary | ICD-10-CM | POA: Diagnosis not present

## 2016-11-08 DIAGNOSIS — N184 Chronic kidney disease, stage 4 (severe): Secondary | ICD-10-CM | POA: Diagnosis not present

## 2016-11-08 DIAGNOSIS — J9611 Chronic respiratory failure with hypoxia: Secondary | ICD-10-CM | POA: Diagnosis not present

## 2016-11-09 DIAGNOSIS — D509 Iron deficiency anemia, unspecified: Secondary | ICD-10-CM | POA: Diagnosis not present

## 2016-11-09 DIAGNOSIS — K219 Gastro-esophageal reflux disease without esophagitis: Secondary | ICD-10-CM | POA: Diagnosis not present

## 2016-11-09 DIAGNOSIS — M5416 Radiculopathy, lumbar region: Secondary | ICD-10-CM | POA: Diagnosis not present

## 2016-11-09 DIAGNOSIS — I10 Essential (primary) hypertension: Secondary | ICD-10-CM | POA: Diagnosis not present

## 2016-11-09 DIAGNOSIS — E119 Type 2 diabetes mellitus without complications: Secondary | ICD-10-CM | POA: Diagnosis not present

## 2016-11-09 DIAGNOSIS — M79605 Pain in left leg: Secondary | ICD-10-CM | POA: Diagnosis not present

## 2016-11-15 DIAGNOSIS — M533 Sacrococcygeal disorders, not elsewhere classified: Secondary | ICD-10-CM | POA: Diagnosis not present

## 2016-11-15 DIAGNOSIS — M25561 Pain in right knee: Secondary | ICD-10-CM | POA: Diagnosis not present

## 2016-11-15 DIAGNOSIS — M545 Low back pain: Secondary | ICD-10-CM | POA: Diagnosis not present

## 2016-11-15 DIAGNOSIS — G8929 Other chronic pain: Secondary | ICD-10-CM | POA: Diagnosis not present

## 2016-11-15 DIAGNOSIS — M79605 Pain in left leg: Secondary | ICD-10-CM | POA: Diagnosis not present

## 2016-11-20 DIAGNOSIS — D631 Anemia in chronic kidney disease: Secondary | ICD-10-CM | POA: Diagnosis not present

## 2016-11-20 DIAGNOSIS — K210 Gastro-esophageal reflux disease with esophagitis: Secondary | ICD-10-CM | POA: Diagnosis not present

## 2016-11-20 DIAGNOSIS — D500 Iron deficiency anemia secondary to blood loss (chronic): Secondary | ICD-10-CM | POA: Diagnosis not present

## 2016-11-20 DIAGNOSIS — E039 Hypothyroidism, unspecified: Secondary | ICD-10-CM | POA: Diagnosis not present

## 2016-11-20 DIAGNOSIS — D519 Vitamin B12 deficiency anemia, unspecified: Secondary | ICD-10-CM | POA: Diagnosis not present

## 2016-11-20 DIAGNOSIS — E1122 Type 2 diabetes mellitus with diabetic chronic kidney disease: Secondary | ICD-10-CM | POA: Diagnosis not present

## 2016-11-20 DIAGNOSIS — Z0001 Encounter for general adult medical examination with abnormal findings: Secondary | ICD-10-CM | POA: Diagnosis not present

## 2016-11-20 DIAGNOSIS — Z23 Encounter for immunization: Secondary | ICD-10-CM | POA: Diagnosis not present

## 2016-11-20 DIAGNOSIS — I10 Essential (primary) hypertension: Secondary | ICD-10-CM | POA: Diagnosis not present

## 2016-11-20 DIAGNOSIS — Z9189 Other specified personal risk factors, not elsewhere classified: Secondary | ICD-10-CM | POA: Diagnosis not present

## 2016-11-20 DIAGNOSIS — D529 Folate deficiency anemia, unspecified: Secondary | ICD-10-CM | POA: Diagnosis not present

## 2016-11-20 DIAGNOSIS — N184 Chronic kidney disease, stage 4 (severe): Secondary | ICD-10-CM | POA: Diagnosis not present

## 2016-11-20 DIAGNOSIS — E875 Hyperkalemia: Secondary | ICD-10-CM | POA: Diagnosis not present

## 2016-11-20 DIAGNOSIS — Z6830 Body mass index (BMI) 30.0-30.9, adult: Secondary | ICD-10-CM | POA: Diagnosis not present

## 2016-11-20 DIAGNOSIS — E782 Mixed hyperlipidemia: Secondary | ICD-10-CM | POA: Diagnosis not present

## 2016-11-22 DIAGNOSIS — F331 Major depressive disorder, recurrent, moderate: Secondary | ICD-10-CM | POA: Diagnosis not present

## 2016-11-22 DIAGNOSIS — I714 Abdominal aortic aneurysm, without rupture: Secondary | ICD-10-CM | POA: Diagnosis not present

## 2016-11-22 DIAGNOSIS — E782 Mixed hyperlipidemia: Secondary | ICD-10-CM | POA: Diagnosis not present

## 2016-11-22 DIAGNOSIS — Z6830 Body mass index (BMI) 30.0-30.9, adult: Secondary | ICD-10-CM | POA: Diagnosis not present

## 2016-11-22 DIAGNOSIS — E039 Hypothyroidism, unspecified: Secondary | ICD-10-CM | POA: Diagnosis not present

## 2016-11-22 DIAGNOSIS — I872 Venous insufficiency (chronic) (peripheral): Secondary | ICD-10-CM | POA: Diagnosis not present

## 2016-11-22 DIAGNOSIS — D692 Other nonthrombocytopenic purpura: Secondary | ICD-10-CM | POA: Diagnosis not present

## 2016-11-22 DIAGNOSIS — E1122 Type 2 diabetes mellitus with diabetic chronic kidney disease: Secondary | ICD-10-CM | POA: Diagnosis not present

## 2016-11-22 DIAGNOSIS — D500 Iron deficiency anemia secondary to blood loss (chronic): Secondary | ICD-10-CM | POA: Diagnosis not present

## 2016-11-22 DIAGNOSIS — I10 Essential (primary) hypertension: Secondary | ICD-10-CM | POA: Diagnosis not present

## 2016-11-23 DIAGNOSIS — M79605 Pain in left leg: Secondary | ICD-10-CM | POA: Diagnosis not present

## 2016-11-23 DIAGNOSIS — D509 Iron deficiency anemia, unspecified: Secondary | ICD-10-CM | POA: Diagnosis not present

## 2016-11-23 DIAGNOSIS — M5416 Radiculopathy, lumbar region: Secondary | ICD-10-CM | POA: Diagnosis not present

## 2016-11-23 DIAGNOSIS — I10 Essential (primary) hypertension: Secondary | ICD-10-CM | POA: Diagnosis not present

## 2016-11-23 DIAGNOSIS — K219 Gastro-esophageal reflux disease without esophagitis: Secondary | ICD-10-CM | POA: Diagnosis not present

## 2016-11-23 DIAGNOSIS — E119 Type 2 diabetes mellitus without complications: Secondary | ICD-10-CM | POA: Diagnosis not present

## 2016-12-05 DIAGNOSIS — H353211 Exudative age-related macular degeneration, right eye, with active choroidal neovascularization: Secondary | ICD-10-CM | POA: Diagnosis not present

## 2016-12-07 DIAGNOSIS — K219 Gastro-esophageal reflux disease without esophagitis: Secondary | ICD-10-CM | POA: Diagnosis not present

## 2016-12-07 DIAGNOSIS — D509 Iron deficiency anemia, unspecified: Secondary | ICD-10-CM | POA: Diagnosis not present

## 2016-12-07 DIAGNOSIS — M5416 Radiculopathy, lumbar region: Secondary | ICD-10-CM | POA: Diagnosis not present

## 2016-12-07 DIAGNOSIS — M79605 Pain in left leg: Secondary | ICD-10-CM | POA: Diagnosis not present

## 2016-12-07 DIAGNOSIS — E119 Type 2 diabetes mellitus without complications: Secondary | ICD-10-CM | POA: Diagnosis not present

## 2016-12-07 DIAGNOSIS — I10 Essential (primary) hypertension: Secondary | ICD-10-CM | POA: Diagnosis not present

## 2016-12-13 DIAGNOSIS — M5416 Radiculopathy, lumbar region: Secondary | ICD-10-CM | POA: Diagnosis not present

## 2016-12-13 DIAGNOSIS — D509 Iron deficiency anemia, unspecified: Secondary | ICD-10-CM | POA: Diagnosis not present

## 2016-12-13 DIAGNOSIS — M79605 Pain in left leg: Secondary | ICD-10-CM | POA: Diagnosis not present

## 2016-12-13 DIAGNOSIS — E119 Type 2 diabetes mellitus without complications: Secondary | ICD-10-CM | POA: Diagnosis not present

## 2016-12-14 DIAGNOSIS — H353221 Exudative age-related macular degeneration, left eye, with active choroidal neovascularization: Secondary | ICD-10-CM | POA: Diagnosis not present

## 2017-01-19 DIAGNOSIS — H353211 Exudative age-related macular degeneration, right eye, with active choroidal neovascularization: Secondary | ICD-10-CM | POA: Diagnosis not present

## 2017-01-24 DIAGNOSIS — S8001XA Contusion of right knee, initial encounter: Secondary | ICD-10-CM | POA: Diagnosis not present

## 2017-01-24 DIAGNOSIS — R05 Cough: Secondary | ICD-10-CM | POA: Diagnosis not present

## 2017-01-24 DIAGNOSIS — S20211A Contusion of right front wall of thorax, initial encounter: Secondary | ICD-10-CM | POA: Diagnosis not present

## 2017-01-29 DIAGNOSIS — H353221 Exudative age-related macular degeneration, left eye, with active choroidal neovascularization: Secondary | ICD-10-CM | POA: Diagnosis not present

## 2017-02-28 DIAGNOSIS — H353211 Exudative age-related macular degeneration, right eye, with active choroidal neovascularization: Secondary | ICD-10-CM | POA: Diagnosis not present

## 2017-03-15 DIAGNOSIS — N184 Chronic kidney disease, stage 4 (severe): Secondary | ICD-10-CM | POA: Diagnosis not present

## 2017-03-15 DIAGNOSIS — K210 Gastro-esophageal reflux disease with esophagitis: Secondary | ICD-10-CM | POA: Diagnosis not present

## 2017-03-15 DIAGNOSIS — E875 Hyperkalemia: Secondary | ICD-10-CM | POA: Diagnosis not present

## 2017-03-15 DIAGNOSIS — D500 Iron deficiency anemia secondary to blood loss (chronic): Secondary | ICD-10-CM | POA: Diagnosis not present

## 2017-03-15 DIAGNOSIS — I10 Essential (primary) hypertension: Secondary | ICD-10-CM | POA: Diagnosis not present

## 2017-03-15 DIAGNOSIS — E039 Hypothyroidism, unspecified: Secondary | ICD-10-CM | POA: Diagnosis not present

## 2017-03-15 DIAGNOSIS — Z9189 Other specified personal risk factors, not elsewhere classified: Secondary | ICD-10-CM | POA: Diagnosis not present

## 2017-03-15 DIAGNOSIS — D631 Anemia in chronic kidney disease: Secondary | ICD-10-CM | POA: Diagnosis not present

## 2017-03-15 DIAGNOSIS — E782 Mixed hyperlipidemia: Secondary | ICD-10-CM | POA: Diagnosis not present

## 2017-03-15 DIAGNOSIS — E1122 Type 2 diabetes mellitus with diabetic chronic kidney disease: Secondary | ICD-10-CM | POA: Diagnosis not present

## 2017-03-15 DIAGNOSIS — R42 Dizziness and giddiness: Secondary | ICD-10-CM | POA: Diagnosis not present

## 2017-03-20 DIAGNOSIS — Z6830 Body mass index (BMI) 30.0-30.9, adult: Secondary | ICD-10-CM | POA: Diagnosis not present

## 2017-03-20 DIAGNOSIS — M1711 Unilateral primary osteoarthritis, right knee: Secondary | ICD-10-CM | POA: Diagnosis not present

## 2017-03-20 DIAGNOSIS — I10 Essential (primary) hypertension: Secondary | ICD-10-CM | POA: Diagnosis not present

## 2017-03-20 DIAGNOSIS — E039 Hypothyroidism, unspecified: Secondary | ICD-10-CM | POA: Diagnosis not present

## 2017-03-20 DIAGNOSIS — I714 Abdominal aortic aneurysm, without rupture: Secondary | ICD-10-CM | POA: Diagnosis not present

## 2017-03-20 DIAGNOSIS — E1122 Type 2 diabetes mellitus with diabetic chronic kidney disease: Secondary | ICD-10-CM | POA: Diagnosis not present

## 2017-03-20 DIAGNOSIS — D500 Iron deficiency anemia secondary to blood loss (chronic): Secondary | ICD-10-CM | POA: Diagnosis not present

## 2017-03-20 DIAGNOSIS — N184 Chronic kidney disease, stage 4 (severe): Secondary | ICD-10-CM | POA: Diagnosis not present

## 2017-03-21 DIAGNOSIS — H353221 Exudative age-related macular degeneration, left eye, with active choroidal neovascularization: Secondary | ICD-10-CM | POA: Diagnosis not present

## 2017-04-03 DIAGNOSIS — H353211 Exudative age-related macular degeneration, right eye, with active choroidal neovascularization: Secondary | ICD-10-CM | POA: Diagnosis not present

## 2017-04-05 DIAGNOSIS — H9190 Unspecified hearing loss, unspecified ear: Secondary | ICD-10-CM | POA: Diagnosis not present

## 2017-04-05 DIAGNOSIS — Z09 Encounter for follow-up examination after completed treatment for conditions other than malignant neoplasm: Secondary | ICD-10-CM | POA: Diagnosis not present

## 2017-04-05 DIAGNOSIS — R42 Dizziness and giddiness: Secondary | ICD-10-CM | POA: Diagnosis not present

## 2017-04-05 DIAGNOSIS — I716 Thoracoabdominal aortic aneurysm, without rupture: Secondary | ICD-10-CM | POA: Diagnosis not present

## 2017-04-05 DIAGNOSIS — Z87891 Personal history of nicotine dependence: Secondary | ICD-10-CM | POA: Diagnosis not present

## 2017-04-05 DIAGNOSIS — Z006 Encounter for examination for normal comparison and control in clinical research program: Secondary | ICD-10-CM | POA: Diagnosis not present

## 2017-04-05 DIAGNOSIS — M549 Dorsalgia, unspecified: Secondary | ICD-10-CM | POA: Diagnosis not present

## 2017-04-05 DIAGNOSIS — I10 Essential (primary) hypertension: Secondary | ICD-10-CM | POA: Diagnosis not present

## 2017-04-05 DIAGNOSIS — I715 Thoracoabdominal aortic aneurysm, ruptured: Secondary | ICD-10-CM | POA: Diagnosis not present

## 2017-04-05 DIAGNOSIS — I714 Abdominal aortic aneurysm, without rupture: Secondary | ICD-10-CM | POA: Diagnosis not present

## 2017-04-05 DIAGNOSIS — R296 Repeated falls: Secondary | ICD-10-CM | POA: Diagnosis not present

## 2017-04-05 DIAGNOSIS — R911 Solitary pulmonary nodule: Secondary | ICD-10-CM | POA: Diagnosis not present

## 2017-04-05 DIAGNOSIS — Z95828 Presence of other vascular implants and grafts: Secondary | ICD-10-CM | POA: Diagnosis not present

## 2017-05-09 DIAGNOSIS — H353221 Exudative age-related macular degeneration, left eye, with active choroidal neovascularization: Secondary | ICD-10-CM | POA: Diagnosis not present

## 2017-05-15 DIAGNOSIS — H353211 Exudative age-related macular degeneration, right eye, with active choroidal neovascularization: Secondary | ICD-10-CM | POA: Diagnosis not present

## 2017-05-17 DIAGNOSIS — M5416 Radiculopathy, lumbar region: Secondary | ICD-10-CM | POA: Diagnosis not present

## 2017-05-17 DIAGNOSIS — I716 Thoracoabdominal aortic aneurysm, without rupture: Secondary | ICD-10-CM | POA: Diagnosis not present

## 2017-05-28 DIAGNOSIS — M5416 Radiculopathy, lumbar region: Secondary | ICD-10-CM | POA: Diagnosis not present

## 2017-05-28 DIAGNOSIS — E1140 Type 2 diabetes mellitus with diabetic neuropathy, unspecified: Secondary | ICD-10-CM | POA: Diagnosis not present

## 2017-06-07 DIAGNOSIS — M5440 Lumbago with sciatica, unspecified side: Secondary | ICD-10-CM | POA: Diagnosis not present

## 2017-06-07 DIAGNOSIS — M4316 Spondylolisthesis, lumbar region: Secondary | ICD-10-CM | POA: Diagnosis not present

## 2017-06-07 DIAGNOSIS — M4686 Other specified inflammatory spondylopathies, lumbar region: Secondary | ICD-10-CM | POA: Diagnosis not present

## 2017-06-07 DIAGNOSIS — M48061 Spinal stenosis, lumbar region without neurogenic claudication: Secondary | ICD-10-CM | POA: Diagnosis not present

## 2017-06-13 DIAGNOSIS — E875 Hyperkalemia: Secondary | ICD-10-CM | POA: Diagnosis not present

## 2017-06-13 DIAGNOSIS — K210 Gastro-esophageal reflux disease with esophagitis: Secondary | ICD-10-CM | POA: Diagnosis not present

## 2017-06-13 DIAGNOSIS — E1122 Type 2 diabetes mellitus with diabetic chronic kidney disease: Secondary | ICD-10-CM | POA: Diagnosis not present

## 2017-06-13 DIAGNOSIS — I10 Essential (primary) hypertension: Secondary | ICD-10-CM | POA: Diagnosis not present

## 2017-06-13 DIAGNOSIS — E782 Mixed hyperlipidemia: Secondary | ICD-10-CM | POA: Diagnosis not present

## 2017-06-13 DIAGNOSIS — N189 Chronic kidney disease, unspecified: Secondary | ICD-10-CM | POA: Diagnosis not present

## 2017-06-13 DIAGNOSIS — Z9189 Other specified personal risk factors, not elsewhere classified: Secondary | ICD-10-CM | POA: Diagnosis not present

## 2017-06-15 DIAGNOSIS — M5416 Radiculopathy, lumbar region: Secondary | ICD-10-CM | POA: Diagnosis not present

## 2017-06-15 DIAGNOSIS — M5137 Other intervertebral disc degeneration, lumbosacral region: Secondary | ICD-10-CM | POA: Diagnosis not present

## 2017-06-19 DIAGNOSIS — H353221 Exudative age-related macular degeneration, left eye, with active choroidal neovascularization: Secondary | ICD-10-CM | POA: Diagnosis not present

## 2017-06-20 DIAGNOSIS — Z6832 Body mass index (BMI) 32.0-32.9, adult: Secondary | ICD-10-CM | POA: Diagnosis not present

## 2017-06-20 DIAGNOSIS — I714 Abdominal aortic aneurysm, without rupture: Secondary | ICD-10-CM | POA: Diagnosis not present

## 2017-06-20 DIAGNOSIS — I10 Essential (primary) hypertension: Secondary | ICD-10-CM | POA: Diagnosis not present

## 2017-06-20 DIAGNOSIS — E782 Mixed hyperlipidemia: Secondary | ICD-10-CM | POA: Diagnosis not present

## 2017-06-20 DIAGNOSIS — N184 Chronic kidney disease, stage 4 (severe): Secondary | ICD-10-CM | POA: Diagnosis not present

## 2017-06-20 DIAGNOSIS — E1122 Type 2 diabetes mellitus with diabetic chronic kidney disease: Secondary | ICD-10-CM | POA: Diagnosis not present

## 2017-06-20 DIAGNOSIS — D500 Iron deficiency anemia secondary to blood loss (chronic): Secondary | ICD-10-CM | POA: Diagnosis not present

## 2017-06-20 DIAGNOSIS — E039 Hypothyroidism, unspecified: Secondary | ICD-10-CM | POA: Diagnosis not present

## 2017-06-25 DIAGNOSIS — H353211 Exudative age-related macular degeneration, right eye, with active choroidal neovascularization: Secondary | ICD-10-CM | POA: Diagnosis not present

## 2017-07-11 DIAGNOSIS — M5137 Other intervertebral disc degeneration, lumbosacral region: Secondary | ICD-10-CM | POA: Diagnosis not present

## 2017-07-11 DIAGNOSIS — M5416 Radiculopathy, lumbar region: Secondary | ICD-10-CM | POA: Diagnosis not present

## 2017-07-30 DIAGNOSIS — H353211 Exudative age-related macular degeneration, right eye, with active choroidal neovascularization: Secondary | ICD-10-CM | POA: Diagnosis not present

## 2017-08-06 DIAGNOSIS — H353221 Exudative age-related macular degeneration, left eye, with active choroidal neovascularization: Secondary | ICD-10-CM | POA: Diagnosis not present

## 2017-08-17 DIAGNOSIS — Z6830 Body mass index (BMI) 30.0-30.9, adult: Secondary | ICD-10-CM | POA: Diagnosis not present

## 2017-08-17 DIAGNOSIS — M5416 Radiculopathy, lumbar region: Secondary | ICD-10-CM | POA: Diagnosis not present

## 2017-08-22 DIAGNOSIS — L570 Actinic keratosis: Secondary | ICD-10-CM | POA: Diagnosis not present

## 2017-08-22 DIAGNOSIS — I872 Venous insufficiency (chronic) (peripheral): Secondary | ICD-10-CM | POA: Diagnosis not present

## 2017-08-22 DIAGNOSIS — D485 Neoplasm of uncertain behavior of skin: Secondary | ICD-10-CM | POA: Diagnosis not present

## 2017-08-22 DIAGNOSIS — Z85828 Personal history of other malignant neoplasm of skin: Secondary | ICD-10-CM | POA: Diagnosis not present

## 2017-08-22 DIAGNOSIS — D2261 Melanocytic nevi of right upper limb, including shoulder: Secondary | ICD-10-CM | POA: Diagnosis not present

## 2017-08-22 DIAGNOSIS — L281 Prurigo nodularis: Secondary | ICD-10-CM | POA: Diagnosis not present

## 2017-09-03 DIAGNOSIS — H353211 Exudative age-related macular degeneration, right eye, with active choroidal neovascularization: Secondary | ICD-10-CM | POA: Diagnosis not present

## 2017-09-12 DIAGNOSIS — M5416 Radiculopathy, lumbar region: Secondary | ICD-10-CM | POA: Diagnosis not present

## 2017-09-24 DIAGNOSIS — E875 Hyperkalemia: Secondary | ICD-10-CM | POA: Diagnosis not present

## 2017-09-24 DIAGNOSIS — D509 Iron deficiency anemia, unspecified: Secondary | ICD-10-CM | POA: Diagnosis not present

## 2017-09-24 DIAGNOSIS — K210 Gastro-esophageal reflux disease with esophagitis: Secondary | ICD-10-CM | POA: Diagnosis not present

## 2017-09-24 DIAGNOSIS — D519 Vitamin B12 deficiency anemia, unspecified: Secondary | ICD-10-CM | POA: Diagnosis not present

## 2017-09-24 DIAGNOSIS — N184 Chronic kidney disease, stage 4 (severe): Secondary | ICD-10-CM | POA: Diagnosis not present

## 2017-09-24 DIAGNOSIS — E039 Hypothyroidism, unspecified: Secondary | ICD-10-CM | POA: Diagnosis not present

## 2017-09-24 DIAGNOSIS — D500 Iron deficiency anemia secondary to blood loss (chronic): Secondary | ICD-10-CM | POA: Diagnosis not present

## 2017-09-24 DIAGNOSIS — D529 Folate deficiency anemia, unspecified: Secondary | ICD-10-CM | POA: Diagnosis not present

## 2017-09-24 DIAGNOSIS — Z9189 Other specified personal risk factors, not elsewhere classified: Secondary | ICD-10-CM | POA: Diagnosis not present

## 2017-09-24 DIAGNOSIS — E1122 Type 2 diabetes mellitus with diabetic chronic kidney disease: Secondary | ICD-10-CM | POA: Diagnosis not present

## 2017-09-24 DIAGNOSIS — I10 Essential (primary) hypertension: Secondary | ICD-10-CM | POA: Diagnosis not present

## 2017-09-27 DIAGNOSIS — I10 Essential (primary) hypertension: Secondary | ICD-10-CM | POA: Diagnosis not present

## 2017-09-27 DIAGNOSIS — E782 Mixed hyperlipidemia: Secondary | ICD-10-CM | POA: Diagnosis not present

## 2017-09-27 DIAGNOSIS — E039 Hypothyroidism, unspecified: Secondary | ICD-10-CM | POA: Diagnosis not present

## 2017-09-27 DIAGNOSIS — N184 Chronic kidney disease, stage 4 (severe): Secondary | ICD-10-CM | POA: Diagnosis not present

## 2017-09-27 DIAGNOSIS — I714 Abdominal aortic aneurysm, without rupture: Secondary | ICD-10-CM | POA: Diagnosis not present

## 2017-09-27 DIAGNOSIS — Z6830 Body mass index (BMI) 30.0-30.9, adult: Secondary | ICD-10-CM | POA: Diagnosis not present

## 2017-09-27 DIAGNOSIS — D500 Iron deficiency anemia secondary to blood loss (chronic): Secondary | ICD-10-CM | POA: Diagnosis not present

## 2017-09-27 DIAGNOSIS — E1122 Type 2 diabetes mellitus with diabetic chronic kidney disease: Secondary | ICD-10-CM | POA: Diagnosis not present

## 2017-09-28 DIAGNOSIS — M25561 Pain in right knee: Secondary | ICD-10-CM | POA: Diagnosis not present

## 2017-09-28 DIAGNOSIS — M1711 Unilateral primary osteoarthritis, right knee: Secondary | ICD-10-CM | POA: Diagnosis not present

## 2017-10-02 DIAGNOSIS — D649 Anemia, unspecified: Secondary | ICD-10-CM | POA: Diagnosis not present

## 2017-10-05 DIAGNOSIS — H353221 Exudative age-related macular degeneration, left eye, with active choroidal neovascularization: Secondary | ICD-10-CM | POA: Diagnosis not present

## 2017-10-08 DIAGNOSIS — D649 Anemia, unspecified: Secondary | ICD-10-CM | POA: Diagnosis not present

## 2017-10-11 DIAGNOSIS — H353211 Exudative age-related macular degeneration, right eye, with active choroidal neovascularization: Secondary | ICD-10-CM | POA: Diagnosis not present

## 2017-11-05 DIAGNOSIS — M1711 Unilateral primary osteoarthritis, right knee: Secondary | ICD-10-CM | POA: Diagnosis not present

## 2017-11-06 DIAGNOSIS — M48062 Spinal stenosis, lumbar region with neurogenic claudication: Secondary | ICD-10-CM | POA: Diagnosis not present

## 2017-11-06 DIAGNOSIS — Z6833 Body mass index (BMI) 33.0-33.9, adult: Secondary | ICD-10-CM | POA: Diagnosis not present

## 2017-11-06 DIAGNOSIS — M4316 Spondylolisthesis, lumbar region: Secondary | ICD-10-CM | POA: Diagnosis not present

## 2017-11-07 DIAGNOSIS — D519 Vitamin B12 deficiency anemia, unspecified: Secondary | ICD-10-CM | POA: Diagnosis not present

## 2017-11-07 DIAGNOSIS — D631 Anemia in chronic kidney disease: Secondary | ICD-10-CM | POA: Diagnosis not present

## 2017-11-12 DIAGNOSIS — M1711 Unilateral primary osteoarthritis, right knee: Secondary | ICD-10-CM | POA: Diagnosis not present

## 2017-11-13 DIAGNOSIS — H353211 Exudative age-related macular degeneration, right eye, with active choroidal neovascularization: Secondary | ICD-10-CM | POA: Diagnosis not present

## 2017-11-15 DIAGNOSIS — H353221 Exudative age-related macular degeneration, left eye, with active choroidal neovascularization: Secondary | ICD-10-CM | POA: Diagnosis not present

## 2017-11-16 DIAGNOSIS — M5126 Other intervertebral disc displacement, lumbar region: Secondary | ICD-10-CM | POA: Diagnosis not present

## 2017-11-16 DIAGNOSIS — M4316 Spondylolisthesis, lumbar region: Secondary | ICD-10-CM | POA: Diagnosis not present

## 2017-11-16 DIAGNOSIS — M48061 Spinal stenosis, lumbar region without neurogenic claudication: Secondary | ICD-10-CM | POA: Diagnosis not present

## 2017-11-16 DIAGNOSIS — M48062 Spinal stenosis, lumbar region with neurogenic claudication: Secondary | ICD-10-CM | POA: Diagnosis not present

## 2017-11-16 DIAGNOSIS — M5136 Other intervertebral disc degeneration, lumbar region: Secondary | ICD-10-CM | POA: Diagnosis not present

## 2017-11-16 DIAGNOSIS — M47816 Spondylosis without myelopathy or radiculopathy, lumbar region: Secondary | ICD-10-CM | POA: Diagnosis not present

## 2017-11-19 DIAGNOSIS — M1711 Unilateral primary osteoarthritis, right knee: Secondary | ICD-10-CM | POA: Diagnosis not present

## 2017-11-20 DIAGNOSIS — M48062 Spinal stenosis, lumbar region with neurogenic claudication: Secondary | ICD-10-CM | POA: Diagnosis not present

## 2017-11-20 DIAGNOSIS — Z6834 Body mass index (BMI) 34.0-34.9, adult: Secondary | ICD-10-CM | POA: Diagnosis not present

## 2017-11-20 DIAGNOSIS — M4316 Spondylolisthesis, lumbar region: Secondary | ICD-10-CM | POA: Diagnosis not present

## 2017-11-27 DIAGNOSIS — H43311 Vitreous membranes and strands, right eye: Secondary | ICD-10-CM | POA: Diagnosis not present

## 2017-11-27 DIAGNOSIS — H43391 Other vitreous opacities, right eye: Secondary | ICD-10-CM | POA: Diagnosis not present

## 2017-12-13 DIAGNOSIS — D509 Iron deficiency anemia, unspecified: Secondary | ICD-10-CM | POA: Diagnosis not present

## 2017-12-13 DIAGNOSIS — N184 Chronic kidney disease, stage 4 (severe): Secondary | ICD-10-CM | POA: Diagnosis not present

## 2017-12-13 DIAGNOSIS — J449 Chronic obstructive pulmonary disease, unspecified: Secondary | ICD-10-CM | POA: Diagnosis not present

## 2017-12-13 DIAGNOSIS — D519 Vitamin B12 deficiency anemia, unspecified: Secondary | ICD-10-CM | POA: Diagnosis not present

## 2017-12-13 DIAGNOSIS — I10 Essential (primary) hypertension: Secondary | ICD-10-CM | POA: Diagnosis not present

## 2017-12-13 DIAGNOSIS — D631 Anemia in chronic kidney disease: Secondary | ICD-10-CM | POA: Diagnosis not present

## 2017-12-13 DIAGNOSIS — D500 Iron deficiency anemia secondary to blood loss (chronic): Secondary | ICD-10-CM | POA: Diagnosis not present

## 2017-12-13 DIAGNOSIS — D529 Folate deficiency anemia, unspecified: Secondary | ICD-10-CM | POA: Diagnosis not present

## 2017-12-13 DIAGNOSIS — E1122 Type 2 diabetes mellitus with diabetic chronic kidney disease: Secondary | ICD-10-CM | POA: Diagnosis not present

## 2017-12-13 DIAGNOSIS — E782 Mixed hyperlipidemia: Secondary | ICD-10-CM | POA: Diagnosis not present

## 2017-12-18 DIAGNOSIS — M79671 Pain in right foot: Secondary | ICD-10-CM | POA: Diagnosis not present

## 2017-12-18 DIAGNOSIS — Z0001 Encounter for general adult medical examination with abnormal findings: Secondary | ICD-10-CM | POA: Diagnosis not present

## 2017-12-18 DIAGNOSIS — I10 Essential (primary) hypertension: Secondary | ICD-10-CM | POA: Diagnosis not present

## 2017-12-18 DIAGNOSIS — Z23 Encounter for immunization: Secondary | ICD-10-CM | POA: Diagnosis not present

## 2017-12-18 DIAGNOSIS — Z6832 Body mass index (BMI) 32.0-32.9, adult: Secondary | ICD-10-CM | POA: Diagnosis not present

## 2017-12-19 DIAGNOSIS — H353211 Exudative age-related macular degeneration, right eye, with active choroidal neovascularization: Secondary | ICD-10-CM | POA: Diagnosis not present

## 2017-12-28 DIAGNOSIS — Z01812 Encounter for preprocedural laboratory examination: Secondary | ICD-10-CM | POA: Diagnosis not present

## 2017-12-28 DIAGNOSIS — M48062 Spinal stenosis, lumbar region with neurogenic claudication: Secondary | ICD-10-CM | POA: Diagnosis not present

## 2017-12-28 DIAGNOSIS — Z01810 Encounter for preprocedural cardiovascular examination: Secondary | ICD-10-CM | POA: Diagnosis not present

## 2017-12-28 DIAGNOSIS — Z01811 Encounter for preprocedural respiratory examination: Secondary | ICD-10-CM | POA: Diagnosis not present

## 2017-12-28 DIAGNOSIS — J410 Simple chronic bronchitis: Secondary | ICD-10-CM | POA: Diagnosis not present

## 2017-12-31 DIAGNOSIS — M1711 Unilateral primary osteoarthritis, right knee: Secondary | ICD-10-CM | POA: Diagnosis not present

## 2018-01-01 DIAGNOSIS — H353221 Exudative age-related macular degeneration, left eye, with active choroidal neovascularization: Secondary | ICD-10-CM | POA: Diagnosis not present

## 2018-01-02 DIAGNOSIS — Z882 Allergy status to sulfonamides status: Secondary | ICD-10-CM | POA: Diagnosis not present

## 2018-01-02 DIAGNOSIS — Z79899 Other long term (current) drug therapy: Secondary | ICD-10-CM | POA: Diagnosis not present

## 2018-01-02 DIAGNOSIS — Z4789 Encounter for other orthopedic aftercare: Secondary | ICD-10-CM | POA: Diagnosis not present

## 2018-01-02 DIAGNOSIS — F411 Generalized anxiety disorder: Secondary | ICD-10-CM | POA: Diagnosis present

## 2018-01-02 DIAGNOSIS — I739 Peripheral vascular disease, unspecified: Secondary | ICD-10-CM | POA: Diagnosis not present

## 2018-01-02 DIAGNOSIS — G9519 Other vascular myelopathies: Secondary | ICD-10-CM | POA: Diagnosis not present

## 2018-01-02 DIAGNOSIS — E1142 Type 2 diabetes mellitus with diabetic polyneuropathy: Secondary | ICD-10-CM | POA: Diagnosis present

## 2018-01-02 DIAGNOSIS — I10 Essential (primary) hypertension: Secondary | ICD-10-CM | POA: Diagnosis present

## 2018-01-02 DIAGNOSIS — K219 Gastro-esophageal reflux disease without esophagitis: Secondary | ICD-10-CM | POA: Diagnosis present

## 2018-01-02 DIAGNOSIS — M1990 Unspecified osteoarthritis, unspecified site: Secondary | ICD-10-CM | POA: Diagnosis present

## 2018-01-02 DIAGNOSIS — E1151 Type 2 diabetes mellitus with diabetic peripheral angiopathy without gangrene: Secondary | ICD-10-CM | POA: Diagnosis present

## 2018-01-02 DIAGNOSIS — E1122 Type 2 diabetes mellitus with diabetic chronic kidney disease: Secondary | ICD-10-CM | POA: Diagnosis not present

## 2018-01-02 DIAGNOSIS — J309 Allergic rhinitis, unspecified: Secondary | ICD-10-CM | POA: Diagnosis present

## 2018-01-02 DIAGNOSIS — M4316 Spondylolisthesis, lumbar region: Secondary | ICD-10-CM | POA: Diagnosis present

## 2018-01-02 DIAGNOSIS — Z880 Allergy status to penicillin: Secondary | ICD-10-CM | POA: Diagnosis not present

## 2018-01-02 DIAGNOSIS — Z7984 Long term (current) use of oral hypoglycemic drugs: Secondary | ICD-10-CM | POA: Diagnosis not present

## 2018-01-02 DIAGNOSIS — E1140 Type 2 diabetes mellitus with diabetic neuropathy, unspecified: Secondary | ICD-10-CM | POA: Diagnosis not present

## 2018-01-02 DIAGNOSIS — Z8673 Personal history of transient ischemic attack (TIA), and cerebral infarction without residual deficits: Secondary | ICD-10-CM | POA: Diagnosis not present

## 2018-01-02 DIAGNOSIS — M48062 Spinal stenosis, lumbar region with neurogenic claudication: Secondary | ICD-10-CM | POA: Diagnosis present

## 2018-01-02 DIAGNOSIS — M47896 Other spondylosis, lumbar region: Secondary | ICD-10-CM | POA: Diagnosis present

## 2018-01-02 DIAGNOSIS — M4326 Fusion of spine, lumbar region: Secondary | ICD-10-CM | POA: Diagnosis not present

## 2018-01-02 DIAGNOSIS — Z7982 Long term (current) use of aspirin: Secondary | ICD-10-CM | POA: Diagnosis not present

## 2018-01-02 DIAGNOSIS — Z87891 Personal history of nicotine dependence: Secondary | ICD-10-CM | POA: Diagnosis not present

## 2018-01-02 DIAGNOSIS — E785 Hyperlipidemia, unspecified: Secondary | ICD-10-CM | POA: Diagnosis present

## 2018-01-02 DIAGNOSIS — R2689 Other abnormalities of gait and mobility: Secondary | ICD-10-CM | POA: Diagnosis not present

## 2018-01-02 DIAGNOSIS — R41841 Cognitive communication deficit: Secondary | ICD-10-CM | POA: Diagnosis not present

## 2018-01-02 DIAGNOSIS — M4306 Spondylolysis, lumbar region: Secondary | ICD-10-CM | POA: Diagnosis not present

## 2018-01-02 DIAGNOSIS — E039 Hypothyroidism, unspecified: Secondary | ICD-10-CM | POA: Diagnosis present

## 2018-01-02 DIAGNOSIS — K5900 Constipation, unspecified: Secondary | ICD-10-CM | POA: Diagnosis not present

## 2018-01-02 DIAGNOSIS — M4716 Other spondylosis with myelopathy, lumbar region: Secondary | ICD-10-CM | POA: Diagnosis not present

## 2018-01-02 DIAGNOSIS — M6281 Muscle weakness (generalized): Secondary | ICD-10-CM | POA: Diagnosis not present

## 2018-01-02 DIAGNOSIS — Z885 Allergy status to narcotic agent status: Secondary | ICD-10-CM | POA: Diagnosis not present

## 2018-01-02 HISTORY — PX: BACK SURGERY: SHX140

## 2018-01-08 DIAGNOSIS — I739 Peripheral vascular disease, unspecified: Secondary | ICD-10-CM | POA: Diagnosis not present

## 2018-01-08 DIAGNOSIS — Z4789 Encounter for other orthopedic aftercare: Secondary | ICD-10-CM | POA: Diagnosis not present

## 2018-01-08 DIAGNOSIS — M48062 Spinal stenosis, lumbar region with neurogenic claudication: Secondary | ICD-10-CM | POA: Diagnosis not present

## 2018-01-08 DIAGNOSIS — G9519 Other vascular myelopathies: Secondary | ICD-10-CM | POA: Diagnosis not present

## 2018-01-08 DIAGNOSIS — E1140 Type 2 diabetes mellitus with diabetic neuropathy, unspecified: Secondary | ICD-10-CM | POA: Diagnosis not present

## 2018-01-08 DIAGNOSIS — K219 Gastro-esophageal reflux disease without esophagitis: Secondary | ICD-10-CM | POA: Diagnosis not present

## 2018-01-08 DIAGNOSIS — M4316 Spondylolisthesis, lumbar region: Secondary | ICD-10-CM | POA: Diagnosis not present

## 2018-01-08 DIAGNOSIS — I10 Essential (primary) hypertension: Secondary | ICD-10-CM | POA: Diagnosis not present

## 2018-01-08 DIAGNOSIS — E1122 Type 2 diabetes mellitus with diabetic chronic kidney disease: Secondary | ICD-10-CM | POA: Diagnosis not present

## 2018-01-08 DIAGNOSIS — M6281 Muscle weakness (generalized): Secondary | ICD-10-CM | POA: Diagnosis not present

## 2018-01-08 DIAGNOSIS — E785 Hyperlipidemia, unspecified: Secondary | ICD-10-CM | POA: Diagnosis not present

## 2018-01-08 DIAGNOSIS — R2689 Other abnormalities of gait and mobility: Secondary | ICD-10-CM | POA: Diagnosis not present

## 2018-01-08 DIAGNOSIS — K5900 Constipation, unspecified: Secondary | ICD-10-CM | POA: Diagnosis not present

## 2018-01-08 DIAGNOSIS — E039 Hypothyroidism, unspecified: Secondary | ICD-10-CM | POA: Diagnosis not present

## 2018-01-08 DIAGNOSIS — F411 Generalized anxiety disorder: Secondary | ICD-10-CM | POA: Diagnosis not present

## 2018-01-08 DIAGNOSIS — R41841 Cognitive communication deficit: Secondary | ICD-10-CM | POA: Diagnosis not present

## 2018-01-08 DIAGNOSIS — M4306 Spondylolysis, lumbar region: Secondary | ICD-10-CM | POA: Diagnosis not present

## 2018-01-10 DIAGNOSIS — E1122 Type 2 diabetes mellitus with diabetic chronic kidney disease: Secondary | ICD-10-CM | POA: Diagnosis not present

## 2018-01-10 DIAGNOSIS — M48062 Spinal stenosis, lumbar region with neurogenic claudication: Secondary | ICD-10-CM | POA: Diagnosis not present

## 2018-01-27 DIAGNOSIS — Z4789 Encounter for other orthopedic aftercare: Secondary | ICD-10-CM | POA: Diagnosis not present

## 2018-01-27 DIAGNOSIS — R41841 Cognitive communication deficit: Secondary | ICD-10-CM | POA: Diagnosis not present

## 2018-01-27 DIAGNOSIS — I739 Peripheral vascular disease, unspecified: Secondary | ICD-10-CM | POA: Diagnosis not present

## 2018-01-27 DIAGNOSIS — E119 Type 2 diabetes mellitus without complications: Secondary | ICD-10-CM | POA: Diagnosis not present

## 2018-01-27 DIAGNOSIS — E1140 Type 2 diabetes mellitus with diabetic neuropathy, unspecified: Secondary | ICD-10-CM | POA: Diagnosis not present

## 2018-01-27 DIAGNOSIS — I10 Essential (primary) hypertension: Secondary | ICD-10-CM | POA: Diagnosis not present

## 2018-01-29 DIAGNOSIS — Z981 Arthrodesis status: Secondary | ICD-10-CM | POA: Diagnosis not present

## 2018-01-29 DIAGNOSIS — M419 Scoliosis, unspecified: Secondary | ICD-10-CM | POA: Diagnosis not present

## 2018-01-29 DIAGNOSIS — M48062 Spinal stenosis, lumbar region with neurogenic claudication: Secondary | ICD-10-CM | POA: Diagnosis not present

## 2018-01-30 DIAGNOSIS — H35363 Drusen (degenerative) of macula, bilateral: Secondary | ICD-10-CM | POA: Diagnosis not present

## 2018-01-30 DIAGNOSIS — H353211 Exudative age-related macular degeneration, right eye, with active choroidal neovascularization: Secondary | ICD-10-CM | POA: Diagnosis not present

## 2018-01-30 DIAGNOSIS — Z961 Presence of intraocular lens: Secondary | ICD-10-CM | POA: Diagnosis not present

## 2018-01-30 DIAGNOSIS — H40003 Preglaucoma, unspecified, bilateral: Secondary | ICD-10-CM | POA: Diagnosis not present

## 2018-01-30 DIAGNOSIS — H353221 Exudative age-related macular degeneration, left eye, with active choroidal neovascularization: Secondary | ICD-10-CM | POA: Diagnosis not present

## 2018-01-30 DIAGNOSIS — H21233 Degeneration of iris (pigmentary), bilateral: Secondary | ICD-10-CM | POA: Diagnosis not present

## 2018-02-07 DIAGNOSIS — H353221 Exudative age-related macular degeneration, left eye, with active choroidal neovascularization: Secondary | ICD-10-CM | POA: Diagnosis not present

## 2018-02-08 DIAGNOSIS — H353211 Exudative age-related macular degeneration, right eye, with active choroidal neovascularization: Secondary | ICD-10-CM | POA: Diagnosis not present

## 2018-02-09 DIAGNOSIS — M47816 Spondylosis without myelopathy or radiculopathy, lumbar region: Secondary | ICD-10-CM | POA: Diagnosis not present

## 2018-02-09 DIAGNOSIS — E119 Type 2 diabetes mellitus without complications: Secondary | ICD-10-CM | POA: Diagnosis not present

## 2018-02-09 DIAGNOSIS — Z7984 Long term (current) use of oral hypoglycemic drugs: Secondary | ICD-10-CM | POA: Diagnosis not present

## 2018-02-09 DIAGNOSIS — I10 Essential (primary) hypertension: Secondary | ICD-10-CM | POA: Diagnosis not present

## 2018-02-09 DIAGNOSIS — Z4789 Encounter for other orthopedic aftercare: Secondary | ICD-10-CM | POA: Diagnosis not present

## 2018-02-11 DIAGNOSIS — I10 Essential (primary) hypertension: Secondary | ICD-10-CM | POA: Diagnosis not present

## 2018-02-11 DIAGNOSIS — E119 Type 2 diabetes mellitus without complications: Secondary | ICD-10-CM | POA: Diagnosis not present

## 2018-02-11 DIAGNOSIS — Z7984 Long term (current) use of oral hypoglycemic drugs: Secondary | ICD-10-CM | POA: Diagnosis not present

## 2018-02-11 DIAGNOSIS — Z4789 Encounter for other orthopedic aftercare: Secondary | ICD-10-CM | POA: Diagnosis not present

## 2018-02-11 DIAGNOSIS — M47816 Spondylosis without myelopathy or radiculopathy, lumbar region: Secondary | ICD-10-CM | POA: Diagnosis not present

## 2018-02-14 DIAGNOSIS — M545 Low back pain: Secondary | ICD-10-CM | POA: Diagnosis not present

## 2018-02-14 DIAGNOSIS — M47816 Spondylosis without myelopathy or radiculopathy, lumbar region: Secondary | ICD-10-CM | POA: Diagnosis not present

## 2018-02-14 DIAGNOSIS — I10 Essential (primary) hypertension: Secondary | ICD-10-CM | POA: Diagnosis not present

## 2018-02-14 DIAGNOSIS — E119 Type 2 diabetes mellitus without complications: Secondary | ICD-10-CM | POA: Diagnosis not present

## 2018-02-14 DIAGNOSIS — Z6830 Body mass index (BMI) 30.0-30.9, adult: Secondary | ICD-10-CM | POA: Diagnosis not present

## 2018-02-14 DIAGNOSIS — Z7984 Long term (current) use of oral hypoglycemic drugs: Secondary | ICD-10-CM | POA: Diagnosis not present

## 2018-02-14 DIAGNOSIS — Z4789 Encounter for other orthopedic aftercare: Secondary | ICD-10-CM | POA: Diagnosis not present

## 2018-02-18 DIAGNOSIS — E119 Type 2 diabetes mellitus without complications: Secondary | ICD-10-CM | POA: Diagnosis not present

## 2018-02-18 DIAGNOSIS — Z4789 Encounter for other orthopedic aftercare: Secondary | ICD-10-CM | POA: Diagnosis not present

## 2018-02-18 DIAGNOSIS — I10 Essential (primary) hypertension: Secondary | ICD-10-CM | POA: Diagnosis not present

## 2018-02-18 DIAGNOSIS — Z7984 Long term (current) use of oral hypoglycemic drugs: Secondary | ICD-10-CM | POA: Diagnosis not present

## 2018-02-18 DIAGNOSIS — M47816 Spondylosis without myelopathy or radiculopathy, lumbar region: Secondary | ICD-10-CM | POA: Diagnosis not present

## 2018-02-19 DIAGNOSIS — I10 Essential (primary) hypertension: Secondary | ICD-10-CM | POA: Diagnosis not present

## 2018-02-19 DIAGNOSIS — E119 Type 2 diabetes mellitus without complications: Secondary | ICD-10-CM | POA: Diagnosis not present

## 2018-02-19 DIAGNOSIS — Z4789 Encounter for other orthopedic aftercare: Secondary | ICD-10-CM | POA: Diagnosis not present

## 2018-02-19 DIAGNOSIS — Z7984 Long term (current) use of oral hypoglycemic drugs: Secondary | ICD-10-CM | POA: Diagnosis not present

## 2018-02-19 DIAGNOSIS — M47816 Spondylosis without myelopathy or radiculopathy, lumbar region: Secondary | ICD-10-CM | POA: Diagnosis not present

## 2018-02-21 DIAGNOSIS — R6883 Chills (without fever): Secondary | ICD-10-CM | POA: Diagnosis not present

## 2018-02-21 DIAGNOSIS — R509 Fever, unspecified: Secondary | ICD-10-CM | POA: Diagnosis not present

## 2018-02-26 DIAGNOSIS — Z4789 Encounter for other orthopedic aftercare: Secondary | ICD-10-CM | POA: Diagnosis not present

## 2018-02-26 DIAGNOSIS — E119 Type 2 diabetes mellitus without complications: Secondary | ICD-10-CM | POA: Diagnosis not present

## 2018-02-26 DIAGNOSIS — M1711 Unilateral primary osteoarthritis, right knee: Secondary | ICD-10-CM | POA: Diagnosis not present

## 2018-02-26 DIAGNOSIS — M47816 Spondylosis without myelopathy or radiculopathy, lumbar region: Secondary | ICD-10-CM | POA: Diagnosis not present

## 2018-02-26 DIAGNOSIS — I10 Essential (primary) hypertension: Secondary | ICD-10-CM | POA: Diagnosis not present

## 2018-02-26 DIAGNOSIS — Z7984 Long term (current) use of oral hypoglycemic drugs: Secondary | ICD-10-CM | POA: Diagnosis not present

## 2018-02-28 DIAGNOSIS — Z4789 Encounter for other orthopedic aftercare: Secondary | ICD-10-CM | POA: Diagnosis not present

## 2018-02-28 DIAGNOSIS — I10 Essential (primary) hypertension: Secondary | ICD-10-CM | POA: Diagnosis not present

## 2018-02-28 DIAGNOSIS — M47816 Spondylosis without myelopathy or radiculopathy, lumbar region: Secondary | ICD-10-CM | POA: Diagnosis not present

## 2018-02-28 DIAGNOSIS — Z7984 Long term (current) use of oral hypoglycemic drugs: Secondary | ICD-10-CM | POA: Diagnosis not present

## 2018-02-28 DIAGNOSIS — E119 Type 2 diabetes mellitus without complications: Secondary | ICD-10-CM | POA: Diagnosis not present

## 2018-03-06 DIAGNOSIS — Z7984 Long term (current) use of oral hypoglycemic drugs: Secondary | ICD-10-CM | POA: Diagnosis not present

## 2018-03-06 DIAGNOSIS — I10 Essential (primary) hypertension: Secondary | ICD-10-CM | POA: Diagnosis not present

## 2018-03-06 DIAGNOSIS — E119 Type 2 diabetes mellitus without complications: Secondary | ICD-10-CM | POA: Diagnosis not present

## 2018-03-06 DIAGNOSIS — M47816 Spondylosis without myelopathy or radiculopathy, lumbar region: Secondary | ICD-10-CM | POA: Diagnosis not present

## 2018-03-06 DIAGNOSIS — Z4789 Encounter for other orthopedic aftercare: Secondary | ICD-10-CM | POA: Diagnosis not present

## 2018-03-07 DIAGNOSIS — Z7984 Long term (current) use of oral hypoglycemic drugs: Secondary | ICD-10-CM | POA: Diagnosis not present

## 2018-03-07 DIAGNOSIS — I10 Essential (primary) hypertension: Secondary | ICD-10-CM | POA: Diagnosis not present

## 2018-03-07 DIAGNOSIS — E119 Type 2 diabetes mellitus without complications: Secondary | ICD-10-CM | POA: Diagnosis not present

## 2018-03-07 DIAGNOSIS — M47816 Spondylosis without myelopathy or radiculopathy, lumbar region: Secondary | ICD-10-CM | POA: Diagnosis not present

## 2018-03-07 DIAGNOSIS — Z4789 Encounter for other orthopedic aftercare: Secondary | ICD-10-CM | POA: Diagnosis not present

## 2018-03-08 DIAGNOSIS — E119 Type 2 diabetes mellitus without complications: Secondary | ICD-10-CM | POA: Diagnosis not present

## 2018-03-08 DIAGNOSIS — I10 Essential (primary) hypertension: Secondary | ICD-10-CM | POA: Diagnosis not present

## 2018-03-08 DIAGNOSIS — M47816 Spondylosis without myelopathy or radiculopathy, lumbar region: Secondary | ICD-10-CM | POA: Diagnosis not present

## 2018-03-08 DIAGNOSIS — Z7984 Long term (current) use of oral hypoglycemic drugs: Secondary | ICD-10-CM | POA: Diagnosis not present

## 2018-03-08 DIAGNOSIS — Z4789 Encounter for other orthopedic aftercare: Secondary | ICD-10-CM | POA: Diagnosis not present

## 2018-03-12 DIAGNOSIS — H353221 Exudative age-related macular degeneration, left eye, with active choroidal neovascularization: Secondary | ICD-10-CM | POA: Diagnosis not present

## 2018-03-20 DIAGNOSIS — H353211 Exudative age-related macular degeneration, right eye, with active choroidal neovascularization: Secondary | ICD-10-CM | POA: Diagnosis not present

## 2018-04-10 DIAGNOSIS — K210 Gastro-esophageal reflux disease with esophagitis: Secondary | ICD-10-CM | POA: Diagnosis not present

## 2018-04-10 DIAGNOSIS — E039 Hypothyroidism, unspecified: Secondary | ICD-10-CM | POA: Diagnosis not present

## 2018-04-10 DIAGNOSIS — E782 Mixed hyperlipidemia: Secondary | ICD-10-CM | POA: Diagnosis not present

## 2018-04-10 DIAGNOSIS — D649 Anemia, unspecified: Secondary | ICD-10-CM | POA: Diagnosis not present

## 2018-04-10 DIAGNOSIS — E875 Hyperkalemia: Secondary | ICD-10-CM | POA: Diagnosis not present

## 2018-04-10 DIAGNOSIS — E1122 Type 2 diabetes mellitus with diabetic chronic kidney disease: Secondary | ICD-10-CM | POA: Diagnosis not present

## 2018-04-10 DIAGNOSIS — D519 Vitamin B12 deficiency anemia, unspecified: Secondary | ICD-10-CM | POA: Diagnosis not present

## 2018-04-10 DIAGNOSIS — I10 Essential (primary) hypertension: Secondary | ICD-10-CM | POA: Diagnosis not present

## 2018-04-10 DIAGNOSIS — D529 Folate deficiency anemia, unspecified: Secondary | ICD-10-CM | POA: Diagnosis not present

## 2018-04-10 DIAGNOSIS — J449 Chronic obstructive pulmonary disease, unspecified: Secondary | ICD-10-CM | POA: Diagnosis not present

## 2018-04-10 DIAGNOSIS — N189 Chronic kidney disease, unspecified: Secondary | ICD-10-CM | POA: Diagnosis not present

## 2018-04-11 DIAGNOSIS — Z09 Encounter for follow-up examination after completed treatment for conditions other than malignant neoplasm: Secondary | ICD-10-CM | POA: Diagnosis not present

## 2018-04-11 DIAGNOSIS — J9811 Atelectasis: Secondary | ICD-10-CM | POA: Diagnosis not present

## 2018-04-11 DIAGNOSIS — I716 Thoracoabdominal aortic aneurysm, without rupture: Secondary | ICD-10-CM | POA: Diagnosis not present

## 2018-04-11 DIAGNOSIS — K449 Diaphragmatic hernia without obstruction or gangrene: Secondary | ICD-10-CM | POA: Diagnosis not present

## 2018-04-11 DIAGNOSIS — N200 Calculus of kidney: Secondary | ICD-10-CM | POA: Diagnosis not present

## 2018-04-11 DIAGNOSIS — I719 Aortic aneurysm of unspecified site, without rupture: Secondary | ICD-10-CM | POA: Diagnosis not present

## 2018-04-11 DIAGNOSIS — K5790 Diverticulosis of intestine, part unspecified, without perforation or abscess without bleeding: Secondary | ICD-10-CM | POA: Diagnosis not present

## 2018-04-11 DIAGNOSIS — T82330A Leakage of aortic (bifurcation) graft (replacement), initial encounter: Secondary | ICD-10-CM | POA: Diagnosis not present

## 2018-04-11 DIAGNOSIS — I10 Essential (primary) hypertension: Secondary | ICD-10-CM | POA: Diagnosis not present

## 2018-04-11 DIAGNOSIS — Z006 Encounter for examination for normal comparison and control in clinical research program: Secondary | ICD-10-CM | POA: Diagnosis not present

## 2018-04-11 DIAGNOSIS — Z95828 Presence of other vascular implants and grafts: Secondary | ICD-10-CM | POA: Diagnosis not present

## 2018-04-11 DIAGNOSIS — F17210 Nicotine dependence, cigarettes, uncomplicated: Secondary | ICD-10-CM | POA: Diagnosis not present

## 2018-04-11 DIAGNOSIS — Z6833 Body mass index (BMI) 33.0-33.9, adult: Secondary | ICD-10-CM | POA: Diagnosis not present

## 2018-04-16 DIAGNOSIS — I10 Essential (primary) hypertension: Secondary | ICD-10-CM | POA: Diagnosis not present

## 2018-04-16 DIAGNOSIS — E039 Hypothyroidism, unspecified: Secondary | ICD-10-CM | POA: Diagnosis not present

## 2018-04-16 DIAGNOSIS — I714 Abdominal aortic aneurysm, without rupture: Secondary | ICD-10-CM | POA: Diagnosis not present

## 2018-04-16 DIAGNOSIS — D500 Iron deficiency anemia secondary to blood loss (chronic): Secondary | ICD-10-CM | POA: Diagnosis not present

## 2018-04-16 DIAGNOSIS — J9611 Chronic respiratory failure with hypoxia: Secondary | ICD-10-CM | POA: Diagnosis not present

## 2018-04-16 DIAGNOSIS — Z6831 Body mass index (BMI) 31.0-31.9, adult: Secondary | ICD-10-CM | POA: Diagnosis not present

## 2018-04-16 DIAGNOSIS — J449 Chronic obstructive pulmonary disease, unspecified: Secondary | ICD-10-CM | POA: Diagnosis not present

## 2018-04-16 DIAGNOSIS — E1122 Type 2 diabetes mellitus with diabetic chronic kidney disease: Secondary | ICD-10-CM | POA: Diagnosis not present

## 2018-04-19 DIAGNOSIS — H353211 Exudative age-related macular degeneration, right eye, with active choroidal neovascularization: Secondary | ICD-10-CM | POA: Diagnosis not present

## 2018-04-23 DIAGNOSIS — H353221 Exudative age-related macular degeneration, left eye, with active choroidal neovascularization: Secondary | ICD-10-CM | POA: Diagnosis not present

## 2018-05-23 DIAGNOSIS — M4316 Spondylolisthesis, lumbar region: Secondary | ICD-10-CM | POA: Diagnosis not present

## 2018-05-23 DIAGNOSIS — H353211 Exudative age-related macular degeneration, right eye, with active choroidal neovascularization: Secondary | ICD-10-CM | POA: Diagnosis not present

## 2018-05-23 DIAGNOSIS — M48062 Spinal stenosis, lumbar region with neurogenic claudication: Secondary | ICD-10-CM | POA: Diagnosis not present

## 2018-05-23 DIAGNOSIS — Z6833 Body mass index (BMI) 33.0-33.9, adult: Secondary | ICD-10-CM | POA: Diagnosis not present

## 2018-05-28 DIAGNOSIS — H353221 Exudative age-related macular degeneration, left eye, with active choroidal neovascularization: Secondary | ICD-10-CM | POA: Diagnosis not present

## 2018-06-21 DIAGNOSIS — H0102A Squamous blepharitis right eye, upper and lower eyelids: Secondary | ICD-10-CM | POA: Diagnosis not present

## 2018-06-21 DIAGNOSIS — H02135 Senile ectropion of left lower eyelid: Secondary | ICD-10-CM | POA: Diagnosis not present

## 2018-06-21 DIAGNOSIS — H02132 Senile ectropion of right lower eyelid: Secondary | ICD-10-CM | POA: Diagnosis not present

## 2018-06-21 DIAGNOSIS — H0102B Squamous blepharitis left eye, upper and lower eyelids: Secondary | ICD-10-CM | POA: Diagnosis not present

## 2018-06-24 DIAGNOSIS — H353211 Exudative age-related macular degeneration, right eye, with active choroidal neovascularization: Secondary | ICD-10-CM | POA: Diagnosis not present

## 2018-06-24 DIAGNOSIS — H0100A Unspecified blepharitis right eye, upper and lower eyelids: Secondary | ICD-10-CM | POA: Diagnosis not present

## 2018-06-24 DIAGNOSIS — H11433 Conjunctival hyperemia, bilateral: Secondary | ICD-10-CM | POA: Diagnosis not present

## 2018-06-24 DIAGNOSIS — H0100B Unspecified blepharitis left eye, upper and lower eyelids: Secondary | ICD-10-CM | POA: Diagnosis not present

## 2018-06-24 DIAGNOSIS — M1711 Unilateral primary osteoarthritis, right knee: Secondary | ICD-10-CM | POA: Diagnosis not present

## 2018-06-24 DIAGNOSIS — H353221 Exudative age-related macular degeneration, left eye, with active choroidal neovascularization: Secondary | ICD-10-CM | POA: Diagnosis not present

## 2018-06-24 DIAGNOSIS — H1045 Other chronic allergic conjunctivitis: Secondary | ICD-10-CM | POA: Diagnosis not present

## 2018-06-24 DIAGNOSIS — H35363 Drusen (degenerative) of macula, bilateral: Secondary | ICD-10-CM | POA: Diagnosis not present

## 2018-06-27 DIAGNOSIS — M545 Low back pain: Secondary | ICD-10-CM | POA: Diagnosis not present

## 2018-06-27 DIAGNOSIS — M4316 Spondylolisthesis, lumbar region: Secondary | ICD-10-CM | POA: Diagnosis not present

## 2018-06-27 DIAGNOSIS — M5416 Radiculopathy, lumbar region: Secondary | ICD-10-CM | POA: Diagnosis not present

## 2018-07-01 DIAGNOSIS — H0102A Squamous blepharitis right eye, upper and lower eyelids: Secondary | ICD-10-CM | POA: Diagnosis not present

## 2018-07-01 DIAGNOSIS — H02135 Senile ectropion of left lower eyelid: Secondary | ICD-10-CM | POA: Diagnosis not present

## 2018-07-01 DIAGNOSIS — H0102B Squamous blepharitis left eye, upper and lower eyelids: Secondary | ICD-10-CM | POA: Diagnosis not present

## 2018-07-01 DIAGNOSIS — H02132 Senile ectropion of right lower eyelid: Secondary | ICD-10-CM | POA: Diagnosis not present

## 2018-07-03 DIAGNOSIS — H353211 Exudative age-related macular degeneration, right eye, with active choroidal neovascularization: Secondary | ICD-10-CM | POA: Diagnosis not present

## 2018-07-10 DIAGNOSIS — H353221 Exudative age-related macular degeneration, left eye, with active choroidal neovascularization: Secondary | ICD-10-CM | POA: Diagnosis not present

## 2018-07-16 NOTE — Progress Notes (Signed)
need orders for 6-19 surgery pre op is 6-16

## 2018-07-17 DIAGNOSIS — E1122 Type 2 diabetes mellitus with diabetic chronic kidney disease: Secondary | ICD-10-CM | POA: Diagnosis not present

## 2018-07-17 DIAGNOSIS — N183 Chronic kidney disease, stage 3 (moderate): Secondary | ICD-10-CM | POA: Diagnosis not present

## 2018-07-17 DIAGNOSIS — E782 Mixed hyperlipidemia: Secondary | ICD-10-CM | POA: Diagnosis not present

## 2018-07-17 DIAGNOSIS — K210 Gastro-esophageal reflux disease with esophagitis: Secondary | ICD-10-CM | POA: Diagnosis not present

## 2018-07-17 DIAGNOSIS — E039 Hypothyroidism, unspecified: Secondary | ICD-10-CM | POA: Diagnosis not present

## 2018-07-17 DIAGNOSIS — E875 Hyperkalemia: Secondary | ICD-10-CM | POA: Diagnosis not present

## 2018-07-23 DIAGNOSIS — E1122 Type 2 diabetes mellitus with diabetic chronic kidney disease: Secondary | ICD-10-CM | POA: Diagnosis not present

## 2018-07-23 DIAGNOSIS — I10 Essential (primary) hypertension: Secondary | ICD-10-CM | POA: Diagnosis not present

## 2018-07-23 DIAGNOSIS — I714 Abdominal aortic aneurysm, without rupture: Secondary | ICD-10-CM | POA: Diagnosis not present

## 2018-07-23 DIAGNOSIS — D500 Iron deficiency anemia secondary to blood loss (chronic): Secondary | ICD-10-CM | POA: Diagnosis not present

## 2018-07-23 DIAGNOSIS — J9611 Chronic respiratory failure with hypoxia: Secondary | ICD-10-CM | POA: Diagnosis not present

## 2018-07-23 DIAGNOSIS — E039 Hypothyroidism, unspecified: Secondary | ICD-10-CM | POA: Diagnosis not present

## 2018-07-23 DIAGNOSIS — E782 Mixed hyperlipidemia: Secondary | ICD-10-CM | POA: Diagnosis not present

## 2018-07-23 DIAGNOSIS — J449 Chronic obstructive pulmonary disease, unspecified: Secondary | ICD-10-CM | POA: Diagnosis not present

## 2018-07-24 ENCOUNTER — Encounter: Payer: Self-pay | Admitting: Specialist

## 2018-07-24 NOTE — H&P (Signed)
TOTAL KNEE ADMISSION H&P  Patient is being admitted for right total knee arthroplasty.  Subjective:  Chief Complaint:right knee pain.  HPI: Catherine Rich, 85 y.o. female, has a history of pain and functional disability in the right knee due to arthritis and has failed non-surgical conservative treatments for greater than 12 weeks to includeNSAID's and/or analgesics, corticosteriod injections and viscosupplementation injections.  Onset of symptoms was gradual, starting >10 years ago with gradually worsening course since that time. The patient noted no past surgery on the right knee(s).  Patient currently rates pain in the right knee(s) at 10 out of 10 with activity. Patient has night pain, worsening of pain with activity and weight bearing, pain that interferes with activities of daily living, pain with passive range of motion, crepitus and joint swelling.  Patient has evidence of subchondral cysts, subchondral sclerosis, periarticular osteophytes, joint subluxation and joint space narrowing by imaging studies.There is no active infection    Past Medical History:  Diagnosis Date  . Anemia   . Arthritis   . Chronic kidney disease   . Diabetes mellitus without complication ([place])   . GERD (gastroesophageal reflux disease)   . Hypertension   . Hypothyroidism   . Stroke [facility])     Past Surgical History:  Procedure Laterality Date  . ABDOMINAL HYSTERECTOMY    . BACK SURGERY    . EYE SURGERY    . VASCULAR SURGERY      No current facility-administered medications for this encounter.    No current outpatient medications on file.   Allergies  Allergen Reactions  . Codeine Nausea And Vomiting  . Penicillins Rash  . Sulfa Antibiotics Nausea And Vomiting    Social History   Tobacco Use  . Smoking status: Never Smoker  Substance Use Topics  . Alcohol use: Never    Frequency: Never    History reviewed. No pertinent family history.   Review of Systems  Constitutional: Negative.    HENT: Positive for congestion.   Eyes: Negative.   Respiratory: Negative.   Cardiovascular: Negative.   Gastrointestinal: Negative.   Genitourinary: Negative.   Musculoskeletal: Positive for joint pain.  Skin: Negative.   Neurological: Negative.   Endo/Heme/Allergies: Negative.   Psychiatric/Behavioral: Negative.     Objective:  Physical Exam  Constitutional: She is oriented to person, place, and time. She appears well-developed and well-nourished.  HENT:  Head: Normocephalic and atraumatic.  Mouth/Throat: Oropharynx is clear and moist.  Eyes: Pupils are equal, round, and reactive to light. Conjunctivae and EOM are normal.  Neck: Normal range of motion. Neck supple. No JVD present. No tracheal deviation present. No thyromegaly present.  Cardiovascular: Normal rate, regular rhythm and normal heart sounds.  Respiratory: Effort normal and breath sounds normal. No stridor.  GI: Soft. Bowel sounds are normal.  Musculoskeletal:     Right knee: She exhibits decreased range of motion, swelling, effusion, deformity, abnormal alignment and bony tenderness. Tenderness found. Medial joint line and lateral joint line tenderness noted.  Lymphadenopathy:    She has no cervical adenopathy.  Neurological: She is alert and oriented to person, place, and time.  Skin: Skin is warm and dry.  Psychiatric: She has a normal mood and affect. Her behavior is normal. Judgment and thought content normal.    Vital signs in last 24 hours: BP: (118/62  Labs:   BMI 30  Imaging Review Plain radiographs demonstrate severe degenerative joint disease of the right knee(s). The overall alignment ismild varus. The bone quality appears to  be fair for age and reported activity level.      Assessment/Plan:  End stage arthritis, right knee   The patient history, physical examination, clinical judgment of the provider and imaging studies are consistent with end stage degenerative joint disease of the  right knee(s) and total knee arthroplasty is deemed medically necessary. The treatment options including medical management, injection therapy arthroscopy and arthroplasty were discussed at length. The risks and benefits of total knee arthroplasty were presented and reviewed. The risks due to aseptic loosening, infection, stiffness, patella tracking problems, thromboembolic complications and other imponderables were discussed. The patient acknowledged the explanation, agreed to proceed with the plan and consent was signed. Patient is being admitted for inpatient treatment for surgery, pain control, PT, OT, prophylactic antibiotics, VTE prophylaxis, progressive ambulation and ADL's and discharge planning. The patient is planning to be discharged home with home health services    Anticipated LOS equal to or greater than 2 midnights due to - Age 24 and older with one or more of the following:  - Obesity  - Expected need for hospital services (PT, OT, Nursing) required for safe  discharge  - Anticipated need for postoperative skilled nursing care or inpatient rehab  - Active co-morbidities: Diabetes, Stroke and Anemia

## 2018-07-26 DIAGNOSIS — H02132 Senile ectropion of right lower eyelid: Secondary | ICD-10-CM | POA: Diagnosis not present

## 2018-07-26 DIAGNOSIS — H02135 Senile ectropion of left lower eyelid: Secondary | ICD-10-CM | POA: Diagnosis not present

## 2018-07-26 DIAGNOSIS — H0102A Squamous blepharitis right eye, upper and lower eyelids: Secondary | ICD-10-CM | POA: Diagnosis not present

## 2018-07-26 DIAGNOSIS — H0102B Squamous blepharitis left eye, upper and lower eyelids: Secondary | ICD-10-CM | POA: Diagnosis not present

## 2018-07-29 NOTE — Patient Instructions (Addendum)
Catherine Rich    Your procedure is scheduled on: 08-02-2018   Report to [facility] Main  Entrance.      Report to SHORT STAY at 5:30 AM     YOU NEED TO HAVE A COVID 19 TEST ON_6/16______ @__12 :15_____, THIS TEST MUST BE DONE BEFORE SURGERY, COME TO [place] ENTRANCE.  ONCE YOUR COVID TEST IS COMPLETED, PLEASE BEGIN THE QUARANTINE INSTRUCTIONS AS OUTLINED IN YOUR HANDOUT.    Call this number if you have problems the morning of surgery [phone]     Remember:  [place], NO CHEWING GUM CANDY OR MINTS.   NO SOLID FOOD AFTER MIDNIGHT THE NIGHT PRIOR TO SURGERY . NOTHING BY MOUTH EXCEPT CLEAR LIQUIDS UNTIL 4:30 AM.  . PLEASE FINISH ENSURE DRINK PER SURGEON ORDER  WHICH NEEDS TO BE COMPLETED AT 4:30 AM.    CLEAR LIQUID DIET   Foods Allowed                                                                     Foods Excluded  Coffee and tea, regular and decaf                             liquids that you cannot  Plain Jell-O in any flavor                                             see through such as: Fruit ices (not with fruit pulp)                                     milk, soups, orange juice  Iced Popsicles                                    All solid food Carbonated beverages, regular and diet                                    Cranberry, grape and apple juices Sports drinks like Gatorade Lightly seasoned clear broth or consume(fat free) Sugar, honey syrup     Take these medicines the morning of surgery with A SIP OF WATER              : LORATADINE (CLARITIN), ATORVASTATIN (LIPITOR)< AMLODIPINE (NORVASC), EYE DROP, LEVOTHYROXINE (SYNTHROID), PANTAPRAZOLE (PROTONIX)   DO NOT TAKE ANY DIABETIC MEDICATIONS DAY OF YOUR SURGERY     How to Manage Your Diabetes Before and After Surgery  Why is it important to control my blood sugar before and after surgery? . Improving  blood sugar levels before and after surgery helps healing and can limit problems. . A way of improving blood sugar control is eating a healthy diet  by: o  Eating less sugar and carbohydrates o  Increasing activity/exercise o  Talking with your doctor about reaching your blood sugar goals . High blood sugars (greater than 180 mg/dL) can raise your risk of infections and slow your recovery, so you will need to focus on controlling your diabetes during the weeks before surgery. . Make sure that the doctor who takes care of your diabetes knows about your planned surgery including the date and location.  How do I manage my blood sugar before surgery? . Check your blood sugar at least 4 times a day, starting 2 days before surgery, to make sure that the level is not too high or low. o Check your blood sugar the morning of your surgery when you wake up and every 2 hours until you get to the Short Stay unit. . If your blood sugar is less than 70 mg/dL, you will need to treat for low blood sugar: o Do not take insulin. o Treat a low blood sugar (less than 70 mg/dL) with  cup of clear juice (cranberry or apple), 4 glucose tablets, OR glucose gel. o Recheck blood sugar in 15 minutes after treatment (to make sure it is greater than 70 mg/dL). If your blood sugar is not greater than 70 mg/dL on recheck, call [phone] for further instructions. . Report your blood sugar to the short stay nurse when you get to Short Stay.  . If you are admitted to the hospital after surgery: o Your blood sugar will be checked by the staff and you will probably be given insulin after surgery (instead of oral diabetes medicines) to make sure you have good blood sugar levels. o The goal for blood sugar control after surgery is 80-180 mg/dL.   WHAT DO I DO ABOUT MY DIABETES MEDICATION?  [name] Do not take oral diabetes medicines (pills) the morning of surgery.  . THE NIGHT BEFORE SURGERY DO NOT TAKE EVENING DOSE OF  GLIPIZIDE.                                You may not have any metal on your body including hair pins and              piercings             Do not wear jewelry, make-up, lotions, powders or perfumes, deodorant             Do not wear nail polish.  Do not shave  48 hours prior to surgery.               Do not bring valuables to the hospital. [place].  Contacts, dentures or bridgework may not be worn into surgery.      [place] - Preparing for Surgery  Before surgery, you can play an important role.   Because skin is not sterile, your skin needs to be as free of germs as possible.   You can reduce the number of germs on your skin by washing with CHG (chlorahexidine gluconate) soap before surgery.   CHG is an antiseptic cleaner which kills germs and bonds with the skin to continue killing germs even after washing. Please DO NOT use if you have an allergy to CHG or antibacterial soaps.   If your skin becomes reddened/irritated stop using  the CHG and inform your nurse when you arrive at Short Stay. Do not shave (including legs and underarms) for at least 48 hours prior to the first CHG shower.   You may shave your face/neck. Please follow these instructions carefully:  1.  Shower with CHG Soap the night before surgery and the  morning of Surgery.  2.  If you choose to wash your hair, wash your hair first as usual with your  normal  shampoo.  3.  After you shampoo, rinse your hair and body thoroughly to remove the  shampoo.                                        4.  Use CHG as you would any other liquid soap.  You can apply chg directly  to the skin and wash                       Gently with a scrungie or clean washcloth.  5.  Apply the CHG Soap to your body ONLY FROM THE NECK DOWN.   Do not use on face/ open                           Wound or open sores. Avoid contact with eyes, ears mouth and genitals (private parts).                        Wash face,  Genitals (private parts) with your normal soap.             6.  Wash thoroughly, paying special attention to the area where your surgery  will be performed.  7.  Thoroughly rinse your body with warm water from the neck down.  8.  DO NOT shower/wash with your normal soap after using and rinsing off  the CHG Soap.             9.  Pat yourself dry with a clean towel.            10.  Wear clean pajamas.            11.  Place clean sheets on your bed the night of your first shower and do not  sleep with pets . Day of Surgery : Do not apply any lotions/deodorants the morning of surgery.  Please wear clean clothes to the hospital/surgery center.   FAILURE TO FOLLOW THESE INSTRUCTIONS MAY RESULT IN THE CANCELLATION OF YOUR SURGERY PATIENT SIGNATURE_________________________________  NURSE SIGNATURE__________________________________  ________________________________________________________________________   Adam Phenix  An incentive spirometer is a tool that can help keep your lungs clear and active. This tool measures how well you are filling your lungs with each breath. Taking long deep breaths may help reverse or decrease the chance of developing breathing (pulmonary) problems (especially infection) following:  A long period of time when you are unable to move or be active. BEFORE THE PROCEDURE   If the spirometer includes an indicator to show your best effort, your nurse or respiratory therapist will set it to a desired goal.  If possible, sit up straight or lean slightly forward. Try not to slouch.  Hold the incentive spirometer in an upright position. INSTRUCTIONS FOR USE  1. Sit on the edge of your bed if possible, or sit up as far as you  can in bed or on a chair. 2. Hold the incentive spirometer in an upright position. 3. Breathe out normally. 4. Place the mouthpiece in your mouth and seal your lips tightly around it. 5. Breathe in slowly and as deeply  as possible, raising the piston or the ball toward the top of the column. 6. Hold your breath for 3-5 seconds or for as long as possible. Allow the piston or ball to fall to the bottom of the column. 7. Remove the mouthpiece from your mouth and breathe out normally. 8. Rest for a few seconds and repeat Steps 1 through 7 at least 10 times every 1-2 hours when you are awake. Take your time and take a few normal breaths between deep breaths. 9. The spirometer may include an indicator to show your best effort. Use the indicator as a goal to work toward during each repetition. 10. After each set of 10 deep breaths, practice coughing to be sure your lungs are clear. If you have an incision (the cut made at the time of surgery), support your incision when coughing by placing a pillow or rolled up towels firmly against it. Once you are able to get out of bed, walk around indoors and cough well. You may stop using the incentive spirometer when instructed by your caregiver.  RISKS AND COMPLICATIONS  Take your time so you do not get dizzy or light-headed.  If you are in pain, you may need to take or ask for pain medication before doing incentive spirometry. It is harder to take a deep breath if you are having pain. AFTER USE  Rest and breathe slowly and easily.  It can be helpful to keep track of a log of your progress. Your caregiver can provide you with a simple table to help with this. If you are using the spirometer at home, follow these instructions: [place] IF:   You are having difficultly using the spirometer.  You have trouble using the spirometer as often as instructed.  Your pain medication is not giving enough relief while using the spirometer.  You develop fever of 100.5 F (38.1 C) or higher. SEEK IMMEDIATE MEDICAL CARE IF:   You cough up bloody sputum that had not been present before.  You develop fever of 102 F (38.9 C) or greater.  You develop worsening pain at or  near the incision site. MAKE SURE YOU:   Understand these instructions.  Will watch your condition.  Will get help right away if you are not doing well or get worse. Document Released: 06/12/2006 Document Revised: 04/24/2011 Document Reviewed: 08/13/2006 ExitCare Patient Information 2014 ExitCare, [place].   ________________________________________________________________________  WHAT IS A BLOOD TRANSFUSION? Blood Transfusion Information  A transfusion is the replacement of blood or some of its parts. Blood is made up of multiple cells which provide different functions.  Red blood cells carry oxygen and are used for blood loss replacement.  White blood cells fight against infection.  Platelets control bleeding.  Plasma helps clot blood.  Other blood products are available for specialized needs, such as hemophilia or other clotting disorders. BEFORE THE TRANSFUSION  Who gives blood for transfusions?   Healthy volunteers who are fully evaluated to make sure their blood is safe. This is blood bank blood. Transfusion therapy is the safest it has ever been in the practice of medicine. Before blood is taken from a donor, a complete history is taken to make sure that person has no history of diseases nor  engages in risky social behavior (examples are intravenous drug use or sexual activity with multiple partners). The donor's travel history is screened to minimize risk of transmitting infections, such as malaria. The donated blood is tested for signs of infectious diseases, such as HIV and hepatitis. The blood is then tested to be sure it is compatible with you in order to minimize the chance of a transfusion reaction. If you or a relative donates blood, this is often done in anticipation of surgery and is not appropriate for emergency situations. It takes many days to process the donated blood. RISKS AND COMPLICATIONS Although transfusion therapy is very safe and saves many lives, the main  dangers of transfusion include:   Getting an infectious disease.  Developing a transfusion reaction. This is an allergic reaction to something in the blood you were given. Every precaution is taken to prevent this. The decision to have a blood transfusion has been considered carefully by your caregiver before blood is given. Blood is not given unless the benefits outweigh the risks. AFTER THE TRANSFUSION  Right after receiving a blood transfusion, you will usually feel much better and more energetic. This is especially true if your red blood cells have gotten low (anemic). The transfusion raises the level of the red blood cells which carry oxygen, and this usually causes an energy increase.  The nurse administering the transfusion will monitor you carefully for complications. HOME CARE INSTRUCTIONS  No special instructions are needed after a transfusion. You may find your energy is better. Speak with your caregiver about any limitations on activity for underlying diseases you may have. SEEK MEDICAL CARE IF:   Your condition is not improving after your transfusion.  You develop redness or irritation at the intravenous (IV) site. SEEK IMMEDIATE MEDICAL CARE IF:  Any of the following symptoms occur over the next 12 hours:  Shaking chills.  You have a temperature by mouth above 102 F (38.9 C), not controlled by medicine.  Chest, back, or muscle pain.  People around you feel you are not acting correctly or are confused.  Shortness of breath or difficulty breathing.  Dizziness and fainting.  You get a rash or develop hives.  You have a decrease in urine output.  Your urine turns a dark color or changes to pink, red, or brown. Any of the following symptoms occur over the next 10 days:  You have a temperature by mouth above 102 F (38.9 C), not controlled by medicine.  Shortness of breath.  Weakness after normal activity.  The white part of the eye turns yellow  (jaundice).  You have a decrease in the amount of urine or are urinating less often.  Your urine turns a dark color or changes to pink, red, or brown. Document Released: 01/28/2000 Document Revised: 04/24/2011 Document Reviewed: 09/16/2007 [facility] Patient Information 2014 [place], [place].  _______________________________________________________________________

## 2018-07-30 ENCOUNTER — Encounter (HOSPITAL_COMMUNITY)
Admission: RE | Admit: 2018-07-30 | Discharge: 2018-07-30 | Disposition: A | Discharge status: Home / Self Care | Payer: Medicare Other | Source: Ambulatory Visit | Attending: Specialist | Admitting: Specialist

## 2018-07-30 ENCOUNTER — Other Ambulatory Visit (HOSPITAL_COMMUNITY)
Admission: RE | Admit: 2018-07-30 | Discharge: 2018-07-30 | Disposition: A | Discharge status: Home / Self Care | Payer: Medicare Other | Source: Ambulatory Visit | Attending: Specialist | Admitting: Specialist

## 2018-07-30 ENCOUNTER — Other Ambulatory Visit: Payer: Self-pay

## 2018-07-30 ENCOUNTER — Encounter (HOSPITAL_COMMUNITY): Payer: Self-pay

## 2018-07-30 DIAGNOSIS — E1122 Type 2 diabetes mellitus with diabetic chronic kidney disease: Secondary | ICD-10-CM | POA: Diagnosis not present

## 2018-07-30 DIAGNOSIS — Z1159 Encounter for screening for other viral diseases: Secondary | ICD-10-CM | POA: Insufficient documentation

## 2018-07-30 DIAGNOSIS — H3530 Unspecified macular degeneration: Secondary | ICD-10-CM | POA: Insufficient documentation

## 2018-07-30 DIAGNOSIS — E039 Hypothyroidism, unspecified: Secondary | ICD-10-CM | POA: Diagnosis not present

## 2018-07-30 DIAGNOSIS — E11649 Type 2 diabetes mellitus with hypoglycemia without coma: Secondary | ICD-10-CM | POA: Diagnosis not present

## 2018-07-30 DIAGNOSIS — I129 Hypertensive chronic kidney disease with stage 1 through stage 4 chronic kidney disease, or unspecified chronic kidney disease: Secondary | ICD-10-CM | POA: Insufficient documentation

## 2018-07-30 DIAGNOSIS — Z79890 Hormone replacement therapy (postmenopausal): Secondary | ICD-10-CM | POA: Insufficient documentation

## 2018-07-30 DIAGNOSIS — Z7982 Long term (current) use of aspirin: Secondary | ICD-10-CM | POA: Insufficient documentation

## 2018-07-30 DIAGNOSIS — M25761 Osteophyte, right knee: Secondary | ICD-10-CM | POA: Diagnosis not present

## 2018-07-30 DIAGNOSIS — Z01818 Encounter for other preprocedural examination: Secondary | ICD-10-CM | POA: Insufficient documentation

## 2018-07-30 DIAGNOSIS — Z8673 Personal history of transient ischemic attack (TIA), and cerebral infarction without residual deficits: Secondary | ICD-10-CM | POA: Insufficient documentation

## 2018-07-30 DIAGNOSIS — Z7984 Long term (current) use of oral hypoglycemic drugs: Secondary | ICD-10-CM | POA: Insufficient documentation

## 2018-07-30 DIAGNOSIS — Z79899 Other long term (current) drug therapy: Secondary | ICD-10-CM | POA: Insufficient documentation

## 2018-07-30 DIAGNOSIS — K219 Gastro-esophageal reflux disease without esophagitis: Secondary | ICD-10-CM | POA: Insufficient documentation

## 2018-07-30 DIAGNOSIS — M1711 Unilateral primary osteoarthritis, right knee: Secondary | ICD-10-CM | POA: Insufficient documentation

## 2018-07-30 DIAGNOSIS — Z885 Allergy status to narcotic agent status: Secondary | ICD-10-CM | POA: Diagnosis not present

## 2018-07-30 DIAGNOSIS — R339 Retention of urine, unspecified: Secondary | ICD-10-CM | POA: Diagnosis not present

## 2018-07-30 DIAGNOSIS — N189 Chronic kidney disease, unspecified: Secondary | ICD-10-CM | POA: Diagnosis not present

## 2018-07-30 HISTORY — DX: Other specified postprocedural states: Z98.890

## 2018-07-30 HISTORY — DX: Nausea with vomiting, unspecified: R11.2

## 2018-07-30 HISTORY — DX: Unspecified macular degeneration: H35.30

## 2018-07-30 HISTORY — DX: Other complications of anesthesia, initial encounter: T88.59XA

## 2018-07-30 LAB — SURGICAL PCR SCREEN
MRSA, PCR: NEGATIVE
Staphylococcus aureus: POSITIVE — AB

## 2018-07-30 LAB — BASIC METABOLIC PANEL
Anion gap: 13 (ref 5–15)
BUN: 48 mg/dL — ABNORMAL HIGH (ref 8–23)
CO2: 24 mmol/L (ref 22–32)
Calcium: 9.6 mg/dL (ref 8.9–10.3)
Chloride: 100 mmol/L (ref 98–111)
Creatinine, Ser: 1.75 mg/dL — ABNORMAL HIGH (ref 0.44–1.00)
GFR calc Af Amer: 30 mL/min — ABNORMAL LOW (ref >60)
GFR calc non Af Amer: 26 mL/min — ABNORMAL LOW (ref >60)
Glucose, Bld: 168 mg/dL — ABNORMAL HIGH (ref 70–99)
Potassium: 5.1 mmol/L (ref 3.5–5.1)
Sodium: 137 mmol/L (ref 135–145)

## 2018-07-30 LAB — CBC
HCT: 42.4 % (ref 36.0–46.0)
Hemoglobin: 13 g/dL (ref 12.0–15.0)
MCH: 27.7 pg (ref 26.0–34.0)
MCHC: 30.7 g/dL (ref 30.0–36.0)
MCV: 90.2 fL (ref 80.0–100.0)
Platelets: 229 10*3/uL (ref 150–400)
RBC: 4.7 MIL/uL (ref 3.87–5.11)
RDW: 14.7 % (ref 11.5–15.5)
WBC: 6.8 10*3/uL (ref 4.0–10.5)
nRBC: 0 % (ref 0.0–0.2)

## 2018-07-30 LAB — HEMOGLOBIN A1C
Hgb A1c MFr Bld: 7.2 % — ABNORMAL HIGH (ref 4.8–5.6)
Mean Plasma Glucose: 159.94 mg/dL

## 2018-07-30 LAB — GLUCOSE, CAPILLARY: Glucose-Capillary: 174 mg/dL — ABNORMAL HIGH (ref 70–99)

## 2018-07-30 LAB — ABO/RH: ABO/RH(D): A POS

## 2018-07-30 NOTE — Progress Notes (Signed)
Konrad Felix PA  Pt stopped her ASA 07/30/18 per Dr. Theda Sers office. PCP is Dr. Gar Ponto he prescribed it for her EKG and Labs in Epic

## 2018-07-31 LAB — NOVEL CORONAVIRUS, NAA (HOSP ORDER, SEND-OUT TO REF LAB; TAT 18-24 HRS): SARS-CoV-2, NAA: NOT DETECTED

## 2018-07-31 NOTE — Anesthesia Preprocedure Evaluation (Addendum)
Anesthesia Evaluation  Patient identified by MRN, date of birth, ID band Patient awake    Reviewed: Allergy & Precautions, NPO status , Patient's Chart, lab work & pertinent test results  Airway Mallampati: II  TM Distance: >3 FB Neck ROM: Full    Dental no notable dental hx.    Pulmonary neg pulmonary ROS,    Pulmonary exam normal breath sounds clear to auscultation       Cardiovascular hypertension, Normal cardiovascular exam Rhythm:Regular Rate:Normal     Neuro/Psych CVA negative psych ROS   GI/Hepatic negative GI ROS, Neg liver ROS,   Endo/Other  diabetesHypothyroidism   Renal/GU Renal disease  negative genitourinary   Musculoskeletal negative musculoskeletal ROS (+)   Abdominal   Peds negative pediatric ROS (+)  Hematology negative hematology ROS (+)   Anesthesia Other Findings   Reproductive/Obstetrics negative OB ROS                            Anesthesia Physical Anesthesia Plan  ASA: III  Anesthesia Plan: Spinal   Post-op Pain Management:  [place] for Post-op pain   Induction: Intravenous  PONV Risk Score and Plan: 2 and Ondansetron and Dexamethasone  Airway Management Planned: Simple Face Mask  Additional Equipment:   Intra-op Plan:   Post-operative Plan:   Informed Consent: I have reviewed the patients History and Physical, chart, labs and discussed the procedure including the risks, benefits and alternatives for the proposed anesthesia with the patient or authorized representative who has indicated his/her understanding and acceptance.     Dental advisory given  Plan Discussed with: CRNA and Surgeon  Anesthesia Plan Comments: (See PAT note 07/30/2018, Konrad Felix, PA-C)       Anesthesia Quick Evaluation

## 2018-07-31 NOTE — Progress Notes (Signed)
Anesthesia Chart Review   Case: [id number] Date/Time: 08/02/18 0715   Procedure: TOTAL KNEE ARTHROPLASTY (Right ) - with adductor canal   Anesthesia type: Spinal   Pre-op diagnosis: Right knee osteoarthritis   Location: [facility] ROOM 07 / [facility]   Surgeon: Sydnee Cabal, MD      DISCUSSION: 85 yo never smoker with h/o PONV, HTN, GERD, DM II, CVA 2012, hypothyroidism, CKD (one functioning kidney, baseline creatinine around 1.5), s/o thoracoabdominal aortic aneurysm repair, right knee OA scheduled for above procedure 08/02/2018 with Dr. Sydnee Cabal.   Pt last seen by vascular surgeon, Dr. Vinnie Level, 04/11/2018.  Per OV note, "Patient's CT scan reviewed which shows no change in size.  The stent graft appears to be intact with no separation components.  Patient's renal mesenteric duplex shows no issues with elevated velocities.  Four years after thoracoabdominal repair, doing well.  Return to clinic in 1 year follow up."  Anticipate pt can proceed with planned procedure barring acute status change.  VS: BP (!) 147/62 (BP Location: Right Arm)   Pulse 86   Temp 36.6 C (Oral)   Resp 18   Ht 5\' 3"  (1.6 m)   Wt 77.1 kg   SpO2 95%   BMI 30.11 kg/m   PROVIDERS: Caryl Bis, MD is PCP    LABS: Labs reviewed: Acceptable for surgery. (all labs ordered are listed, but only abnormal results are displayed)  Labs Reviewed  SURGICAL PCR SCREEN - Abnormal; Notable for the following components:      Result Value   Staphylococcus aureus POSITIVE (*)    All other components within normal limits  HEMOGLOBIN A1C - Abnormal; Notable for the following components:   Hgb A1c MFr Bld 7.2 (*)    All other components within normal limits  BASIC METABOLIC PANEL - Abnormal; Notable for the following components:   Glucose, Bld 168 (*)    BUN 48 (*)    Creatinine, Ser 1.75 (*)    GFR calc non Af Amer 26 (*)    GFR calc Af Amer 30 (*)    All other components within normal limits  GLUCOSE, CAPILLARY -  Abnormal; Notable for the following components:   Glucose-Capillary 174 (*)    All other components within normal limits  CBC  TYPE AND SCREEN     IMAGES:   EKG: 07/30/2018 Rate 78 bpm Normal sinus rhythm   CV:  Past Medical History:  Diagnosis Date  . Anemia   . Arthritis   . Chronic kidney disease    only one funtioning kidney  . Complication of anesthesia   . Diabetes mellitus without complication ([place])   . GERD (gastroesophageal reflux disease)   . Hypertension   . Hypothyroidism   . Macular degeneration, bilateral   . PONV (postoperative nausea and vomiting)   . Stroke [facility]) 01/19/2011    Past Surgical History:  Procedure Laterality Date  . ABDOMINAL HYSTERECTOMY  1970  . BACK SURGERY  01/02/2018   L4-L5   . EYE SURGERY    . VASCULAR SURGERY  11/2013   AAA    MEDICATIONS: . ALPRAZolam (XANAX) 0.5 MG tablet  . amLODipine (NORVASC) 10 MG tablet  . aspirin EC 81 MG tablet  . atorvastatin (LIPITOR) 40 MG tablet  . Cholecalciferol (VITAMIN D) 50 MCG (2000 UT) tablet  . cloNIDine (CATAPRES) 0.1 MG tablet  . gabapentin (NEURONTIN) 300 MG capsule  . glipiZIDE (GLUCOTROL XL) 10 MG 24 hr tablet  . irbesartan (AVAPRO)  300 MG tablet  . ketotifen (ZADITOR) 0.025 % ophthalmic solution  . levothyroxine (SYNTHROID) 112 MCG tablet  . loratadine (CLARITIN) 10 MG tablet  . Lutein-Zeaxanthin 25-5 MG CAPS  . Multiple Vitamins-Minerals (MULTIVITAMIN WITH MINERALS) tablet  . neomycin-polymyxin-dexameth (MAXITROL) 0.1 % OINT  . ofloxacin (OCUFLOX) 0.3 % ophthalmic solution  . pantoprazole (PROTONIX) 20 MG tablet  . prednisoLONE acetate (PRED FORTE) 1 % ophthalmic suspension  . traMADol (ULTRAM) 50 MG tablet  . vitamin B-12 (CYANOCOBALAMIN) 1000 MCG tablet  . vitamin C (ASCORBIC ACID) 500 MG tablet   No current facility-administered medications for this encounter.     [facility] Pre-Surgical Testing [phone] 07/31/18 3:48 PM

## 2018-08-01 MED ORDER — BUPIVACAINE LIPOSOME 1.3 % IJ SUSP
20.0000 mL | Freq: Once | INTRAMUSCULAR | Status: DC
  Filled 2018-08-01: qty 20

## 2018-08-02 ENCOUNTER — Inpatient Hospital Stay (HOSPITAL_COMMUNITY): Payer: Medicare Other | Admitting: Certified Registered"

## 2018-08-02 ENCOUNTER — Other Ambulatory Visit: Payer: Self-pay

## 2018-08-02 ENCOUNTER — Inpatient Hospital Stay (HOSPITAL_COMMUNITY): Payer: Medicare Other | Admitting: Physician Assistant

## 2018-08-02 ENCOUNTER — Inpatient Hospital Stay (HOSPITAL_COMMUNITY)
Admission: RE | Admit: 2018-08-02 | Discharge: 2018-08-06 | DRG: 470 | Disposition: A | Discharge status: Skilled Nursing Facility | Payer: Medicare Other | Attending: Specialist | Admitting: Specialist

## 2018-08-02 ENCOUNTER — Encounter (HOSPITAL_COMMUNITY)
Admission: RE | Disposition: A | Discharge status: Skilled Nursing Facility | Payer: Self-pay | Source: Home / Self Care | Attending: Specialist

## 2018-08-02 ENCOUNTER — Encounter (HOSPITAL_COMMUNITY): Payer: Self-pay | Admitting: Emergency Medicine

## 2018-08-02 DIAGNOSIS — Z1159 Encounter for screening for other viral diseases: Secondary | ICD-10-CM

## 2018-08-02 DIAGNOSIS — I959 Hypotension, unspecified: Secondary | ICD-10-CM | POA: Diagnosis not present

## 2018-08-02 DIAGNOSIS — R41841 Cognitive communication deficit: Secondary | ICD-10-CM | POA: Diagnosis not present

## 2018-08-02 DIAGNOSIS — Z7401 Bed confinement status: Secondary | ICD-10-CM | POA: Diagnosis not present

## 2018-08-02 DIAGNOSIS — M1711 Unilateral primary osteoarthritis, right knee: Secondary | ICD-10-CM | POA: Diagnosis present

## 2018-08-02 DIAGNOSIS — G8918 Other acute postprocedural pain: Secondary | ICD-10-CM | POA: Diagnosis not present

## 2018-08-02 DIAGNOSIS — R339 Retention of urine, unspecified: Secondary | ICD-10-CM | POA: Diagnosis not present

## 2018-08-02 DIAGNOSIS — Z96651 Presence of right artificial knee joint: Secondary | ICD-10-CM | POA: Diagnosis not present

## 2018-08-02 DIAGNOSIS — Z91011 Allergy to milk products: Secondary | ICD-10-CM

## 2018-08-02 DIAGNOSIS — M6281 Muscle weakness (generalized): Secondary | ICD-10-CM | POA: Diagnosis not present

## 2018-08-02 DIAGNOSIS — Z91012 Allergy to eggs: Secondary | ICD-10-CM | POA: Diagnosis not present

## 2018-08-02 DIAGNOSIS — K219 Gastro-esophageal reflux disease without esophagitis: Secondary | ICD-10-CM | POA: Diagnosis present

## 2018-08-02 DIAGNOSIS — Z880 Allergy status to penicillin: Secondary | ICD-10-CM | POA: Diagnosis not present

## 2018-08-02 DIAGNOSIS — E1122 Type 2 diabetes mellitus with diabetic chronic kidney disease: Secondary | ICD-10-CM | POA: Diagnosis present

## 2018-08-02 DIAGNOSIS — M25761 Osteophyte, right knee: Secondary | ICD-10-CM | POA: Diagnosis not present

## 2018-08-02 DIAGNOSIS — E11649 Type 2 diabetes mellitus with hypoglycemia without coma: Secondary | ICD-10-CM | POA: Diagnosis not present

## 2018-08-02 DIAGNOSIS — E039 Hypothyroidism, unspecified: Secondary | ICD-10-CM | POA: Diagnosis present

## 2018-08-02 DIAGNOSIS — Z8673 Personal history of transient ischemic attack (TIA), and cerebral infarction without residual deficits: Secondary | ICD-10-CM

## 2018-08-02 DIAGNOSIS — I10 Essential (primary) hypertension: Secondary | ICD-10-CM | POA: Diagnosis not present

## 2018-08-02 DIAGNOSIS — R2689 Other abnormalities of gait and mobility: Secondary | ICD-10-CM | POA: Diagnosis not present

## 2018-08-02 DIAGNOSIS — N189 Chronic kidney disease, unspecified: Secondary | ICD-10-CM | POA: Diagnosis present

## 2018-08-02 DIAGNOSIS — H3530 Unspecified macular degeneration: Secondary | ICD-10-CM | POA: Diagnosis present

## 2018-08-02 DIAGNOSIS — Z885 Allergy status to narcotic agent status: Secondary | ICD-10-CM | POA: Diagnosis not present

## 2018-08-02 DIAGNOSIS — F411 Generalized anxiety disorder: Secondary | ICD-10-CM | POA: Diagnosis not present

## 2018-08-02 DIAGNOSIS — R0902 Hypoxemia: Secondary | ICD-10-CM | POA: Diagnosis not present

## 2018-08-02 DIAGNOSIS — Z882 Allergy status to sulfonamides status: Secondary | ICD-10-CM | POA: Diagnosis not present

## 2018-08-02 DIAGNOSIS — M2550 Pain in unspecified joint: Secondary | ICD-10-CM | POA: Diagnosis not present

## 2018-08-02 DIAGNOSIS — Z471 Aftercare following joint replacement surgery: Secondary | ICD-10-CM | POA: Diagnosis not present

## 2018-08-02 DIAGNOSIS — M25561 Pain in right knee: Secondary | ICD-10-CM | POA: Diagnosis not present

## 2018-08-02 DIAGNOSIS — I129 Hypertensive chronic kidney disease with stage 1 through stage 4 chronic kidney disease, or unspecified chronic kidney disease: Secondary | ICD-10-CM | POA: Diagnosis not present

## 2018-08-02 DIAGNOSIS — E1140 Type 2 diabetes mellitus with diabetic neuropathy, unspecified: Secondary | ICD-10-CM | POA: Diagnosis not present

## 2018-08-02 HISTORY — DX: Unspecified osteoarthritis, unspecified site: M19.90

## 2018-08-02 HISTORY — DX: Hypothyroidism, unspecified: E03.9

## 2018-08-02 HISTORY — PX: TOTAL KNEE ARTHROPLASTY: SHX125

## 2018-08-02 HISTORY — DX: Essential (primary) hypertension: I10

## 2018-08-02 HISTORY — DX: Gastro-esophageal reflux disease without esophagitis: K21.9

## 2018-08-02 HISTORY — DX: Type 2 diabetes mellitus without complications: E11.9

## 2018-08-02 HISTORY — DX: Anemia, unspecified: D64.9

## 2018-08-02 HISTORY — DX: Chronic kidney disease, unspecified: N18.9

## 2018-08-02 LAB — CBC
HCT: 39.7 % (ref 36.0–46.0)
Hemoglobin: 12.1 g/dL (ref 12.0–15.0)
MCH: 28.1 pg (ref 26.0–34.0)
MCHC: 30.5 g/dL (ref 30.0–36.0)
MCV: 92.1 fL (ref 80.0–100.0)
Platelets: 203 10*3/uL (ref 150–400)
RBC: 4.31 MIL/uL (ref 3.87–5.11)
RDW: 14.7 % (ref 11.5–15.5)
WBC: 8.7 10*3/uL (ref 4.0–10.5)
nRBC: 0 % (ref 0.0–0.2)

## 2018-08-02 LAB — TYPE AND SCREEN
ABO/RH(D): A POS
Antibody Screen: NEGATIVE

## 2018-08-02 LAB — CREATININE, SERUM
Creatinine, Ser: 1.74 mg/dL — ABNORMAL HIGH (ref 0.44–1.00)
GFR calc Af Amer: 30 mL/min — ABNORMAL LOW (ref >60)
GFR calc non Af Amer: 26 mL/min — ABNORMAL LOW (ref >60)

## 2018-08-02 LAB — GLUCOSE, CAPILLARY
Glucose-Capillary: 177 mg/dL — ABNORMAL HIGH (ref 70–99)
Glucose-Capillary: 167 mg/dL — ABNORMAL HIGH (ref 70–99)
Glucose-Capillary: 181 mg/dL — ABNORMAL HIGH (ref 70–99)
Glucose-Capillary: 166 mg/dL — ABNORMAL HIGH (ref 70–99)

## 2018-08-02 SURGERY — ARTHROPLASTY, KNEE, TOTAL
Anesthesia: Spinal | Laterality: Right

## 2018-08-02 MED ORDER — PREDNISOLONE ACETATE 1 % OP SUSP
1.0000 [drp] | Freq: Four times a day (QID) | OPHTHALMIC | Status: DC
  Administered 2018-08-02 – 2018-08-06 (×13): 1 [drp] via OPHTHALMIC
  Filled 2018-08-02: qty 5

## 2018-08-02 MED ORDER — MENTHOL 3 MG MT LOZG: 1.0000 | LOZENGE | OROMUCOSAL | Status: DC | PRN

## 2018-08-02 MED ORDER — CHOLECALCIFEROL 10 MCG (400 UNIT) PO TABS: 2000.0000 [IU] | ORAL_TABLET | Freq: Every day | ORAL | Status: DC

## 2018-08-02 MED ORDER — LIDOCAINE 2% (20 MG/ML) 5 ML SYRINGE
INTRAMUSCULAR | Status: AC
  Filled 2018-08-02: qty 5

## 2018-08-02 MED ORDER — INSULIN ASPART 100 UNIT/ML ~~LOC~~ SOLN
0.0000 [IU] | Freq: Three times a day (TID) | SUBCUTANEOUS | Status: DC
  Administered 2018-08-02 – 2018-08-04 (×3): 3 [IU] via SUBCUTANEOUS
  Administered 2018-08-05: 5 [IU] via SUBCUTANEOUS
  Administered 2018-08-06: 2 [IU] via SUBCUTANEOUS

## 2018-08-02 MED ORDER — OXYCODONE HCL 5 MG PO TABS
10.0000 mg | ORAL_TABLET | ORAL | Status: DC | PRN
  Administered 2018-08-02 – 2018-08-03 (×2): 10 mg via ORAL
  Administered 2018-08-03 (×2): 15 mg via ORAL
  Filled 2018-08-02: qty 2
  Filled 2018-08-02: qty 3
  Filled 2018-08-02: qty 2
  Filled 2018-08-02: qty 3

## 2018-08-02 MED ORDER — GABAPENTIN 300 MG PO CAPS
300.0000 mg | ORAL_CAPSULE | Freq: Every day | ORAL | Status: DC
  Administered 2018-08-02: 300 mg via ORAL
  Administered 2018-08-03: 600 mg via ORAL
  Administered 2018-08-04 – 2018-08-05 (×2): 300 mg via ORAL
  Filled 2018-08-02 (×3): qty 1
  Filled 2018-08-02: qty 2

## 2018-08-02 MED ORDER — PANTOPRAZOLE SODIUM 20 MG PO TBEC
20.0000 mg | DELAYED_RELEASE_TABLET | Freq: Every day | ORAL | Status: DC
  Administered 2018-08-03 – 2018-08-06 (×4): 20 mg via ORAL
  Filled 2018-08-02 (×4): qty 1

## 2018-08-02 MED ORDER — ZOLPIDEM TARTRATE 5 MG PO TABS: 5.0000 mg | ORAL_TABLET | Freq: Every evening | ORAL | Status: DC | PRN

## 2018-08-02 MED ORDER — IRBESARTAN 300 MG PO TABS
300.0000 mg | ORAL_TABLET | Freq: Every day | ORAL | Status: DC
  Administered 2018-08-02 – 2018-08-05 (×4): 300 mg via ORAL
  Filled 2018-08-02: qty 1
  Filled 2018-08-02: qty 2
  Filled 2018-08-02 (×2): qty 1
  Filled 2018-08-02: qty 2
  Filled 2018-08-02: qty 1
  Filled 2018-08-02 (×2): qty 2

## 2018-08-02 MED ORDER — CLONIDINE HCL 0.1 MG PO TABS
0.1000 mg | ORAL_TABLET | Freq: Every evening | ORAL | Status: DC
  Administered 2018-08-02 – 2018-08-05 (×4): 0.1 mg via ORAL
  Filled 2018-08-02 (×4): qty 1

## 2018-08-02 MED ORDER — HYDROMORPHONE HCL 1 MG/ML IJ SOLN
INTRAMUSCULAR | Status: AC
  Filled 2018-08-02: qty 1

## 2018-08-02 MED ORDER — METOCLOPRAMIDE HCL 5 MG PO TABS: 5.0000 mg | ORAL_TABLET | Freq: Three times a day (TID) | ORAL | Status: DC | PRN

## 2018-08-02 MED ORDER — METOCLOPRAMIDE HCL 5 MG/ML IJ SOLN
5.0000 mg | Freq: Three times a day (TID) | INTRAMUSCULAR | Status: DC | PRN
  Administered 2018-08-05: 10 mg via INTRAVENOUS
  Filled 2018-08-02: qty 2

## 2018-08-02 MED ORDER — FLEET ENEMA 7-19 GM/118ML RE ENEM: 1.0000 | ENEMA | Freq: Once | RECTAL | Status: DC | PRN

## 2018-08-02 MED ORDER — VANCOMYCIN HCL 10 G IV SOLR
1000.0000 mg | Freq: Once | INTRAVENOUS | Status: DC
  Filled 2018-08-02: qty 1000

## 2018-08-02 MED ORDER — MIDAZOLAM HCL 2 MG/2ML IJ SOLN
INTRAMUSCULAR | Status: DC | PRN
  Administered 2018-08-02: 0.5 mg via INTRAVENOUS

## 2018-08-02 MED ORDER — EPHEDRINE SULFATE-NACL 50-0.9 MG/10ML-% IV SOSY
PREFILLED_SYRINGE | INTRAVENOUS | Status: DC | PRN
  Administered 2018-08-02 (×3): 5 mg via INTRAVENOUS

## 2018-08-02 MED ORDER — HYDROMORPHONE HCL 1 MG/ML IJ SOLN
0.5000 mg | INTRAMUSCULAR | Status: DC | PRN
  Administered 2018-08-02 (×2): 1 mg via INTRAVENOUS
  Filled 2018-08-02 (×2): qty 1

## 2018-08-02 MED ORDER — DOCUSATE SODIUM 100 MG PO CAPS
100.0000 mg | ORAL_CAPSULE | Freq: Two times a day (BID) | ORAL | Status: DC
  Administered 2018-08-02 – 2018-08-06 (×8): 100 mg via ORAL
  Filled 2018-08-02 (×8): qty 1

## 2018-08-02 MED ORDER — METHOCARBAMOL 1000 MG/10ML IJ SOLN
500.0000 mg | Freq: Four times a day (QID) | INTRAVENOUS | Status: DC | PRN
  Filled 2018-08-02: qty 5

## 2018-08-02 MED ORDER — VITAMIN B-12 1000 MCG PO TABS
1000.0000 ug | ORAL_TABLET | Freq: Every day | ORAL | Status: DC
  Administered 2018-08-02 – 2018-08-06 (×5): 1000 ug via ORAL
  Filled 2018-08-02 (×5): qty 1

## 2018-08-02 MED ORDER — ATORVASTATIN CALCIUM 40 MG PO TABS
40.0000 mg | ORAL_TABLET | Freq: Every day | ORAL | Status: DC
  Administered 2018-08-03 – 2018-08-06 (×4): 40 mg via ORAL
  Filled 2018-08-02 (×4): qty 1

## 2018-08-02 MED ORDER — AMLODIPINE BESYLATE 10 MG PO TABS
10.0000 mg | ORAL_TABLET | Freq: Every day | ORAL | Status: DC
  Administered 2018-08-03 – 2018-08-06 (×4): 10 mg via ORAL
  Filled 2018-08-02 (×4): qty 1

## 2018-08-02 MED ORDER — LEVOTHYROXINE SODIUM 112 MCG PO TABS
112.0000 ug | ORAL_TABLET | Freq: Every day | ORAL | Status: DC
  Administered 2018-08-03 – 2018-08-06 (×4): 112 ug via ORAL
  Filled 2018-08-02 (×4): qty 1

## 2018-08-02 MED ORDER — PROMETHAZINE HCL 25 MG/ML IJ SOLN: 6.2500 mg | INTRAMUSCULAR | Status: DC | PRN

## 2018-08-02 MED ORDER — LACTATED RINGERS IV SOLN
INTRAVENOUS | Status: DC | PRN
  Administered 2018-08-02: 07:00:00 via INTRAVENOUS

## 2018-08-02 MED ORDER — SODIUM CHLORIDE 0.9 % IV SOLN
INTRAVENOUS | Status: DC | PRN
  Administered 2018-08-02: 80 mL

## 2018-08-02 MED ORDER — METHOCARBAMOL 500 MG PO TABS: 500.0000 mg | ORAL_TABLET | Freq: Four times a day (QID) | ORAL | 0 refills | Status: AC

## 2018-08-02 MED ORDER — PROPOFOL 500 MG/50ML IV EMUL
INTRAVENOUS | Status: DC | PRN
  Administered 2018-08-02: 50 ug/kg/min via INTRAVENOUS

## 2018-08-02 MED ORDER — FENTANYL CITRATE (PF) 100 MCG/2ML IJ SOLN
INTRAMUSCULAR | Status: AC
  Filled 2018-08-02: qty 2

## 2018-08-02 MED ORDER — OFLOXACIN 0.3 % OP SOLN: 1.0000 [drp] | OPHTHALMIC | Status: DC

## 2018-08-02 MED ORDER — OXYCODONE-ACETAMINOPHEN 10-325 MG PO TABS: 1.0000 | ORAL_TABLET | Freq: Four times a day (QID) | ORAL | 0 refills | Status: AC | PRN

## 2018-08-02 MED ORDER — PHENYLEPHRINE HCL (PRESSORS) 10 MG/ML IV SOLN
INTRAVENOUS | Status: AC
  Filled 2018-08-02: qty 1

## 2018-08-02 MED ORDER — ASPIRIN EC 81 MG PO TBEC: 325.0000 mg | DELAYED_RELEASE_TABLET | Freq: Two times a day (BID) | ORAL | 0 refills | Status: AC

## 2018-08-02 MED ORDER — PROSIGHT PO TABS: 1.0000 | ORAL_TABLET | Freq: Every day | ORAL | Status: DC

## 2018-08-02 MED ORDER — OXYCODONE HCL ER 10 MG PO T12A: 10.0000 mg | EXTENDED_RELEASE_TABLET | Freq: Two times a day (BID) | ORAL | Status: DC

## 2018-08-02 MED ORDER — LUTEIN-ZEAXANTHIN 25-5 MG PO CAPS: 1.0000 | ORAL_CAPSULE | Freq: Every day | ORAL | Status: DC

## 2018-08-02 MED ORDER — PHENOL 1.4 % MT LIQD
1.0000 | OROMUCOSAL | Status: DC | PRN
  Filled 2018-08-02: qty 177

## 2018-08-02 MED ORDER — BUPIVACAINE IN DEXTROSE 0.75-8.25 % IT SOLN
INTRATHECAL | Status: DC | PRN
  Administered 2018-08-02: 1.6 mL via INTRATHECAL

## 2018-08-02 MED ORDER — INSULIN ASPART 100 UNIT/ML ~~LOC~~ SOLN: 0.0000 [IU] | Freq: Every day | SUBCUTANEOUS | Status: DC

## 2018-08-02 MED ORDER — POVIDONE-IODINE 10 % EX SWAB: 2.0000 "application " | Freq: Once | CUTANEOUS | Status: DC

## 2018-08-02 MED ORDER — ONDANSETRON HCL 4 MG PO TABS
4.0000 mg | ORAL_TABLET | Freq: Four times a day (QID) | ORAL | Status: DC | PRN
  Administered 2018-08-05: 4 mg via ORAL
  Filled 2018-08-02: qty 1

## 2018-08-02 MED ORDER — FENTANYL CITRATE (PF) 100 MCG/2ML IJ SOLN
INTRAMUSCULAR | Status: DC | PRN
  Administered 2018-08-02 (×3): 25 ug via INTRAVENOUS
  Administered 2018-08-02: 50 ug via INTRAVENOUS
  Administered 2018-08-02 (×2): 25 ug via INTRAVENOUS

## 2018-08-02 MED ORDER — VITAMIN D 25 MCG (1000 UNIT) PO TABS
2000.0000 [IU] | ORAL_TABLET | Freq: Every day | ORAL | Status: DC
  Filled 2018-08-02: qty 2

## 2018-08-02 MED ORDER — LORATADINE 10 MG PO TABS
10.0000 mg | ORAL_TABLET | Freq: Every day | ORAL | Status: DC
  Administered 2018-08-03 – 2018-08-06 (×4): 10 mg via ORAL
  Filled 2018-08-02 (×4): qty 1

## 2018-08-02 MED ORDER — OXYCODONE HCL 5 MG PO TABS
5.0000 mg | ORAL_TABLET | ORAL | Status: DC | PRN
  Administered 2018-08-02 – 2018-08-03 (×2): 10 mg via ORAL
  Administered 2018-08-05: 5 mg via ORAL
  Filled 2018-08-02 (×2): qty 2
  Filled 2018-08-02: qty 1

## 2018-08-02 MED ORDER — KETOTIFEN FUMARATE 0.025 % OP SOLN
1.0000 [drp] | Freq: Two times a day (BID) | OPHTHALMIC | Status: DC | PRN
  Administered 2018-08-05: 1 [drp] via OPHTHALMIC
  Filled 2018-08-02: qty 5

## 2018-08-02 MED ORDER — VANCOMYCIN HCL 1000 MG IV SOLR
1000.0000 mg | Freq: Once | INTRAVENOUS | Status: AC
  Administered 2018-08-02: 19:00:00 1000 mg via INTRAVENOUS
  Filled 2018-08-02: qty 1000

## 2018-08-02 MED ORDER — SODIUM CHLORIDE 0.9 % IR SOLN
Status: DC | PRN
  Administered 2018-08-02: 1000 mL

## 2018-08-02 MED ORDER — NEOMYCIN-POLYMYXIN-DEXAMETH 0.1 % OP OINT
1.0000 "application " | TOPICAL_OINTMENT | Freq: Three times a day (TID) | OPHTHALMIC | Status: DC
  Administered 2018-08-02 – 2018-08-06 (×12): 1 via OPHTHALMIC
  Filled 2018-08-02: qty 3.5

## 2018-08-02 MED ORDER — ACETAMINOPHEN 325 MG PO TABS
325.0000 mg | ORAL_TABLET | Freq: Four times a day (QID) | ORAL | Status: DC | PRN
  Administered 2018-08-04 – 2018-08-06 (×5): 650 mg via ORAL
  Filled 2018-08-02 (×5): qty 2

## 2018-08-02 MED ORDER — HYDROMORPHONE HCL 1 MG/ML IJ SOLN
0.2500 mg | INTRAMUSCULAR | Status: DC | PRN
  Administered 2018-08-02 (×4): 0.5 mg via INTRAVENOUS

## 2018-08-02 MED ORDER — ONDANSETRON HCL 4 MG/2ML IJ SOLN
INTRAMUSCULAR | Status: AC
  Filled 2018-08-02: qty 2

## 2018-08-02 MED ORDER — ONDANSETRON HCL 4 MG/2ML IJ SOLN
4.0000 mg | Freq: Four times a day (QID) | INTRAMUSCULAR | Status: DC | PRN
  Administered 2018-08-05: 4 mg via INTRAVENOUS
  Filled 2018-08-02: qty 2

## 2018-08-02 MED ORDER — ALPRAZOLAM 1 MG PO TABS
1.0000 mg | ORAL_TABLET | Freq: Every day | ORAL | Status: DC
  Administered 2018-08-02 – 2018-08-05 (×3): 1 mg via ORAL
  Filled 2018-08-02 (×4): qty 1

## 2018-08-02 MED ORDER — PROPOFOL 10 MG/ML IV BOLUS
INTRAVENOUS | Status: AC
  Filled 2018-08-02: qty 60

## 2018-08-02 MED ORDER — VITAMIN D 25 MCG (1000 UNIT) PO TABS
2000.0000 [IU] | ORAL_TABLET | Freq: Every day | ORAL | Status: DC
  Administered 2018-08-02 – 2018-08-06 (×5): 2000 [IU] via ORAL
  Filled 2018-08-02 (×5): qty 2

## 2018-08-02 MED ORDER — LIDOCAINE 2% (20 MG/ML) 5 ML SYRINGE
INTRAMUSCULAR | Status: DC | PRN
  Administered 2018-08-02: 60 mg via INTRAVENOUS

## 2018-08-02 MED ORDER — ONDANSETRON HCL 4 MG PO TABS: 4.0000 mg | ORAL_TABLET | Freq: Four times a day (QID) | ORAL | Status: DC | PRN

## 2018-08-02 MED ORDER — VANCOMYCIN HCL IN DEXTROSE 1-5 GM/200ML-% IV SOLN
1000.0000 mg | INTRAVENOUS | Status: AC
  Administered 2018-08-02: 1000 mg via INTRAVENOUS
  Filled 2018-08-02: qty 200

## 2018-08-02 MED ORDER — ADULT MULTIVITAMIN W/MINERALS CH
1.0000 | ORAL_TABLET | Freq: Every day | ORAL | Status: DC
  Administered 2018-08-02 – 2018-08-06 (×5): 1 via ORAL
  Filled 2018-08-02 (×5): qty 1

## 2018-08-02 MED ORDER — ONDANSETRON HCL 4 MG/2ML IJ SOLN
INTRAMUSCULAR | Status: DC | PRN
  Administered 2018-08-02: 4 mg via INTRAVENOUS

## 2018-08-02 MED ORDER — ENOXAPARIN SODIUM 30 MG/0.3ML ~~LOC~~ SOLN
30.0000 mg | Freq: Two times a day (BID) | SUBCUTANEOUS | Status: DC
  Administered 2018-08-03: 08:00:00 30 mg via SUBCUTANEOUS
  Filled 2018-08-02: qty 0.3

## 2018-08-02 MED ORDER — BUPIVACAINE HCL (PF) 0.5 % IJ SOLN
INTRAMUSCULAR | Status: DC | PRN
  Administered 2018-08-02: 20 mL via PERINEURAL

## 2018-08-02 MED ORDER — POLYETHYLENE GLYCOL 3350 17 G PO PACK
17.0000 g | PACK | Freq: Every day | ORAL | Status: DC | PRN
  Administered 2018-08-05 (×2): 17 g via ORAL
  Filled 2018-08-02 (×2): qty 1

## 2018-08-02 MED ORDER — POVIDONE-IODINE 7.5 % EX SOLN: Freq: Once | CUTANEOUS | Status: DC

## 2018-08-02 MED ORDER — GLIPIZIDE ER 10 MG PO TB24
10.0000 mg | ORAL_TABLET | Freq: Every evening | ORAL | Status: DC
  Administered 2018-08-02 – 2018-08-04 (×3): 10 mg via ORAL
  Filled 2018-08-02 (×3): qty 1

## 2018-08-02 MED ORDER — ASPIRIN EC 81 MG PO TBEC: 81.0000 mg | DELAYED_RELEASE_TABLET | Freq: Every day | ORAL | Status: DC

## 2018-08-02 MED ORDER — SODIUM CHLORIDE 0.45 % IV SOLN
INTRAVENOUS | Status: DC
  Administered 2018-08-02: 18:00:00 via INTRAVENOUS

## 2018-08-02 MED ORDER — DOCUSATE SODIUM 100 MG PO CAPS: 100.0000 mg | ORAL_CAPSULE | Freq: Two times a day (BID) | ORAL | Status: DC

## 2018-08-02 MED ORDER — METHOCARBAMOL 500 MG PO TABS
500.0000 mg | ORAL_TABLET | Freq: Four times a day (QID) | ORAL | Status: DC | PRN
  Administered 2018-08-02 – 2018-08-03 (×4): 500 mg via ORAL
  Filled 2018-08-02 (×3): qty 1

## 2018-08-02 MED ORDER — DEXAMETHASONE SODIUM PHOSPHATE 10 MG/ML IJ SOLN
INTRAMUSCULAR | Status: AC
  Filled 2018-08-02: qty 1

## 2018-08-02 MED ORDER — CLONIDINE HCL (ANALGESIA) 100 MCG/ML EP SOLN
EPIDURAL | Status: DC | PRN
  Administered 2018-08-02: 70 ug

## 2018-08-02 MED ORDER — SODIUM CHLORIDE 0.9 % IV SOLN
INTRAVENOUS | Status: DC
  Administered 2018-08-02: 06:00:00 via INTRAVENOUS

## 2018-08-02 MED ORDER — ADULT MULTIVITAMIN W/MINERALS CH
1.0000 | ORAL_TABLET | Freq: Every day | ORAL | Status: DC
  Filled 2018-08-02: qty 1

## 2018-08-02 MED ORDER — SODIUM CHLORIDE (PF) 0.9 % IJ SOLN
INTRAMUSCULAR | Status: AC
  Filled 2018-08-02: qty 10

## 2018-08-02 MED ORDER — SODIUM CHLORIDE (PF) 0.9 % IJ SOLN
INTRAMUSCULAR | Status: AC
  Filled 2018-08-02: qty 50

## 2018-08-02 MED ORDER — ONDANSETRON HCL 4 MG/2ML IJ SOLN
4.0000 mg | Freq: Four times a day (QID) | INTRAMUSCULAR | Status: DC | PRN
  Administered 2018-08-02: 4 mg via INTRAVENOUS
  Filled 2018-08-02: qty 2

## 2018-08-02 MED ORDER — MIDAZOLAM HCL 2 MG/2ML IJ SOLN
INTRAMUSCULAR | Status: AC
  Filled 2018-08-02: qty 2

## 2018-08-02 MED ORDER — BISACODYL 5 MG PO TBEC: 5.0000 mg | DELAYED_RELEASE_TABLET | Freq: Every day | ORAL | Status: DC | PRN

## 2018-08-02 MED ORDER — VANCOMYCIN HCL IN DEXTROSE 1-5 GM/200ML-% IV SOLN
1000.0000 mg | Freq: Two times a day (BID) | INTRAVENOUS | Status: DC
  Filled 2018-08-02: qty 200

## 2018-08-02 MED ORDER — VITAMIN C 500 MG PO TABS
500.0000 mg | ORAL_TABLET | Freq: Every day | ORAL | Status: DC
  Administered 2018-08-02 – 2018-08-06 (×5): 500 mg via ORAL
  Filled 2018-08-02 (×5): qty 1

## 2018-08-02 SURGICAL SUPPLY — 67 items
ATTUNE MED DOME PAT 32 KNEE (Knees) ×2 IMPLANT
ATTUNE MED DOME PAT 32MM KNEE (Knees) ×1 IMPLANT
ATTUNE PSFEM RTSZ6 NARCEM KNEE (Femur) ×3 IMPLANT
ATTUNE PSRP INSR SZ6 6 KNEE (Insert) ×2 IMPLANT
ATTUNE PSRP INSR SZ6 6MM KNEE (Insert) ×1 IMPLANT
BAG DECANTER FOR FLEXI CONT (MISCELLANEOUS) IMPLANT
BAG ZIPLOCK 12X15 (MISCELLANEOUS) ×6 IMPLANT
BANDAGE ACE 4X5 VEL STRL LF (GAUZE/BANDAGES/DRESSINGS) ×6 IMPLANT
BANDAGE ACE 6X5 VEL STRL LF (GAUZE/BANDAGES/DRESSINGS) ×9 IMPLANT
BASE TIBIA ATTUNE KNEE SYS SZ6 (Knees) ×1 IMPLANT
BLADE SAG 18X100X1.27 (BLADE) ×3 IMPLANT
BLADE SAW SGTL 11.0X1.19X90.0M (BLADE) ×3 IMPLANT
BONE CEMENT GENTAMICIN (Cement) ×6 IMPLANT
BOWL SMART MIX CTS (DISPOSABLE) ×3 IMPLANT
CEMENT BONE GENTAMICIN 40 (Cement) ×2 IMPLANT
CHLORAPREP W/TINT 26 (MISCELLANEOUS) ×6 IMPLANT
COVER SURGICAL LIGHT HANDLE (MISCELLANEOUS) ×3 IMPLANT
COVER WAND RF STERILE (DRAPES) IMPLANT
CUFF TOURN SGL QUICK 34 (TOURNIQUET CUFF) ×2
CUFF TRNQT CYL 34X4.125X (TOURNIQUET CUFF) ×1 IMPLANT
DECANTER SPIKE VIAL GLASS SM (MISCELLANEOUS) ×3 IMPLANT
DERMABOND ADVANCED (GAUZE/BANDAGES/DRESSINGS) ×2
DERMABOND ADVANCED .7 DNX12 (GAUZE/BANDAGES/DRESSINGS) ×1 IMPLANT
DRAPE INCISE 23X17 IOBAN STRL (DRAPES) ×2
DRAPE INCISE IOBAN 23X17 STRL (DRAPES) ×1 IMPLANT
DRAPE U-SHAPE 47X51 STRL (DRAPES) ×3 IMPLANT
DRSG AQUACEL AG ADV 3.5X10 (GAUZE/BANDAGES/DRESSINGS) ×3 IMPLANT
DRSG TEGADERM 4X4.75 (GAUZE/BANDAGES/DRESSINGS) ×3 IMPLANT
DURAPREP 26ML APPLICATOR (WOUND CARE) IMPLANT
ELECT REM PT RETURN 15FT ADLT (MISCELLANEOUS) ×3 IMPLANT
EVACUATOR 1/8 PVC DRAIN (DRAIN) ×3 IMPLANT
GAUZE SPONGE 2X2 8PLY STRL LF (GAUZE/BANDAGES/DRESSINGS) ×1 IMPLANT
GLOVE BIO SURGEON STRL SZ7.5 (GLOVE) ×6 IMPLANT
GLOVE BIOGEL PI IND STRL 8 (GLOVE) ×2 IMPLANT
GLOVE BIOGEL PI INDICATOR 8 (GLOVE) ×4
GLOVE ECLIPSE 8.0 STRL XLNG CF (GLOVE) ×9 IMPLANT
GLOVE SURG ORTHO 9.0 STRL STRW (GLOVE) ×3 IMPLANT
GOWN STRL REUS W/TWL XL LVL3 (GOWN DISPOSABLE) ×6 IMPLANT
HANDPIECE INTERPULSE COAX TIP (DISPOSABLE) ×2
HOLDER FOLEY CATH W/STRAP (MISCELLANEOUS) ×3 IMPLANT
KIT TURNOVER KIT A (KITS) IMPLANT
NS IRRIG 1000ML POUR BTL (IV SOLUTION) ×3 IMPLANT
PACK TOTAL KNEE CUSTOM (KITS) ×3 IMPLANT
PIN THREADED HEADED SIGMA (PIN) ×3 IMPLANT
PROTECTOR NERVE ULNAR (MISCELLANEOUS) ×3 IMPLANT
SET HNDPC FAN SPRY TIP SCT (DISPOSABLE) ×1 IMPLANT
SET PAD KNEE POSITIONER (MISCELLANEOUS) ×3 IMPLANT
SPONGE GAUZE 2X2 STER 10/PKG (GAUZE/BANDAGES/DRESSINGS) ×2
SPONGE LAP 18X18 RF (DISPOSABLE) IMPLANT
SPONGE SURGIFOAM ABS GEL 100 (HEMOSTASIS) ×3 IMPLANT
STOCKINETTE 6  STRL (DRAPES) ×2
STOCKINETTE 6 STRL (DRAPES) ×1 IMPLANT
SUT BONE WAX W31G (SUTURE) IMPLANT
SUT MNCRL AB 3-0 PS2 18 (SUTURE) ×3 IMPLANT
SUT VIC AB 1 CT1 27 (SUTURE) ×6
SUT VIC AB 1 CT1 27XBRD ANTBC (SUTURE) ×3 IMPLANT
SUT VIC AB 2-0 CT1 27 (SUTURE) ×4
SUT VIC AB 2-0 CT1 TAPERPNT 27 (SUTURE) ×2 IMPLANT
SUT VLOC 180 0 24IN GS25 (SUTURE) ×3 IMPLANT
SYR 50ML LL SCALE MARK (SYRINGE) IMPLANT
TAPE STRIPS DRAPE STRL (GAUZE/BANDAGES/DRESSINGS) ×3 IMPLANT
TIBIA ATTUNE KNEE SYS BASE SZ6 (Knees) ×3 IMPLANT
TRAY FOLEY MTR SLVR 14FR STAT (SET/KITS/TRAYS/PACK) ×3 IMPLANT
TRAY FOLEY MTR SLVR 16FR STAT (SET/KITS/TRAYS/PACK) IMPLANT
WATER STERILE IRR 1000ML POUR (IV SOLUTION) ×6 IMPLANT
WRAP KNEE MAXI GEL POST OP (GAUZE/BANDAGES/DRESSINGS) ×3 IMPLANT
YANKAUER SUCT BULB TIP 10FT TU (MISCELLANEOUS) ×3 IMPLANT

## 2018-08-02 NOTE — Progress Notes (Signed)
PT Cancellation Note  Patient Details Name: Catherine Rich MRN: [id number] DOB: 1928/06/29   Cancelled Treatment:    Reason Eval/Treat Not Completed: Pain limiting ability to participate - Pt in 10/10 pain when PT checked on pt x2 this pm, once at 1600 and once at 1730. Pt lacking orders for pain meds for multiple hours after arrival to floor, RN and pt trying to control pain currently. Pt defers PT until tomorrow, will see pt tomorrow am.  Julien Girt, PT Acute Rehabilitation Services Pager [phone]  Office [phone]    [place] 08/02/2018, 5:35 PM

## 2018-08-02 NOTE — Interval H&P Note (Signed)
History and Physical Interval Note:  08/02/2018 7:42 AM  Catherine Rich  has presented today for surgery, with the diagnosis of Right knee osteoarthritis.  The various methods of treatment have been discussed with the patient and family. After consideration of risks, benefits and other options for treatment, the patient has consented to  Procedure(s) with comments: TOTAL KNEE ARTHROPLASTY (Right) - with adductor canal as a surgical intervention.  The patient's history has been reviewed, patient examined, no change in status, stable for surgery.  I have reviewed the patient's chart and labs.  Questions were answered to the patient's satisfaction.     Catherine Rich ANDREW

## 2018-08-02 NOTE — Anesthesia Postprocedure Evaluation (Signed)
Anesthesia Post Note  Patient: [name]  Procedure(s) Performed: TOTAL KNEE ARTHROPLASTY (Right )     Patient location during evaluation: PACU Anesthesia Type: Spinal Level of consciousness: oriented and awake and alert Pain management: pain level controlled Vital Signs Assessment: post-procedure vital signs reviewed and stable Respiratory status: spontaneous breathing, respiratory function stable and patient connected to nasal cannula oxygen Cardiovascular status: blood pressure returned to baseline and stable Postop Assessment: no headache, no backache and no apparent nausea or vomiting Anesthetic complications: no    Last Vitals:  Vitals:   08/02/18 1100 08/02/18 1115  BP: (!) 129/45 (!) 142/48  Pulse: 73 77  Resp: 13 16  Temp:    SpO2: 92% 92%    Last Pain:  Vitals:   08/02/18 1115  TempSrc:   PainSc: Asleep                 [name]

## 2018-08-02 NOTE — Anesthesia Procedure Notes (Signed)
Anesthesia Regional Block: Adductor canal block   Pre-Anesthetic Checklist: ,, timeout performed, Correct Patient, Correct Site, Correct Laterality, Correct Procedure, Correct Position, site marked, Risks and benefits discussed,  Surgical consent,  Pre-op evaluation,  At surgeon's request and post-op pain management  Laterality: Right  Prep: chloraprep       Needles:  Injection technique: Single-shot  Needle Type: Echogenic Needle     Needle Length: 9cm      Additional Needles:   Procedures:,,,, ultrasound used (permanent image in chart),,,,  Narrative:  Start time: 08/02/2018 7:01 AM End time: 08/02/2018 7:12 AM Injection made incrementally with aspirations every 5 mL.  Performed by: Personally  Anesthesiologist: Myrtie Soman, MD  Additional Notes: Patient tolerated the procedure well without complications

## 2018-08-02 NOTE — Plan of Care (Signed)

## 2018-08-02 NOTE — Progress Notes (Addendum)
Pt has performed 6/10 doses of mupirocin. Last dose was this morning. Pt allergic to betadine, so unable to perform nasal betadine swab.   Dr. Theda Sers aware.

## 2018-08-02 NOTE — Transfer of Care (Signed)
Immediate Anesthesia Transfer of Care Note  Patient: Catherine Rich  Procedure(s) Performed: TOTAL KNEE ARTHROPLASTY (Right )  Patient Location: PACU  Anesthesia Type:Spinal  Level of Consciousness: awake, alert  and oriented  Airway & Oxygen Therapy: Patient Spontanous Breathing and Patient connected to face mask oxygen  Post-op Assessment: Report given to RN and Post -op Vital signs reviewed and stable  Post vital signs: Reviewed and stable  Last Vitals:  Vitals Value Taken Time  BP 101/83 08/02/18 1030  Temp    Pulse 75 08/02/18 1032  Resp 16 08/02/18 1032  SpO2 97 % 08/02/18 1032  Vitals shown include unvalidated device data.  Last Pain:  Vitals:   08/02/18 0547  TempSrc: Oral  PainSc:       Patients Stated Pain Goal: 4 (83/66/29 4765)  Complications: No apparent anesthesia complications

## 2018-08-02 NOTE — Op Note (Signed)
DATE OF SURGERY:  08/02/2018  TIME: 10:09 AM  PATIENT NAME:  Catherine Rich    AGE: 85 y.o.   PRE-OPERATIVE DIAGNOSIS:  Right knee osteoarthritis  POST-OPERATIVE DIAGNOSIS:  Right knee osteoarthritis  PROCEDURE:  Procedure(s): TOTAL KNEE ARTHROPLASTY  SURGEON:  Mordche Hedglin ANDREW  ASSISTANT:  Bryson Stilwell, PA-C, present and scrubbed throughout the case, critical for assistance with exposure, retraction, instrumentation, and closure.  OPERATIVE IMPLANTS: Depuy PFC Attune  Rotating Platform.  Femur size 6, Tibia size 6, Patella size 32 3-peg oval button, with a 6 mm polyethylene insert.   PREOPERATIVE INDICATIONS:   Catherine Rich is a 85 y.o. year old female with end stage bone on bone arthritis of the knee who failed conservative treatment and elected for Total Knee Arthroplasty.   The risks, benefits, and alternatives were discussed at length including but not limited to the risks of infection, bleeding, nerve injury, stiffness, blood clots, the need for revision surgery, cardiopulmonary complications, among others, and they were willing to proceed.  OPERATIVE DESCRIPTION:  The patient was brought to the operative room and placed in a supine position.  Spinal anesthesia was administered.  IV antibiotics were given.  The lower extremity was prepped and draped in the usual sterile fashion.  Time out was performed.  The leg was elevated and exsanguinated and the tourniquet was inflated.  Anterior quadriceps tendon splitting approach was performed.  The patella was retracted and osteophytes were removed.  The anterior horn of the medial and lateral meniscus was removed and cruciate ligaments resected.   The distal femur was opened with the drill and the intramedullary distal femoral cutting jig was utilized, set at 5 degrees resecting 10 mm off the distal femur.  Care was taken to protect the collateral ligaments.  The distal femoral sizing jig was applied, taking care to  avoid notching.  Then the 4-in-1 cutting jig was applied and the anterior and posterior femur was cut, along with the chamfer cuts.    Then the extramedullary tibial cutting jig was utilized making the appropriate cut using the anterior tibial crest as a reference building in appropriate posterior slope.  Care was taken during the cut to protect the medial and collateral ligaments.  The proximal tibia was removed along with the posterior horns of the menisci.   The posterior medial femoral osteophytes and posterior lateral femoral osteophytes were removed.    The flexion gap was then measured and was symmetric with the extension gap, measured at 6  I completed the distal femoral preparation using the appropriate jig to prepare the box.  The patella was then measured, and cut with the saw.    The proximal tibia sized and prepared accordingly with the reamer and the punch, and then all components were trialed with the trial insert.  The knee was found to have excellent balance and full motion.    The above named components were then cemented into place and all excess cement was removed.  The trial polyethylene component was in place during cementation, and then was exchanged for the real polyethylene component.    The knee was easily taken through a range of motion and the patella tracked well and the knee irrigated copiously and the parapatellar and subcutaneous tissue closed with vicryl, and monocryl with steri strips for the skin.  The arthrotomy was closed at 90 of flexion. The wounds were dressed with sterile gauze and the tourniquet released and the patient was awakened and returned to the PACU  in stable and satisfactory condition.  There were no complications.  Total tourniquet time was 85 minutes.

## 2018-08-02 NOTE — Discharge Instructions (Signed)

## 2018-08-02 NOTE — Anesthesia Procedure Notes (Signed)
Spinal  Patient location during procedure: OR Start time: 08/02/2018 7:49 AM End time: 08/02/2018 7:54 AM Staffing Anesthesiologist: Myrtie Soman, MD Performed: anesthesiologist  Preanesthetic Checklist Completed: patient identified, site marked, surgical consent, pre-op evaluation, timeout performed, IV checked, risks and benefits discussed and monitors and equipment checked Spinal Block Patient position: sitting Prep: ChloraPrep Patient monitoring: heart rate, continuous pulse ox and blood pressure Approach: midline Location: L3-4 Injection technique: single-shot Needle Needle type: Spinocan  Needle gauge: 22 G Needle length: 9 cm Additional Notes Expiration date of kit checked and confirmed. Patient tolerated procedure well, without complications.

## 2018-08-02 NOTE — Anesthesia Procedure Notes (Signed)
Anesthesia Procedure Image    

## 2018-08-02 NOTE — Anesthesia Procedure Notes (Signed)
Procedure Name: MAC Date/Time: 08/02/2018 7:50 AM Performed by: Eben Burow, CRNA Pre-anesthesia Checklist: Patient identified, Emergency Drugs available, Suction available, Patient being monitored and Timeout performed Oxygen Delivery Method: Simple face mask Dental Injury: Teeth and Oropharynx as per pre-operative assessment

## 2018-08-02 NOTE — Progress Notes (Signed)
RN spoke with Aldona Bar, [place] regarding ASA orders.  Per telephone order, ok to d/c ASA 81mg  daily order.  Pt will start lovenox during hospital stay per order.

## 2018-08-03 LAB — GLUCOSE, CAPILLARY
Glucose-Capillary: 114 mg/dL — ABNORMAL HIGH (ref 70–99)
Glucose-Capillary: 136 mg/dL — ABNORMAL HIGH (ref 70–99)
Glucose-Capillary: 152 mg/dL — ABNORMAL HIGH (ref 70–99)
Glucose-Capillary: 46 mg/dL — ABNORMAL LOW (ref 70–99)
Glucose-Capillary: 49 mg/dL — ABNORMAL LOW (ref 70–99)
Glucose-Capillary: 50 mg/dL — ABNORMAL LOW (ref 70–99)
Glucose-Capillary: 51 mg/dL — ABNORMAL LOW (ref 70–99)
Glucose-Capillary: 82 mg/dL (ref 70–99)

## 2018-08-03 LAB — CREATININE, SERUM
Creatinine, Ser: 1.6 mg/dL — ABNORMAL HIGH (ref 0.44–1.00)
GFR calc Af Amer: 33 mL/min — ABNORMAL LOW (ref >60)
GFR calc non Af Amer: 29 mL/min — ABNORMAL LOW (ref >60)

## 2018-08-03 MED ORDER — ENOXAPARIN SODIUM 30 MG/0.3ML ~~LOC~~ SOLN
30.0000 mg | SUBCUTANEOUS | Status: DC
  Administered 2018-08-04 – 2018-08-06 (×3): 30 mg via SUBCUTANEOUS
  Filled 2018-08-03 (×3): qty 0.3

## 2018-08-03 NOTE — TOC Initial Note (Signed)
Transition of Care [facility]) - Initial/Assessment Note    Patient Details  Name: Catherine Rich MRN: [id number] Date of Birth: 1928-05-31  Transition of Care (TOC) CM/SW Contact:    Joaquin Courts, RN Phone Number: 08/03/2018, 10:19 AM  Clinical Narrative:       CM spoke with patient at bedside and daughter, Kalman Shan, over the telephone with patient permission. HHPT set up with [facility]). Patient reports she has rolling walker and 3-in-1 at home.              Expected Discharge Plan: [place] Barriers to Discharge: Continued Medical Work up   Patient Goals and CMS Choice Patient states their goals for this hospitalization and ongoing recovery are:: to go home CMS Medicare.gov Compare Post Acute Care list provided to:: Patient Choice offered to / list presented to : Patient  Expected Discharge Plan and Services Expected Discharge Plan: [place]   Discharge Planning Services: CM Consult Post Acute Care Choice: [place] arrangements for the past 2 months: Single Family Home                 DME Arranged: N/A DME Agency: NA       HH Arranged: PT [place] Agency: [facility] ([place]) Date HH Agency Contacted: 08/03/18 Time [place]: 1018 Representative spoke with at [place]: Corene Cornea  Prior Living Arrangements/Services Living arrangements for the past 2 months: [place] with:: Self, Adult Children Patient language and need for interpreter reviewed:: Yes Do you feel safe going back to the place where you live?: Yes      Need for Family Participation in Patient Care: Yes (Comment) Care giver support system in place?: Yes (comment)   Criminal Activity/Legal Involvement Pertinent to Current Situation/Hospitalization: No - Comment as needed  Activities of Daily Living Home Assistive Devices/Equipment: Cane (specify quad or straight), Walker (specify type), Raised toilet  seat with rails, Eyeglasses, Bedside commode/3-in-1, Dentures (specify type), CBG Meter ADL Screening (condition at time of admission) Patient's cognitive ability adequate to safely complete daily activities?: Yes Is the patient deaf or have difficulty hearing?: Yes Does the patient have difficulty seeing, even when wearing glasses/contacts?: Yes Does the patient have difficulty concentrating, remembering, or making decisions?: No Patient able to express need for assistance with ADLs?: Yes Does the patient have difficulty dressing or bathing?: No Independently performs ADLs?: Yes (appropriate for developmental age) Does the patient have difficulty walking or climbing stairs?: No Weakness of Legs: Right Weakness of Arms/Hands: None  Permission Sought/Granted                  Emotional Assessment Appearance:: Appears stated age Attitude/Demeanor/Rapport: Engaged Affect (typically observed): Accepting Orientation: : Oriented to Place, Oriented to  Time, Oriented to Situation, Oriented to Self   Psych Involvement: No (comment)  Admission diagnosis:  Right knee osteoarthritis Patient Active Problem List   Diagnosis Date Noted  . Osteoarthritis of right knee 08/02/2018  . S/P TKR (total knee replacement) using cement, right 08/02/2018   PCP:  Caryl Bis, MD Pharmacy:   [facility], [place] [address] Phone: [phone] Fax: [phone]     Social Determinants of Health (SDOH) Interventions    Readmission Risk Interventions No flowsheet data found.

## 2018-08-03 NOTE — Progress Notes (Signed)
Subjective: 1 Day Post-Op Procedure(s) (LRB): TOTAL KNEE ARTHROPLASTY (Right) Patient reports pain as 10 on 0-10 scale.  While patient rates pain as 10/10, she appears to be resting comfortably.  Foley removed, due to void -Flatus, -BM; no abdominal pain nor distention.  Has yet to ambulate, due for ambulation with PT.  Tolerating PO intake, reports some t N/V. Denies HA, dizziness, CP, SOB, calf pain.   Objective: Vital signs in last 24 hours: Temp:  [96.4 F (35.8 C)-98.4 F (36.9 C)] 98.4 F (36.9 C) (06/20 0737) Pulse Rate:  [71-106] 85 (06/20 0737) Resp:  [10-20] 18 (06/20 0737) BP: (101-162)/(44-90) 162/54 (06/20 0737) SpO2:  [89 %-98 %] 92 % (06/20 0737)  Intake/Output from previous day: 06/19 0701 - 06/20 0700 In: 2395.8 [P.O.:600; I.V.:1795.8] Out: 77 [Urine:2100; Drains:340; Blood:50] Intake/Output this shift: Total I/O In: 240 [P.O.:240] Out: -   Recent Labs    08/02/18 1330  HGB 12.1   Recent Labs    08/02/18 1330  WBC 8.7  RBC 4.31  HCT 39.7  PLT 203   Recent Labs    08/02/18 1330 08/03/18 0707  CREATININE 1.74* 1.60*   No results for input(s): LABPT, INR in the last 72 hours.  Neurologically intact ABD soft Neurovascular intact Sensation intact distally Intact pulses distally Dorsiflexion/Plantar flexion intact No cellulitis present Compartment soft Dressing: moderate amount of dry blood on dressing. No signs of ongoing bleeding nor infection. Drain removed today without difficulty.   Assessment/Plan: 1 Day Post-Op Procedure(s) (LRB): TOTAL KNEE ARTHROPLASTY (Right)   DVT PPx: on lovenox; Per pharmacy due to Cr clearance will decrease dose to 30 mg QD Encourage IS While patient reports pain as 10/10, she is resting rather comfortably. Do not want to increase pain medication at this time. Will continue to monitor. Zofran PRN nausea.  Advance diet Up with therapy Plan for discharge tomorrow if +void, +flatus   Anticipated LOS  equal to or greater than 2 midnights due to - Age 83 and older with one or more of the following:  - Obesity  - Expected need for hospital services (PT, OT, Nursing) required for safe  discharge  - Anticipated need for postoperative skilled nursing care or inpatient rehab  - Active co-morbidities: Diabetes OR   - Unanticipated findings during/Post Surgery: None  - Patient is a high risk of re-admission due to: None    Yvonne Kendall Ward 08/03/2018, 8:39 AM

## 2018-08-03 NOTE — Progress Notes (Signed)
    Home health agencies that serve [phone].        Home Health Agencies Search Results  Results List Table  Home Health Agency Information Quality of Patient Care Rating Patient Survey Summary Rating  [facility] [phone] 4 out of 5 stars 4 out of 5 stars  [facility] [phone] 4  out of 5 stars 3 out of 5 stars  [facility] [phone] 4 out of 5 stars 4 out of 5 stars  [facility] [phone] 4 out of 5 stars 4 out of 5 stars  [facility] [phone] 3  out of 5 stars 4 out of 5 stars  [facility] [phone] 3 out of 5 stars 4 out of 5 stars  [facility] [phone] 3  out of 5 stars 4 out of 5 stars  [facility] [phone] 4  out of 5 stars 3 out of 5 stars   Home Health Footnotes  Footnote number Footnote as displayed on Home Health Compare  1 This agency provides services under a federal waiver program to non-traditional, chronic long term population.  2 This agency provides services to a special needs population.  3 Not Available.  4 The number of patient episodes for this measure is too small to report.  5 This measure currently does not have data or provider has been certified/recertified for less than 6 months.  6 The national average for this measure is not provided because of state-to-state differences in data collection.  7 Medicare is not displaying rates for this measure for any home health agency, because of an issue with the data.  8 There were problems with the data and they are being corrected.  9 Zero, or very few, patients met the survey's rules for inclusion. The scores shown, if any, reflect a very small number of surveys and may not accurately tell how an agency is doing.  10 Survey results are based on less than 12 months of data.  11 Fewer than 70 patients completed the survey. Use the scores shown, if any,  with caution as the number of surveys may be too low to accurately tell how an agency is doing.  12 No survey results are available for this period.  13 Data suppressed by CMS for one or more quarters.    

## 2018-08-03 NOTE — Plan of Care (Signed)
  Problem: Education: Goal: Knowledge of General Education information will improve Description: Including pain rating scale, medication(s)/side effects and non-pharmacologic comfort measures Outcome: Progressing   Problem: Clinical Measurements: Goal: Ability to maintain clinical measurements within normal limits will improve Outcome: Progressing   Problem: Activity: Goal: Risk for activity intolerance will decrease Outcome: Progressing   Problem: Nutrition: Goal: Adequate nutrition will be maintained Outcome: Progressing   Problem: Coping: Goal: Level of anxiety will decrease Outcome: Progressing   Problem: Pain Managment: Goal: General experience of comfort will improve Outcome: Progressing   Problem: Pain Management: Goal: Pain level will decrease with appropriate interventions Outcome: Progressing

## 2018-08-03 NOTE — Progress Notes (Signed)
Physical Therapy Treatment Patient Details Name: Catherine Rich MRN: [id number] DOB: 03-Oct-1928 Today's Date: 08/03/2018    History of Present Illness s/p  R TKA    PT Comments    Pt progressing slowly, continued need for supplemental O2  To maintain sats >90% during mobility. Fatigues quickly and able to transfer  back to bed however unable to amb d/t pain and fatigue. Continue to follow in acute setting   Follow Up Recommendations  Follow surgeon's recommendation for DC plan and follow-up therapies;Supervision for mobility/OOB     Equipment Recommendations  Rolling walker with 5" wheels    Recommendations for Other Services       Precautions / Restrictions Precautions Precautions: Knee;Fall Restrictions Weight Bearing Restrictions: No Other Position/Activity Restrictions: WBAT    Mobility  Bed Mobility Overal bed mobility: Needs Assistance Bed Mobility: Sit to Supine       Sit to supine: Min assist   General bed mobility comments: assist with RLE, cues for technique  Transfers Overall transfer level: Needs assistance Equipment used: Rolling walker (2 wheeled) Transfers: Sit to/from [facility] Sit to Stand: Mod assist;Min assist;+2 safety/equipment;+2 physical assistance Stand pivot transfers: Mod assist;+2 safety/equipment;+2 physical assistance       General transfer comment: multi-modal cues for hand placement, technique, safety; assist required throughout for balance  Ambulation/Gait                 Stairs             Wheelchair Mobility    Modified Rankin (Stroke Patients Only)       Balance   Sitting-balance support: Feet supported;Bilateral upper extremity supported         Standing balance-Leahy Scale: Poor Standing balance comment: heavily reliant on UEs and external assist                            Cognition Arousal/Alertness: Awake/alert Behavior During Therapy: WFL for tasks  assessed/performed Overall Cognitive Status: Within Functional Limits for tasks assessed                                        Exercises Total Joint Exercises Ankle Circles/Pumps: AROM;Both;10 reps Quad Sets: AROM;Right;10 reps;Strengthening;Both Heel Slides: AAROM;Right;10 reps Hip ABduction/ADduction: AAROM;Right;10 reps Straight Leg Raises: AAROM;Right;10 reps    General Comments        Pertinent Vitals/Pain Pain Assessment: 0-10 Pain Score: 3  Pain Location: right knee Pain Descriptors / Indicators: Guarding;Grimacing Pain Intervention(s): Limited activity within patient's tolerance;Monitored during session;Repositioned    Home Living                      Prior Function            PT Goals (current goals can now be found in the care plan section) Acute Rehab PT Goals Patient Stated Goal: home PT Goal Formulation: With patient Time For Goal Achievement: 08/10/18 Potential to Achieve Goals: Good Progress towards PT goals: Progressing toward goals    Frequency    7X/week      PT Plan Current plan remains appropriate    Co-evaluation              AM-PAC PT "6 Clicks" Mobility   Outcome Measure  Help needed turning from your back to your side while in a flat bed without  using bedrails?: A Lot Help needed moving from lying on your back to sitting on the side of a flat bed without using bedrails?: A Lot Help needed moving to and from a bed to a chair (including a wheelchair)?: A Lot Help needed standing up from a chair using your arms (e.g., wheelchair or bedside chair)?: A Lot Help needed to walk in hospital room?: A Lot Help needed climbing 3-5 steps with a railing? : Total 6 Click Score: 11    End of Session Equipment Utilized During Treatment: Gait belt Activity Tolerance: Patient limited by pain;Patient limited by fatigue Patient left: in bed;with call bell/phone within reach;with bed alarm set Nurse Communication:  Mobility status PT Visit Diagnosis: Difficulty in walking, not elsewhere classified (R26.2);Muscle weakness (generalized) (M62.81)     Time: 4540-9811 PT Time Calculation (min) (ACUTE ONLY): 14 min  Charges:  $Therapeutic Exercise: 8-22 mins                     Kenyon Ana, PT  Pager: [phone] Acute Rehab Dept [facility]): [phone]   08/03/2018    [facility] 08/03/2018, 4:13 PM

## 2018-08-03 NOTE — Evaluation (Signed)
Physical Therapy Evaluation Patient Details Name: Catherine Rich MRN: [id number] DOB: 07-Jun-1928 Today's Date: 08/03/2018   History of Present Illness  s/p  R TKA  Clinical Impression  Pt is s/p TKA resulting in the deficits listed below (see PT Problem List). * Pt requiring +2 assist for bed mobility and transfers, will likely need at least 2 more days of PT. SpO2 = 88-90% on 2L Pt will benefit from skilled PT to increase their independence and safety with mobility to allow discharge to the venue listed below.      Follow Up Recommendations Follow surgeon's recommendation for DC plan and follow-up therapies;Supervision for mobility/OOB    Equipment Recommendations  Rolling walker with 5" wheels    Recommendations for Other Services       Precautions / Restrictions Precautions Precautions: Knee;Fall Restrictions Weight Bearing Restrictions: No      Mobility  Bed Mobility Overal bed mobility: Needs Assistance Bed Mobility: Supine to Sit     Supine to sit: Min assist;Mod assist;+2 for physical assistance;+2 for safety/equipment     General bed mobility comments: incr time, effortful, bed pad used to assist scooting  Transfers Overall transfer level: Needs assistance Equipment used: Rolling walker (2 wheeled) Transfers: Sit to/from [facility] Sit to Stand: Mod assist;+2 safety/equipment;+2 physical assistance Stand pivot transfers: Mod assist;+2 safety/equipment;+2 physical assistance       General transfer comment: multi-modal cues for hand placement, technique, safety; assist required throughout for balance  Ambulation/Gait             General Gait Details: pivotal steps only  Stairs            Wheelchair Mobility    Modified Rankin (Stroke Patients Only)       Balance Overall balance assessment: Needs assistance Sitting-balance support: Feet supported;Bilateral upper extremity supported Sitting balance-Leahy Scale:  Fair       Standing balance-Leahy Scale: Poor Standing balance comment: heavily reliant on UEs and external assist                             Pertinent Vitals/Pain Pain Assessment: 0-10 Pain Score: 10-Worst pain ever("50") Pain Location: right knee Pain Descriptors / Indicators: Guarding;Grimacing Pain Intervention(s): Limited activity within patient's tolerance;Monitored during session;Premedicated before session;Ice applied    Home Living Family/patient expects to be discharged to:: Private residence Living Arrangements: (dtr) Available Help at Discharge: Available PRN/intermittently Type of Home: Mobile home Home Access: Stairs to enter Entrance Stairs-Rails: Right Entrance Stairs-Number of Steps: 4 Home Layout: One level Home Equipment: Shower seat;Wheelchair - manual;Cane - single point;Walker - 4 wheels      Prior Function Level of Independence: Independent               Hand Dominance        Extremity/Trunk Assessment   Upper Extremity Assessment Upper Extremity Assessment: Generalized weakness    Lower Extremity Assessment Lower Extremity Assessment: RLE deficits/detail RLE Deficits / Details: AAROM 7* to 60*; knee extension and hip flexion 2+/5       Communication   Communication: No difficulties  Cognition Arousal/Alertness: Awake/alert Behavior During Therapy: WFL for tasks assessed/performed Overall Cognitive Status: Within Functional Limits for tasks assessed                                        General Comments  Exercises Total Joint Exercises Ankle Circles/Pumps: AROM;Both;5 reps   Assessment/Plan    PT Assessment Patient needs continued PT services  PT Problem List Decreased strength;Decreased mobility;Decreased safety awareness;Decreased range of motion;Decreased activity tolerance;Decreased balance;Decreased knowledge of use of DME;Pain       PT Treatment Interventions DME  instruction;Therapeutic exercise;Gait training;Functional mobility training;Therapeutic activities;Patient/family education;Stair training    PT Goals (Current goals can be found in the Care Plan section)  Acute Rehab PT Goals Patient Stated Goal: home PT Goal Formulation: With patient Time For Goal Achievement: 08/10/18 Potential to Achieve Goals: Good    Frequency 7X/week   Barriers to discharge        Co-evaluation               AM-PAC PT "6 Clicks" Mobility  Outcome Measure Help needed turning from your back to your side while in a flat bed without using bedrails?: A Lot Help needed moving from lying on your back to sitting on the side of a flat bed without using bedrails?: A Lot Help needed moving to and from a bed to a chair (including a wheelchair)?: A Lot Help needed standing up from a chair using your arms (e.g., wheelchair or bedside chair)?: A Lot Help needed to walk in hospital room?: A Lot Help needed climbing 3-5 steps with a railing? : Total 6 Click Score: 11    End of Session Equipment Utilized During Treatment: Gait belt Activity Tolerance: Patient limited by pain;Patient limited by fatigue Patient left: in chair;with call bell/phone within reach;with chair alarm set Nurse Communication: Mobility status PT Visit Diagnosis: Difficulty in walking, not elsewhere classified (R26.2);Muscle weakness (generalized) (M62.81)    Time: 4008-6761 PT Time Calculation (min) (ACUTE ONLY): 26 min   Charges:   PT Evaluation $PT Eval Low Complexity: 1 Low PT Treatments $Therapeutic Activity: 8-22 mins        Kenyon Ana, PT  Pager: [phone] Acute Rehab Dept [facility]): [phone]   08/03/2018   [facility] 08/03/2018, 11:17 AM

## 2018-08-04 LAB — GLUCOSE, CAPILLARY
Glucose-Capillary: 163 mg/dL — ABNORMAL HIGH (ref 70–99)
Glucose-Capillary: 182 mg/dL — ABNORMAL HIGH (ref 70–99)
Glucose-Capillary: 135 mg/dL — ABNORMAL HIGH (ref 70–99)
Glucose-Capillary: 85 mg/dL (ref 70–99)

## 2018-08-04 NOTE — Progress Notes (Signed)
   08/04/18 1600  PT Visit Information  Last PT Received On 08/04/18  Assistance Needed +2 (safety)  History of Present Illness s/p  R TKA  Subjective Data  Patient Stated Goal home  Precautions  Precautions Knee;Fall  Restrictions  Weight Bearing Restrictions No  Other Position/Activity Restrictions WBAT  Pain Assessment  Pain Assessment 0-10  Pain Score 5  Pain Location right knee  Pain Descriptors / Indicators Guarding;Grimacing  Pain Intervention(s) Limited activity within patient's tolerance;Monitored during session  Cognition  Arousal/Alertness Awake/alert  Behavior During Therapy WFL for tasks assessed/performed  Overall Cognitive Status Within Functional Limits for tasks assessed  Bed Mobility  General bed mobility comments pt in bed, too fatigued to amb--just back to bed with nursing  Total Joint Exercises  Ankle Circles/Pumps AROM;Both;10 reps  Quad Sets AROM;Right;10 reps;Strengthening;Both  Heel Slides AAROM;Right;10 reps  Straight Leg Raises AAROM;Right;10 reps  Hip ABduction/ADduction AAROM;Right;10 reps  Short Arc Quad AAROM;AROM;Right;10 reps  PT - End of Session  Equipment Utilized During Treatment Gait belt  Activity Tolerance Patient tolerated treatment well;Patient limited by fatigue  Patient left with call bell/phone within reach;in bed;with bed alarm set  Nurse Communication Mobility status   PT - Assessment/Plan  PT Plan Current plan remains appropriate  PT Visit Diagnosis Difficulty in walking, not elsewhere classified (R26.2);Muscle weakness (generalized) (M62.81)  PT Frequency (ACUTE ONLY) 7X/week  Follow Up Recommendations Follow surgeon's recommendation for DC plan and follow-up therapies;Supervision for mobility/OOB  PT equipment Rolling walker with 5" wheels  AM-PAC PT "6 Clicks" Mobility Outcome Measure (Version 2)  Help needed turning from your back to your side while in a flat bed without using bedrails? 2  Help needed moving from lying on  your back to sitting on the side of a flat bed without using bedrails? 2  Help needed moving to and from a bed to a chair (including a wheelchair)? 2  Help needed standing up from a chair using your arms (e.g., wheelchair or bedside chair)? 2  Help needed to walk in hospital room? 2  Help needed climbing 3-5 steps with a railing?  1  6 Click Score 11  Consider Recommendation of Discharge To: CIR/SNF/LTACH  PT Goal Progression  Progress towards PT goals Progressing toward goals  Acute Rehab PT Goals  PT Goal Formulation With patient  Time For Goal Achievement 08/10/18  Potential to Achieve Goals Good  PT Time Calculation  PT Start Time (ACUTE ONLY) 1523  PT Stop Time (ACUTE ONLY) 1551  PT Time Calculation (min) (ACUTE ONLY) 28 min  PT General Charges  $$ ACUTE PT VISIT 1 Visit  PT Treatments  $Therapeutic Exercise 23-37 mins

## 2018-08-04 NOTE — Progress Notes (Signed)
     Subjective: 2 Days Post-Op Procedure(s) (LRB): TOTAL KNEE ARTHROPLASTY (Right)   Patient reports pain as mild/moderate.  Feels that her pain is some better than yesterday.  No reported events throughout the night.  Recognizes that she was progressing slowly with PT.  Plan for discharge probably tomorrow due to underlying medical co-morbidities, pain control and need for inpatient therapy to meet goal of being discharged home safely with family/caregiver.   Objective:   VITALS:   Vitals:   08/04/18 0134 08/04/18 0508  BP: (!) 132/50 (!) 131/42  Pulse: 81 77  Resp: 20 16  Temp: 97.7 F (36.5 C) 97.7 F (36.5 C)  SpO2: 98% 93%    Dorsiflexion/Plantar flexion intact Incision: dressing C/D/I No cellulitis present Compartment soft  LABS Recent Labs    08/02/18 1330  HGB 12.1  HCT 39.7  WBC 8.7  PLT 203    Recent Labs    08/02/18 1330 08/03/18 0707  CREATININE 1.74* 1.60*     Assessment/Plan: 2 Days Post-Op Procedure(s) (LRB): TOTAL KNEE ARTHROPLASTY (Right)  Up with therapy Discharge home when ready, probably tomorrow     [place] Pugh. Eitan Doubleday   PAC  08/04/2018, 8:51 AM

## 2018-08-04 NOTE — Progress Notes (Signed)
Hypoglycemic Event  CBG: 49  Treatment: followed hypoglycemic protocol- pt given 8oz regular cola  Symptoms: none  Follow-up CBG: 2300 CBG Result:51  Treatment: followed hypoglycemic protocol- pt given 8oz regular cola  Symptoms : none  Follow up CBG- 2318 CBG Result: 50  Treatment : followed hypoglycemic protocol- pt given 8oz regular cola  Symptoms: none  Follow up CBG 2338 CBG result: 82  Possible Reasons for Event: unknown; no insulin given by this RN or on previous shift per [facility]. Pt was given her regular blood glucose medication on previous shift.   Comments:  Pt remained asymptomatic throughout event. Pt stated she felt fine and did not show any signs or symptoms of hypoglycemia.    Julio Alm

## 2018-08-04 NOTE — Progress Notes (Signed)
Physical Therapy Treatment Patient Details Name: Catherine Rich MRN: [id number] DOB: 1928/12/05 Today's Date: 08/04/2018    History of Present Illness s/p  R TKA    PT Comments    Pt progressing, able to amb short distance today but continues to fatigue rapidly. mildly diaphoretic after amb 8'.  BP 135/50, SpO2=84%--incr to 92% after seated rest, HR 95. Continue PT POC  Follow Up Recommendations  Follow surgeon's recommendation for DC plan and follow-up therapies;Supervision for mobility/OOB     Equipment Recommendations  Rolling walker with 5" wheels    Recommendations for Other Services       Precautions / Restrictions Precautions Precautions: Knee;Fall Restrictions Weight Bearing Restrictions: No Other Position/Activity Restrictions: WBAT    Mobility  Bed Mobility               General bed mobility comments: pt OOB  Transfers Overall transfer level: Needs assistance Equipment used: Rolling walker (2 wheeled) Transfers: Sit to/from Stand Sit to Stand: Min assist;+2 safety/equipment;+2 physical assistance         General transfer comment: multi-modal cues for hand placement, technique, safety  Ambulation/Gait Ambulation/Gait assistance: Min assist;+2 physical assistance;+2 safety/equipment Gait Distance (Feet): 8 Feet Assistive device: Rolling walker (2 wheeled) Gait Pattern/deviations: Step-to pattern;Wide base of support     General Gait Details: cues for sequence, RW safety, trunk extension   Stairs             Wheelchair Mobility    Modified Rankin (Stroke Patients Only)       Balance   Sitting-balance support: Feet supported;Bilateral upper extremity supported Sitting balance-Leahy Scale: Fair       Standing balance-Leahy Scale: Poor Standing balance comment: heavily reliant on UEs and external assist                            Cognition Arousal/Alertness: Awake/alert Behavior During Therapy: WFL for tasks  assessed/performed Overall Cognitive Status: Within Functional Limits for tasks assessed                                        Exercises Total Joint Exercises Ankle Circles/Pumps: AROM;Both;10 reps Quad Sets: AROM;Right;10 reps;Strengthening;Both    General Comments        Pertinent Vitals/Pain Pain Assessment: 0-10 Pain Score: 4  Pain Location: right knee Pain Descriptors / Indicators: Guarding;Grimacing Pain Intervention(s): Limited activity within patient's tolerance;Monitored during session;Ice applied    Home Living                      Prior Function            PT Goals (current goals can now be found in the care plan section) Acute Rehab PT Goals Patient Stated Goal: home PT Goal Formulation: With patient Time For Goal Achievement: 08/10/18 Potential to Achieve Goals: Good Progress towards PT goals: Progressing toward goals    Frequency    7X/week      PT Plan Current plan remains appropriate    Co-evaluation              AM-PAC PT "6 Clicks" Mobility   Outcome Measure  Help needed turning from your back to your side while in a flat bed without using bedrails?: A Lot Help needed moving from lying on your back to sitting on the side of a flat  bed without using bedrails?: A Lot Help needed moving to and from a bed to a chair (including a wheelchair)?: A Lot Help needed standing up from a chair using your arms (e.g., wheelchair or bedside chair)?: A Lot Help needed to walk in hospital room?: A Lot Help needed climbing 3-5 steps with a railing? : Total 6 Click Score: 11    End of Session Equipment Utilized During Treatment: Gait belt Activity Tolerance: Patient tolerated treatment well;Patient limited by fatigue Patient left: in chair;with call bell/phone within reach;with chair alarm set Nurse Communication: Mobility status PT Visit Diagnosis: Difficulty in walking, not elsewhere classified (R26.2);Muscle weakness  (generalized) (M62.81)     Time: 2440-1027 PT Time Calculation (min) (ACUTE ONLY): 23 min  Charges:  $Gait Training: 23-37 mins                     Kenyon Ana, PT  Pager: [phone] Acute Rehab Dept [facility]): [phone]   08/04/2018    [facility] 08/04/2018, 11:01 AM

## 2018-08-05 ENCOUNTER — Encounter (HOSPITAL_COMMUNITY): Payer: Self-pay | Admitting: Specialist

## 2018-08-05 LAB — GLUCOSE, CAPILLARY
Glucose-Capillary: 25 mg/dL — CL (ref 70–99)
Glucose-Capillary: 30 mg/dL — CL (ref 70–99)
Glucose-Capillary: 61 mg/dL — ABNORMAL LOW (ref 70–99)
Glucose-Capillary: 85 mg/dL (ref 70–99)
Glucose-Capillary: 198 mg/dL — ABNORMAL HIGH (ref 70–99)
Glucose-Capillary: 231 mg/dL — ABNORMAL HIGH (ref 70–99)
Glucose-Capillary: 154 mg/dL — ABNORMAL HIGH (ref 70–99)

## 2018-08-05 LAB — BASIC METABOLIC PANEL
Anion gap: 11 (ref 5–15)
BUN: 37 mg/dL — ABNORMAL HIGH (ref 8–23)
CO2: 23 mmol/L (ref 22–32)
Calcium: 9.2 mg/dL (ref 8.9–10.3)
Chloride: 93 mmol/L — ABNORMAL LOW (ref 98–111)
Creatinine, Ser: 2.27 mg/dL — ABNORMAL HIGH (ref 0.44–1.00)
GFR calc Af Amer: 22 mL/min — ABNORMAL LOW (ref >60)
GFR calc non Af Amer: 19 mL/min — ABNORMAL LOW (ref >60)
Glucose, Bld: 59 mg/dL — ABNORMAL LOW (ref 70–99)
Potassium: 5.4 mmol/L — ABNORMAL HIGH (ref 3.5–5.1)
Sodium: 127 mmol/L — ABNORMAL LOW (ref 135–145)

## 2018-08-05 LAB — CBC
HCT: 31 % — ABNORMAL LOW (ref 36.0–46.0)
Hemoglobin: 9.9 g/dL — ABNORMAL LOW (ref 12.0–15.0)
MCH: 28.6 pg (ref 26.0–34.0)
MCHC: 31.9 g/dL (ref 30.0–36.0)
MCV: 89.6 fL (ref 80.0–100.0)
Platelets: 205 10*3/uL (ref 150–400)
RBC: 3.46 MIL/uL — ABNORMAL LOW (ref 3.87–5.11)
RDW: 15 % (ref 11.5–15.5)
WBC: 17.1 10*3/uL — ABNORMAL HIGH (ref 4.0–10.5)
nRBC: 0 % (ref 0.0–0.2)

## 2018-08-05 MED ORDER — GLUCOSE 40 % PO GEL
ORAL | Status: AC
  Administered 2018-08-05: 08:00:00
  Filled 2018-08-05: qty 1

## 2018-08-05 MED ORDER — METOCLOPRAMIDE HCL 5 MG/ML IJ SOLN
10.0000 mg | Freq: Four times a day (QID) | INTRAMUSCULAR | Status: DC
  Administered 2018-08-05 – 2018-08-06 (×3): 10 mg via INTRAVENOUS
  Filled 2018-08-05 (×3): qty 2

## 2018-08-05 MED ORDER — GLUCOSE 40 % PO GEL
ORAL | Status: AC
  Administered 2018-08-05: 37.5 g
  Filled 2018-08-05: qty 1

## 2018-08-05 NOTE — Progress Notes (Signed)
     Subjective: 3 Days Post-Op Procedure(s) (LRB): TOTAL KNEE ARTHROPLASTY (Right)   Patient reports pain as mild, pain controlled. No events throughout the night. Hypoglycemia this morning. Progressing slowly with PT, but request for a knee immobilizer this morning to help with stability with walking.      Objective:   VITALS:    08/05/18 0549  BP: (!) 127/34  Pulse: 90  Resp: 18  Temp: 97.9 F (36.6 C)  SpO2: 99%    Dorsiflexion/Plantar flexion intact Incision: dressing C/D/I No cellulitis present Compartment soft  LABS Recent Labs    08/02/18 1330  HGB 12.1  HCT 39.7  WBC 8.7  PLT 203    Recent Labs    08/02/18 1330 08/03/18 0707  CREATININE 1.74* 1.60*     Assessment/Plan: 3 Days Post-Op Procedure(s) (LRB): TOTAL KNEE ARTHROPLASTY (Right) CBC and BMP ordered Up with therapy Discharge home if she progresses safety with PT and labs are good Discussed following up with her PCP regarding her blood sugar Follow up in 2 weeks at [facility]. Follow up with Dr. Theda Sers in 2 weeks.  Contact information:  [facility]  [address] [place] Colvin   PAC  08/05/2018, 8:56 AM

## 2018-08-05 NOTE — Progress Notes (Signed)
Physical Therapy Treatment Patient Details Name: Catherine Rich MRN: [id number] DOB: 1928/08/09 Today's Date: 08/05/2018    History of Present Illness s/p  R TKA    PT Comments    POD 3 am session Pt not progressing as well as expected.  Still requires + 2 assist and have yet to practice the 4 steps she has to enter home. General bed mobility comments: Min Assist with R LE but pt was unable to "sit self up" and required Mod Assist with upper body.  Great difficulty self scooting and required Max Assist with bed pad to complete scooting to EOB. General transfer comment: pt was unable to self rise even from elevated bed.  Pt kept sliding forward nearly off the bed and unaware.  Called RN to room to assist.  Pt required + 2 side by side assist to rise present with poor standing balance and inability to achieve upright posture.  Near fall when attempting 1/4 pivot to [facility] with incomplete turn and inability to weightshift and side step completely even with KI on R LE.  Due to L LE weakness as well, pt had difficulty weight shifting. General Gait Details: pt was unable to weight shift and unable to support self to progress gait past a few attempted steps.  KI was applied to R LE for increased support however L knee was buckling.  Recliner brought to pt from behind. Consulted LPT pt's current mobility level and + 2 assist need.  Pt not progressing enough to safely D/C to home.   Follow Up Recommendations  SNF     Equipment Recommendations  Rolling walker with 5" wheels    Recommendations for Other Services       Precautions / Restrictions Precautions Precautions: Knee;Fall Precaution Comments: pt now has a KI for R LE for OOB/amb increased support but other knee buckles as well Required Braces or Orthoses: Knee Immobilizer - Right Restrictions Weight Bearing Restrictions: No Other Position/Activity Restrictions: WBAT    Mobility  Bed Mobility Overal bed mobility: Needs  Assistance Bed Mobility: Sidelying to Sit     Supine to sit: Mod assist;Max assist     General bed mobility comments: Min Assist with R LE but pt was unable to "sit self up" and required Mod Assist with upper body.  Great difficulty self scooting and required Max Assist with bed pad to complete scooting to EOB.  Transfers Overall transfer level: Needs assistance Equipment used: Rolling walker (2 wheeled) Transfers: Sit to/from Stand Sit to Stand: Mod assist;+2 physical assistance;+2 safety/equipment Stand pivot transfers: Max assist;+2 physical assistance;+2 safety/equipment       General transfer comment: pt was unable to self rise even from elevated bed.  Pt kept sliding forward nearly off the bed and unaware.  Called RN to room to assist.  Pt required + 2 side by side assist to rise present with poor standing balance and inability to achieve upright posture.  Near fall when attempting 1/4 pivot to [facility] with incomplete turn and inability to weightshift and side step completely even with KI on R LE.  Due to L LE weakness as well, pt had difficulty weight shifting.  Ambulation/Gait Ambulation/Gait assistance: Max assist;+2 physical assistance;+2 safety/equipment Gait Distance (Feet): 1 Feet Assistive device: Rolling walker (2 wheeled) Gait Pattern/deviations: Step-to pattern;Wide base of support     General Gait Details: pt was unable to weight shift and unable to support self to progress gait past a few attempted steps.  KI was applied to  R LE for increased support however L knee was buckling.  Recliner brought to pt from behind.   Stairs             Wheelchair Mobility    Modified Rankin (Stroke Patients Only)       Balance                                            Cognition Arousal/Alertness: Awake/alert Behavior During Therapy: WFL for tasks assessed/performed                                   General Comments: slightly  "foggy" with impaired safety cognition during bed to [facility] transfer      Exercises      General Comments        Pertinent Vitals/Pain Pain Assessment: Faces Faces Pain Scale: Hurts a little bit Pain Location: right knee Pain Descriptors / Indicators: Guarding;Grimacing Pain Intervention(s): Monitored during session;Repositioned;Ice applied    Home Living                      Prior Function            PT Goals (current goals can now be found in the care plan section) Progress towards PT goals: Progressing toward goals(slowly)    Frequency    7X/week      PT Plan Discharge plan needs to be updated    Co-evaluation              AM-PAC PT "6 Clicks" Mobility   Outcome Measure  Help needed turning from your back to your side while in a flat bed without using bedrails?: A Lot Help needed moving from lying on your back to sitting on the side of a flat bed without using bedrails?: A Lot Help needed moving to and from a bed to a chair (including a wheelchair)?: A Lot Help needed standing up from a chair using your arms (e.g., wheelchair or bedside chair)?: A Lot Help needed to walk in hospital room?: A Lot Help needed climbing 3-5 steps with a railing? : Total 6 Click Score: 11    End of Session Equipment Utilized During Treatment: Gait belt Activity Tolerance: Patient limited by fatigue Patient left: in chair;with call bell/phone within reach;with chair alarm set Nurse Communication: Mobility status(RN in room during session to observe/assist) PT Visit Diagnosis: Difficulty in walking, not elsewhere classified (R26.2);Muscle weakness (generalized) (M62.81)     Time: 2423-5361 PT Time Calculation (min) (ACUTE ONLY): 28 min  Charges:  $Gait Training: 8-22 mins $Therapeutic Activity: 8-22 mins                     Rica Koyanagi  PTA Acute  Rehabilitation Services Pager      [phone] Office      [phone]

## 2018-08-05 NOTE — Plan of Care (Signed)

## 2018-08-05 NOTE — Care Management Important Message (Signed)
Important Message  Patient Details IM Letter given to Velva Harman RN to present to the Patient Name: BOWIE DOIRON MRN: [id number] Date of Birth: 1928-07-12   Medicare Important Message Given:  Yes     Kerin Salen 08/05/2018, 2:22 PM

## 2018-08-05 NOTE — Progress Notes (Addendum)
Blood sugar this am was 25, gave patient a coke and glucose gel, rechecked 15 min later 30, gave a second glucose gel and some applesauce, her breakfast, and an ensure and it finally came up to 85. PA aware of all this, and d/c'd her glipizide.

## 2018-08-05 NOTE — Progress Notes (Signed)
Physical Therapy Treatment Patient Details Name: Catherine Rich MRN: [id number] DOB: 11/03/1928 Today's Date: 08/05/2018    History of Present Illness s/p  R TKA    PT Comments    POD # 3 pm session.   General transfer comment: attempted to perform sit to stand from recliner + 1 assist however pt was unable to rise despite 3 attempts.  NT assist getting pt from recliner to bed.  Pt had great difficulty weight shifting onto R LE and c/o "other" leg weakness.  General Gait Details: only a few backward pivot steps to bed + 2 Max Assist.  General bed mobility comments: Max Assist back to bed positioned for TE's.  Performed some TE's followed by ICE.   Follow Up Recommendations  SNF     Equipment Recommendations       Recommendations for Other Services       Precautions / Restrictions Precautions Precautions: Knee;Fall Precaution Comments: pt now has a KI for R LE for OOB/amb increased support but other knee buckles as well Required Braces or Orthoses: Knee Immobilizer - Right Restrictions Weight Bearing Restrictions: No Other Position/Activity Restrictions: WBAT    Mobility  Bed Mobility Overal bed mobility: Needs Assistance Bed Mobility: Sit to Supine     Supine to sit: Max assist     General bed mobility comments: Max Assist back to bed positioned for TE's  Transfers Overall transfer level: Needs assistance Equipment used: Rolling walker (2 wheeled) Transfers: Sit to/from Stand Sit to Stand: Mod assist;+2 physical assistance;+2 safety/equipment Stand pivot transfers: Max assist;+2 physical assistance;+2 safety/equipment       General transfer comment: attempted to perform sit to stand from recliner + 1 assist however pt was unable to rise despite 3 attempts.  NT assist getting pt from recliner to bed.  Pt had great difficulty weight shifting onto R LE and c/o "other" leg weakness.  Ambulation/Gait             General Gait Details: only a few backward  pivot steps to bed + 2 Max Assist   Stairs             Wheelchair Mobility    Modified Rankin (Stroke Patients Only)       Balance                                            Cognition Arousal/Alertness: Awake/alert Behavior During Therapy: WFL for tasks assessed/performed Overall Cognitive Status: Within Functional Limits for tasks assessed                                 General Comments: tired      Exercises   Total Knee Replacement TE's 10 reps B LE ankle pumps 10 reps towel squeezes 10 reps knee presses 10 reps heel slides  10 reps SAQ's 10 reps SLR's 10 reps ABD Followed by ICE     General Comments        Pertinent Vitals/Pain      Home Living                      Prior Function            PT Goals (current goals can now be found in the care plan section) Progress towards PT goals:  Progressing toward goals    Frequency    7X/week      PT Plan Discharge plan needs to be updated    Co-evaluation              AM-PAC PT "6 Clicks" Mobility   Outcome Measure  Help needed turning from your back to your side while in a flat bed without using bedrails?: A Lot Help needed moving from lying on your back to sitting on the side of a flat bed without using bedrails?: A Lot Help needed moving to and from a bed to a chair (including a wheelchair)?: A Lot Help needed standing up from a chair using your arms (e.g., wheelchair or bedside chair)?: A Lot Help needed to walk in hospital room?: A Lot Help needed climbing 3-5 steps with a railing? : Total 6 Click Score: 11    End of Session Equipment Utilized During Treatment: Gait belt Activity Tolerance: Patient limited by fatigue Patient left: in bed;with call bell/phone within reach;with bed alarm set Nurse Communication: Mobility status PT Visit Diagnosis: Difficulty in walking, not elsewhere classified (R26.2);Muscle weakness (generalized)  (M62.81)     Time: 1405-1430 PT Time Calculation (min) (ACUTE ONLY): 25 min  Charges:  $Therapeutic Exercise: 8-22 mins $Therapeutic Activity: 8-22 mins                     Rica Koyanagi  PTA Acute  Rehabilitation Services Pager      [phone] Office      [phone]

## 2018-08-05 NOTE — Discharge Summary (Addendum)
Patient ID: TAMEY WANEK MRN: [id number] DOB/AGE: Mar 17, 1928 85 y.o.  Admit date: 08/02/2018 Discharge date: 08/06/2018  Admission Diagnoses:  Active Problems:   Osteoarthritis of right knee   S/P TKR (total knee replacement) using cement, right   Discharge Diagnoses:  Same  Past Medical History:  Diagnosis Date  . Anemia   . Arthritis   . Chronic kidney disease    only one funtioning kidney  . Complication of anesthesia   . Diabetes mellitus without complication ([place])   . GERD (gastroesophageal reflux disease)   . Hypertension   . Hypothyroidism   . Macular degeneration, bilateral   . PONV (postoperative nausea and vomiting)   . Stroke [facility]) 01/19/2011    Surgeries: Procedure(s): TOTAL KNEE ARTHROPLASTY on 08/02/2018   Consultants:   Discharged Condition: Improved  Hospital Course: DANNICA BICKHAM is an 85 y.o. female who was admitted 08/02/2018 for operative treatment of right knee primary OA / pain. Patient has severe unremitting pain that affects sleep, daily activities, and work/hobbies. After pre-op clearance the patient was taken to the operating room on 08/02/2018 and underwent  Procedure(s): TOTAL KNEE ARTHROPLASTY.    Patient was given perioperative antibiotics:  Anti-infectives (From admission, onward)   Start     Dose/Rate Route Frequency Ordered Stop   08/02/18 1800  vancomycin (VANCOCIN) IVPB 1000 mg/200 mL premix  Status:  Discontinued     1,000 mg 200 mL/hr over 60 Minutes Intravenous Every 12 hours 08/02/18 1249 08/02/18 1328   08/02/18 1800  vancomycin (VANCOCIN) 1,000 mg in sodium chloride 0.9 % 500 mL IVPB  Status:  Discontinued     1,000 mg 250 mL/hr over 120 Minutes Intravenous  Once 08/02/18 1328 08/02/18 1335   08/02/18 1800  vancomycin (VANCOCIN) 1,000 mg in sodium chloride 0.9 % 250 mL IVPB     1,000 mg 250 mL/hr over 60 Minutes Intravenous  Once 08/02/18 1335 08/02/18 1931   08/02/18 0600  vancomycin (VANCOCIN) IVPB 1000 mg/200 mL  premix     1,000 mg 200 mL/hr over 60 Minutes Intravenous 60 min pre-op 08/02/18 0518 08/02/18 0735       Patient was given sequential compression devices, early ambulation, and chemoprophylaxis to prevent DVT.  Patient benefited maximally from hospital stay and there were no complications.    Recent vital signs:  Patient Vitals for the past 24 hrs:  BP Temp Temp src Pulse Resp SpO2  08/06/18 0559 (!) 111/52 98.1 F (36.7 C) Oral 85 17 94 %  08/05/18 2208 126/66 99.5 F (37.5 C) Oral (!) 103 19 (!) 89 %  08/05/18 1739 (!) 128/42 - - - - -  08/05/18 1315 (!) 121/49 97.9 F (36.6 C) - 89 16 91 %  08/05/18 1231 - - - - - 91 %     Recent laboratory studies:  Recent Labs    08/05/18 0854  WBC 17.1*  HGB 9.9*  HCT 31.0*  PLT 205  NA 127*  K 5.4*  CL 93*  CO2 23  BUN 37*  CREATININE 2.27*  GLUCOSE 59*  CALCIUM 9.2     Discharge Medications:   Allergies as of 08/06/2018      Reactions   Penicillins Anaphylaxis, Rash   Did it involve swelling of the face/tongue/throat, SOB, or low BP? Yes Did it involve sudden or severe rash/hives, skin peeling, or any reaction on the inside of your mouth or nose? Yes Did you need to seek medical attention at a hospital or doctor's office?  Yes When did it last happen?36 YEARS AGO , IN HER 30S If all above answers are "NO", may proceed with cephalosporin use.   Betadine [povidone Iodine] Other (See Comments)   IRRITATION AROUND EYES WHEN GETTING EYE INJECTIONS    Eggs Or Egg-derived Products Diarrhea, Nausea And Vomiting   Other    icecream-diarrhea   Codeine Nausea And Vomiting   Sulfa Antibiotics Nausea And Vomiting      Medication List    STOP taking these medications   glipiZIDE 10 MG 24 hr tablet Commonly known as: GLUCOTROL XL  RESTART ONCE BLOOD GLUCOSE LEVEL STABLE   irbesartan 300 MG tablet Commonly known as: AVAPRO   traMADol 50 MG tablet Commonly known as: ULTRAM     TAKE these medications   ALPRAZolam  0.5 MG tablet Commonly known as: XANAX Take 1 mg by mouth at bedtime.   amLODipine 10 MG tablet Commonly known as: NORVASC Take 10 mg by mouth daily.   aspirin EC 81 MG tablet Take 4 tablets (325 mg total) by mouth 2 (two) times daily for 30 days. What changed:   how much to take  when to take this   atorvastatin 40 MG tablet Commonly known as: LIPITOR Take 40 mg by mouth daily.   cloNIDine 0.1 MG tablet Commonly known as: CATAPRES Take 0.1 mg by mouth every evening.   gabapentin 300 MG capsule Commonly known as: NEURONTIN Take 300-600 mg by mouth at bedtime. DEPENDING ON PAIN , PT MAY TAKE 300 MG OR 600 MG AT BEDTIME   ketotifen 0.025 % ophthalmic solution Commonly known as: ZADITOR Place 1 drop into both eyes 2 (two) times daily as needed.   levothyroxine 112 MCG tablet Commonly known as: SYNTHROID Take 112 mcg by mouth daily before breakfast.   loratadine 10 MG tablet Commonly known as: CLARITIN Take 10 mg by mouth daily.   Lutein-Zeaxanthin 25-5 MG Caps Take 1 capsule by mouth daily.   methocarbamol 500 MG tablet Commonly known as: Robaxin Take 1 tablet (500 mg total) by mouth 4 (four) times daily for 14 days.   multivitamin with minerals tablet Take 1 tablet by mouth daily.   neomycin-polymyxin-dexameth 0.1 % Oint Commonly known as: MAXITROL Place 1 application into both eyes 3 (three) times daily.   ofloxacin 0.3 % ophthalmic solution Commonly known as: OCUFLOX Place 1 drop into both eyes See admin instructions. Pt to instill 1 drop in operative eye three times daily  2 days before and 3 days after eye injection   oxyCODONE-acetaminophen 10-325 MG tablet Commonly known as: Percocet Take 1 tablet by mouth every 6 (six) hours as needed for up to 7 days for pain.   pantoprazole 20 MG tablet Commonly known as: PROTONIX Take 20 mg by mouth daily.   prednisoLONE acetate 1 % ophthalmic suspension Commonly known as: PRED FORTE Place 1 drop into both  eyes See admin instructions. Place one drop in both eyes four times daily for two weeks, then place one drop in both eyes twice daily for 4 weeks.   vitamin B-12 1000 MCG tablet Commonly known as: CYANOCOBALAMIN Take 1,000 mcg by mouth daily.   vitamin C 500 MG tablet Commonly known as: ASCORBIC ACID Take 500 mg by mouth daily.   Vitamin D 50 MCG (2000 UT) tablet Take 2,000 Units by mouth daily.            Discharge Care Instructions  (From admission, onward)         Start  Ordered   08/05/18 0000  Change dressing    Comments: Maintain surgical dressing until follow up in the clinic. If the edges start to pull up, may reinforce with tape. If the dressing is no longer working, may remove and cover with gauze and tape, but must keep the area dry and clean.  Call with any questions or concerns.   08/05/18 0925          Diagnostic Studies: No results found.  Disposition: [place]       Discharge Instructions    Call MD / Call 911   Complete by: As directed    If you experience chest pain or shortness of breath, CALL 911 and be transported to the hospital emergency room.  If you develope a fever above 101 F, pus (white drainage) or increased drainage or redness at the wound, or calf pain, call your surgeon's office.   Change dressing   Complete by: As directed    Maintain surgical dressing until follow up in the clinic. If the edges start to pull up, may reinforce with tape. If the dressing is no longer working, may remove and cover with gauze and tape, but must keep the area dry and clean.  Call with any questions or concerns.   Constipation Prevention   Complete by: As directed    Drink plenty of fluids.  Prune juice may be helpful.  You may use a stool softener, such as Colace (over the counter) 100 mg twice a day.  Use MiraLax (over the counter) for constipation as needed.   Diet - low sodium heart healthy   Complete by: As directed    Discharge  instructions   Complete by: As directed    Maintain surgical dressing until follow up in the clinic. If the edges start to pull up, may reinforce with tape. If the dressing is no longer working, may remove and cover with gauze and tape, but must keep the area dry and clean.  Follow up in 2 weeks at [facility]. Call with any questions or concerns.   Increase activity slowly as tolerated   Complete by: As directed    Weight bearing as tolerated with assist device (walker, cane, etc) as directed, use it as long as suggested by your surgeon or therapist, typically at least 4-6 weeks.   TED hose   Complete by: As directed    Use stockings (TED hose) for 2 weeks on both leg(s).  You may remove them at night for sleeping.      Follow-up Information    [facility].   Why: agency will provide home health physical therapy. agency will call you to schedule initial visit Contact information: [phone]       Sydnee Cabal, MD. Schedule an appointment as soon as possible for a visit in 2 week(s).   Specialty: Orthopedic Surgery Contact information: [address] [place] [address] [phone]            Signed: Lucille Passy [facility] 08/06/2018, 11:09 AM

## 2018-08-05 NOTE — TOC Progression Note (Signed)
Transition of Care [facility]) - Progression Note    Patient Details  Name: SHANDREKA DANTE MRN: [id number] Date of Birth: May 22, 1928  [facility] CM/SW Contact  Leeroy Cha, RN Phone Number: 08/05/2018, 2:01 PM  Clinical Narrative:    1300/ placed call to the daughter Junie Spencer per the patient request/ Ms. Black wants to speak to the md first and may want patient to go to [facility] or [facility].  She will call back.   Expected Discharge Plan: Home/Self Care Barriers to Discharge: Barriers Resolved  Expected Discharge Plan and Services Expected Discharge Plan: Home/Self Care   Discharge Planning Services: CM Consult Post Acute Care Choice: [place] arrangements for the past 2 months: Single Family Home Expected Discharge Date: 08/05/18               DME Arranged: N/A DME Agency: NA       HH Arranged: PT [place] Agency: [place] ([facility]) Date [place]: 08/03/18 Time [place]: 1018 Representative spoke with at [place]: [place] ([place]) Interventions    Readmission Risk Interventions No flowsheet data found.

## 2018-08-05 NOTE — NC FL2 (Signed)
[place] LEVEL OF CARE SCREENING TOOL     IDENTIFICATION  Patient Name: Catherine Rich Birthdate: May 17, 1928 Sex: female Admission Date (Current Location): 08/02/2018  [facility] and [place] Number:  Herbalist and Address:  [facility],  [place], [place]      Provider Number: [id number]  Attending Physician Name and Address:  Sydnee Cabal, MD  Relative Name and Phone Number:  Junie Spencer [phone]    Current Level of Care: SNF Recommended Level of Care: [place] Prior Approval Number:    Date Approved/Denied: 08/05/18 PASRR Number: [id number]  Discharge Plan: Home    Current Diagnoses: Patient Active Problem List   Diagnosis Date Noted  . Osteoarthritis of right knee 08/02/2018  . S/P TKR (total knee replacement) using cement, right 08/02/2018    Orientation RESPIRATION BLADDER Height & Weight     Self, Time, Situation, Place  Normal Continent Weight: 77.1 kg Height:  5\' 3"  (160 cm)  BEHAVIORAL SYMPTOMS/MOOD NEUROLOGICAL BOWEL NUTRITION STATUS      Continent Diet(Regular)  AMBULATORY STATUS COMMUNICATION OF NEEDS Skin   Extensive Assist(5 times weekly) Verbally Normal                       Personal Care Assistance Level of Assistance  Bathing, Feeding, Dressing Bathing Assistance: Limited assistance Feeding assistance: Limited assistance Dressing Assistance: Limited assistance     Functional Limitations Info             SPECIAL CARE FACTORS FREQUENCY                       Contractures Contractures Info: Not present    Additional Factors Info  Code Status Code Status Info: Full             Current Medications (08/05/2018):  This is the current hospital active medication list Current Facility-Administered Medications  Medication Dose Route Frequency Provider Last Rate Last Dose  . 0.45 % sodium chloride infusion   Intravenous Continuous Sydnee Cabal,  MD   Stopped at 08/03/18 1453  . acetaminophen (TYLENOL) tablet 325-650 mg  325-650 mg Oral Q6H PRN Brynda Peon, PA   650 mg at 08/05/18 0554  . ALPRAZolam Duanne Moron) tablet 1 mg  1 mg Oral QHS Gertie Fey [place], [place]   1 mg at 08/03/18 2303  . amLODipine (NORVASC) tablet 10 mg  10 mg Oral Daily Gertie Fey [place], [place]   10 mg at 08/05/18 6301  . atorvastatin (LIPITOR) tablet 40 mg  40 mg Oral Daily Gertie Fey [place], [place]   40 mg at 08/05/18 6010  . bisacodyl (DULCOLAX) EC tablet 5 mg  5 mg Oral Daily PRN Brynda Peon, PA      . cholecalciferol (VITAMIN D3) tablet 2,000 Units  2,000 Units Oral Daily Sydnee Cabal, MD   2,000 Units at 08/05/18 [phone]  . cloNIDine (CATAPRES) tablet 0.1 mg  0.1 mg Oral QPM Gertie Fey [place], PA   0.1 mg at 08/04/18 1648  . dextrose (GLUTOSE) 40 % oral gel           . docusate sodium (COLACE) capsule 100 mg  100 mg Oral BID Gertie Fey [place], PA   100 mg at 08/05/18 5573  . enoxaparin (LOVENOX) injection 30 mg  30 mg Subcutaneous Q24H Ward, Yvonne Kendall, PA-C   30 mg at 08/05/18 0831  . gabapentin (NEURONTIN) capsule 300-600 mg  300-600  mg Oral QHS Gertie Fey [place], [place]   300 mg at 08/04/18 2159  . HYDROmorphone (DILAUDID) injection 0.5-1 mg  0.5-1 mg Intravenous Q4H PRN Brynda Peon, PA   1 mg at 08/02/18 2311  . insulin aspart (novoLOG) injection 0-15 Units  0-15 Units Subcutaneous TID WC Gertie Fey [place], PA   3 Units at 08/04/18 1157  . insulin aspart (novoLOG) injection 0-5 Units  0-5 Units Subcutaneous QHS Gertie Fey [place], [place]      . irbesartan (AVAPRO) tablet 300 mg  300 mg Oral Daily Gertie Fey [place], [place]   300 mg at 08/05/18 6712  . ketotifen (ZADITOR) 0.025 % ophthalmic solution 1 drop  1 drop Both Eyes BID PRN Gertie Fey [place], PA      . levothyroxine (SYNTHROID) tablet 112 mcg  112 mcg Oral QAC breakfast Gertie Fey [place], [place]   112 mcg at 08/05/18 0554  . loratadine  (CLARITIN) tablet 10 mg  10 mg Oral Daily Gertie Fey [place], [place]   10 mg at 08/05/18 4580  . menthol-cetylpyridinium (CEPACOL) lozenge 3 mg  1 lozenge Oral PRN Brynda Peon, PA       Or  . phenol (CHLORASEPTIC) mouth spray 1 spray  1 spray Mouth/Throat PRN Brynda Peon, PA      . methocarbamol (ROBAXIN) tablet 500 mg  500 mg Oral Q6H PRN Brynda Peon, PA   500 mg at 08/03/18 9983   Or  . methocarbamol (ROBAXIN) 500 mg in dextrose 5 % 50 mL IVPB  500 mg Intravenous Q6H PRN Gertie Fey [place], PA      . metoCLOPramide (REGLAN) tablet 5-10 mg  5-10 mg Oral Q8H PRN Brynda Peon, PA       Or  . metoCLOPramide (REGLAN) injection 5-10 mg  5-10 mg Intravenous Q8H PRN Brynda Peon, PA      . multivitamin with minerals tablet 1 tablet  1 tablet Oral Daily Sydnee Cabal, MD   1 tablet at 08/05/18 [phone]  . neomycin-polymyxin-dexameth (MAXITROL) 0.1 % ophth ointment 1 application  1 application Both Eyes TID Brynda Peon, [place]   1 application at 05/39/76 [phone]  . ondansetron (ZOFRAN) tablet 4 mg  4 mg Oral Q6H PRN Brynda Peon, PA   4 mg at 08/05/18 0554   Or  . ondansetron (ZOFRAN) injection 4 mg  4 mg Intravenous Q6H PRN Brynda Peon, PA   4 mg at 08/05/18 1228  . oxyCODONE (Oxy IR/ROXICODONE) immediate release tablet 10-15 mg  10-15 mg Oral Q4H PRN Gertie Fey [place], PA   15 mg at 08/03/18 1041  . oxyCODONE (Oxy IR/ROXICODONE) immediate release tablet 5-10 mg  5-10 mg Oral Q4H PRN Brynda Peon, PA   5 mg at 08/05/18 9379  . pantoprazole (PROTONIX) EC tablet 20 mg  20 mg Oral Daily Gertie Fey [place], [place]   20 mg at 08/05/18 0850  . polyethylene glycol (MIRALAX / GLYCOLAX) packet 17 g  17 g Oral Daily PRN Brynda Peon, PA   17 g at 08/05/18 0831  . prednisoLONE acetate (PRED FORTE) 1 % ophthalmic suspension 1 drop  1 drop Both Eyes QID Gertie Fey [place], [place]   1 drop at  08/05/18 [phone]  . sodium phosphate (FLEET) 7-19 GM/118ML enema 1 enema  1 enema Rectal Once PRN Gertie Fey [place], PA      . vitamin B-12 (CYANOCOBALAMIN) tablet 1,000 mcg  1,000 mcg Oral Daily Gertie Fey [place], [place]  1,000 mcg at 08/05/18 0833  . vitamin C (ASCORBIC ACID) tablet 500 mg  500 mg Oral Daily Gertie Fey [place], [place]   500 mg at 08/05/18 1610  . zolpidem (AMBIEN) tablet 5 mg  5 mg Oral QHS PRN Brynda Peon, PA         Discharge Medications: Please see discharge summary for a list of discharge medications.  Relevant Imaging Results:  Relevant Lab Results:   Additional Information [id number]  Leeroy Cha, RN

## 2018-08-05 NOTE — Discharge Summary (Signed)
We spoke with patients daughter and they wish to go to [facility] in [place] I called her nurse and expressed my,our request. She is to contact [facility].    Contact me at [phone] for further discussions.

## 2018-08-05 NOTE — TOC Transition Note (Signed)
Transition of Care [facility]) - CM/SW Discharge Note   Patient Details  Name: Catherine Rich MRN: [id number] Date of Birth: 06/23/1928  [facility] of [facility]) CM/SW Contact:  Leeroy Cha, RN Phone Number: 08/05/2018, 10:05 AM   Clinical Narrative:    dcd to home. Self care   Final next level of care: Home/Self Care Barriers to Discharge: Barriers Resolved   Patient Goals and CMS Choice Patient states their goals for this hospitalization and ongoing recovery are:: to go home CMS Medicare.gov Compare Post Acute Care list provided to:: Patient Choice offered to / list presented to : Patient  Discharge Placement                       Discharge Plan and Services   Discharge Planning Services: CM Consult Post Acute Care Choice: Home Health          DME Arranged: N/A DME Agency: NA       HH Arranged: PT [place] Agency: [place] ([facility]) Date HH Agency Contacted: 08/03/18 Time [place]: 7846 Representative spoke with at [place]: [place] ([place]) Interventions     Readmission Risk Interventions No flowsheet data found.

## 2018-08-06 DIAGNOSIS — R2689 Other abnormalities of gait and mobility: Secondary | ICD-10-CM | POA: Diagnosis not present

## 2018-08-06 DIAGNOSIS — M6281 Muscle weakness (generalized): Secondary | ICD-10-CM | POA: Diagnosis not present

## 2018-08-06 DIAGNOSIS — I959 Hypotension, unspecified: Secondary | ICD-10-CM | POA: Diagnosis not present

## 2018-08-06 DIAGNOSIS — R0902 Hypoxemia: Secondary | ICD-10-CM | POA: Diagnosis not present

## 2018-08-06 DIAGNOSIS — E1122 Type 2 diabetes mellitus with diabetic chronic kidney disease: Secondary | ICD-10-CM | POA: Diagnosis not present

## 2018-08-06 DIAGNOSIS — E039 Hypothyroidism, unspecified: Secondary | ICD-10-CM | POA: Diagnosis not present

## 2018-08-06 DIAGNOSIS — K219 Gastro-esophageal reflux disease without esophagitis: Secondary | ICD-10-CM | POA: Diagnosis not present

## 2018-08-06 DIAGNOSIS — M2550 Pain in unspecified joint: Secondary | ICD-10-CM | POA: Diagnosis not present

## 2018-08-06 DIAGNOSIS — Z96651 Presence of right artificial knee joint: Secondary | ICD-10-CM | POA: Diagnosis not present

## 2018-08-06 DIAGNOSIS — F411 Generalized anxiety disorder: Secondary | ICD-10-CM | POA: Diagnosis not present

## 2018-08-06 DIAGNOSIS — Z7401 Bed confinement status: Secondary | ICD-10-CM | POA: Diagnosis not present

## 2018-08-06 DIAGNOSIS — N189 Chronic kidney disease, unspecified: Secondary | ICD-10-CM | POA: Diagnosis not present

## 2018-08-06 DIAGNOSIS — I10 Essential (primary) hypertension: Secondary | ICD-10-CM | POA: Diagnosis not present

## 2018-08-06 DIAGNOSIS — E1140 Type 2 diabetes mellitus with diabetic neuropathy, unspecified: Secondary | ICD-10-CM | POA: Diagnosis not present

## 2018-08-06 DIAGNOSIS — Z471 Aftercare following joint replacement surgery: Secondary | ICD-10-CM | POA: Diagnosis not present

## 2018-08-06 DIAGNOSIS — R41841 Cognitive communication deficit: Secondary | ICD-10-CM | POA: Diagnosis not present

## 2018-08-06 LAB — GLUCOSE, CAPILLARY
Glucose-Capillary: 116 mg/dL — ABNORMAL HIGH (ref 70–99)
Glucose-Capillary: 115 mg/dL — ABNORMAL HIGH (ref 70–99)
Glucose-Capillary: 149 mg/dL — ABNORMAL HIGH (ref 70–99)

## 2018-08-06 NOTE — Progress Notes (Signed)
     Subjective: 4 Days Post-Op Procedure(s) (LRB): TOTAL KNEE ARTHROPLASTY (Right)   Patient reports pain as mild, pain controlled. Resting comfortably in bed.   No events throughout the night.  Had some urinary retention this morning. We discussed working on urinating today.  Also to work with PT.  Plan on d/c to SNF later today if she is doing well.    Objective:   VITALS:   Vitals:   08/05/18 2208 08/06/18 0559  BP: 126/66 (!) 111/52  Pulse: (!) 103 85  Resp: 19 17  Temp: 99.5 F (37.5 C) 98.1 F (36.7 C)  SpO2: (!) 89% 94%    Dorsiflexion/Plantar flexion intact Incision: dressing C/D/I No cellulitis present Compartment soft  LABS Recent Labs    08/05/18 0854  HGB 9.9*  HCT 31.0*  WBC 17.1*  PLT 205    Recent Labs    08/05/18 0854  NA 127*  K 5.4*  BUN 37*  CREATININE 2.27*  GLUCOSE 59*     Assessment/Plan: 4 Days Post-Op Procedure(s) (LRB): TOTAL KNEE ARTHROPLASTY (Right) Discussed working on urinating this AM Up with therapy Discharge to SNF when ready Follow up in 2 weeks at [facility] ([place]). Follow up with Dr. Theda Sers in 2 weeks.  Contact information:  [facility]  [address], [address] [place] Catherine Rich   PAC  08/06/2018, 8:05 AM

## 2018-08-06 NOTE — Progress Notes (Signed)
Patient discharged to facility via [place]. All belongings w/ patient. Catherine Rich and Dr Theda Sers aware of patient's void and PVR bladder scan. Informed accepting RN of patient's urinary status as well. Accepting RN verbalized understanding of all instructions.

## 2018-08-06 NOTE — Progress Notes (Addendum)
Pt unable to void. Bladder scan done = 822ml. On call paged. Awaiting new orders  Spoke with [place], [place], orders for in and out cath given.   In and out cath preformed via sterile technique. 1339ml obtained. Pt tolerated well.

## 2018-08-06 NOTE — TOC Transition Note (Addendum)
Transition of Care [facility]) - CM/SW Discharge Note   Patient Details  Name: Catherine Rich MRN: [id number] Date of Birth: 1928/08/07  Transition of Care [facility]) CM/SW Contact:  Catherine Rich, [place] Phone Number: 08/06/2018, 11:05 AM   Clinical Narrative:    Patient discharge to [facility] SNF, facility will accept the patient at 2:00pm Nurse call report to: [phone] CSW left voicemail to pt. Daughter.   12:00pm Daughter aware of discharge.  Updated D/C Summary sent.  PTAR arranged for 1:30pm transport.    Final next level of care: Skilled Nursing Facility Barriers to Discharge: No Barriers Identified   Patient Goals and CMS Choice Patient states their goals for this hospitalization and ongoing recovery are:: to go home CMS Medicare.gov Compare Post Acute Care list provided to:: Patient Choice offered to / list presented to : Patient  Discharge Placement PASRR number recieved: 08/05/18            Patient [facility] bed at: [facility])   Name of family member notified: Daughter- Catherine Rich Patient and family notified of of transfer: 08/06/18  Discharge Plan and Services   Discharge Planning Services: CM Consult Post Acute Care Choice: Home Health          DME Arranged: N/A DME Agency: NA       HH Arranged: PT [place] Agency: [place] ([place]) Date HH Agency Contacted: 08/03/18 Time [place]: 9643 Representative spoke with at [place]: [place] Determinants of Health ([place]) Interventions     Readmission Risk Interventions No flowsheet data found.

## 2018-08-07 DIAGNOSIS — E1122 Type 2 diabetes mellitus with diabetic chronic kidney disease: Secondary | ICD-10-CM | POA: Diagnosis not present

## 2018-08-07 DIAGNOSIS — Z96651 Presence of right artificial knee joint: Secondary | ICD-10-CM | POA: Diagnosis not present

## 2018-08-08 ENCOUNTER — Other Ambulatory Visit: Payer: Self-pay | Admitting: *Deleted

## 2018-08-08 NOTE — Patient Outreach (Signed)
Member assessed for potential [facility] Care Management needs as a benefit of  [place] Medicare.  Member is currently at [facility] receiving rehab therapy s/p knee surgery. Admitted to SNF on 08/06/18.  Member discussed in telephonic IDT meeting with [facility] facility staff, [facility] UM team, and writer.   Member lives alone. Writer to follow along for potential [facility] Care Management needs.   Will continue to collaborate with facility staff and [facility] UM team.   Marthenia Rolling, MSN-Ed, RN,BSN [place] Acute Care Coordinator [phone] [facility] [place]) [phone]  (Toll free office)

## 2018-08-14 DIAGNOSIS — Z96651 Presence of right artificial knee joint: Secondary | ICD-10-CM | POA: Diagnosis not present

## 2018-08-14 DIAGNOSIS — Z471 Aftercare following joint replacement surgery: Secondary | ICD-10-CM | POA: Diagnosis not present

## 2018-08-20 DIAGNOSIS — E1122 Type 2 diabetes mellitus with diabetic chronic kidney disease: Secondary | ICD-10-CM | POA: Diagnosis not present

## 2018-08-20 DIAGNOSIS — Z96651 Presence of right artificial knee joint: Secondary | ICD-10-CM | POA: Diagnosis not present

## 2018-08-22 ENCOUNTER — Other Ambulatory Visit: Payer: Self-pay | Admitting: *Deleted

## 2018-08-22 NOTE — Patient Outreach (Signed)
Member assessed for potential [facility] Care Management needs as a benefit of  [place] Medicare.  Member discussed in weekly telephonic IDT meeting with [facility] SNF staff, [facility] UM team, and writer.  Facility reports that Catherine Rich discharged home today with [place] to follow for home health services.   Writer to contact member to discuss [place] Management follow up.  Marthenia Rolling, MSN-Ed, RN,BSN [place] Acute Care Coordinator [phone] [facility]) [phone]  (Toll free office)

## 2018-08-22 NOTE — Patient Outreach (Signed)
Member assessed for potential [facility] Care Management needs as a benefit of [place] Medicare.  Telephone call made to Mrs. Koon's home to discuss [place] Management program. Member discharged from [place] [place] today 08/22/18.  Spoke with Mrs. Warda's daugher, Rose. Patient identifiers confirmed.   Rose states member does not need any other services. States " she has everything she needs and she follows up with her doctor every 3 months. Besides, we will have [facility] and that is plenty."  Will sign off as member's daughter declined [place] Management program services on member's behalf.    Marthenia Rolling, MSN-Ed, RN,BSN [place] Acute Care Coordinator [phone] [facility]) [phone]  (Toll free office)

## 2018-08-23 DIAGNOSIS — E1142 Type 2 diabetes mellitus with diabetic polyneuropathy: Secondary | ICD-10-CM | POA: Diagnosis not present

## 2018-08-23 DIAGNOSIS — Z96651 Presence of right artificial knee joint: Secondary | ICD-10-CM | POA: Diagnosis not present

## 2018-08-23 DIAGNOSIS — E039 Hypothyroidism, unspecified: Secondary | ICD-10-CM | POA: Diagnosis not present

## 2018-08-23 DIAGNOSIS — E1151 Type 2 diabetes mellitus with diabetic peripheral angiopathy without gangrene: Secondary | ICD-10-CM | POA: Diagnosis not present

## 2018-08-23 DIAGNOSIS — K5900 Constipation, unspecified: Secondary | ICD-10-CM | POA: Diagnosis not present

## 2018-08-23 DIAGNOSIS — E1122 Type 2 diabetes mellitus with diabetic chronic kidney disease: Secondary | ICD-10-CM | POA: Diagnosis not present

## 2018-08-23 DIAGNOSIS — I739 Peripheral vascular disease, unspecified: Secondary | ICD-10-CM | POA: Diagnosis not present

## 2018-08-23 DIAGNOSIS — F419 Anxiety disorder, unspecified: Secondary | ICD-10-CM | POA: Diagnosis not present

## 2018-08-23 DIAGNOSIS — E785 Hyperlipidemia, unspecified: Secondary | ICD-10-CM | POA: Diagnosis not present

## 2018-08-23 DIAGNOSIS — Z471 Aftercare following joint replacement surgery: Secondary | ICD-10-CM | POA: Diagnosis not present

## 2018-08-23 DIAGNOSIS — N189 Chronic kidney disease, unspecified: Secondary | ICD-10-CM | POA: Diagnosis not present

## 2018-08-24 DIAGNOSIS — R404 Transient alteration of awareness: Secondary | ICD-10-CM | POA: Diagnosis not present

## 2018-08-24 DIAGNOSIS — R4182 Altered mental status, unspecified: Secondary | ICD-10-CM | POA: Diagnosis not present

## 2018-08-24 DIAGNOSIS — R4020 Unspecified coma: Secondary | ICD-10-CM | POA: Diagnosis not present

## 2018-08-24 DIAGNOSIS — R0989 Other specified symptoms and signs involving the circulatory and respiratory systems: Secondary | ICD-10-CM | POA: Diagnosis not present

## 2018-08-24 DIAGNOSIS — R000 Tachycardia, unspecified: Secondary | ICD-10-CM | POA: Diagnosis not present

## 2018-08-24 DIAGNOSIS — E162 Hypoglycemia, unspecified: Secondary | ICD-10-CM | POA: Diagnosis not present

## 2018-08-24 DIAGNOSIS — I959 Hypotension, unspecified: Secondary | ICD-10-CM | POA: Diagnosis not present

## 2018-08-24 DIAGNOSIS — E161 Other hypoglycemia: Secondary | ICD-10-CM | POA: Diagnosis not present

## 2018-08-24 DIAGNOSIS — Z209 Contact with and (suspected) exposure to unspecified communicable disease: Secondary | ICD-10-CM | POA: Diagnosis not present

## 2018-08-27 DIAGNOSIS — I739 Peripheral vascular disease, unspecified: Secondary | ICD-10-CM | POA: Diagnosis not present

## 2018-08-27 DIAGNOSIS — Z471 Aftercare following joint replacement surgery: Secondary | ICD-10-CM | POA: Diagnosis not present

## 2018-08-27 DIAGNOSIS — Z96651 Presence of right artificial knee joint: Secondary | ICD-10-CM | POA: Diagnosis not present

## 2018-08-27 DIAGNOSIS — E1151 Type 2 diabetes mellitus with diabetic peripheral angiopathy without gangrene: Secondary | ICD-10-CM | POA: Diagnosis not present

## 2018-08-27 DIAGNOSIS — N189 Chronic kidney disease, unspecified: Secondary | ICD-10-CM | POA: Diagnosis not present

## 2018-08-27 DIAGNOSIS — E1122 Type 2 diabetes mellitus with diabetic chronic kidney disease: Secondary | ICD-10-CM | POA: Diagnosis not present

## 2018-08-28 DIAGNOSIS — I739 Peripheral vascular disease, unspecified: Secondary | ICD-10-CM | POA: Diagnosis not present

## 2018-08-28 DIAGNOSIS — Z471 Aftercare following joint replacement surgery: Secondary | ICD-10-CM | POA: Diagnosis not present

## 2018-08-28 DIAGNOSIS — Z96651 Presence of right artificial knee joint: Secondary | ICD-10-CM | POA: Diagnosis not present

## 2018-08-28 DIAGNOSIS — N189 Chronic kidney disease, unspecified: Secondary | ICD-10-CM | POA: Diagnosis not present

## 2018-08-28 DIAGNOSIS — E1122 Type 2 diabetes mellitus with diabetic chronic kidney disease: Secondary | ICD-10-CM | POA: Diagnosis not present

## 2018-08-28 DIAGNOSIS — E1151 Type 2 diabetes mellitus with diabetic peripheral angiopathy without gangrene: Secondary | ICD-10-CM | POA: Diagnosis not present

## 2018-08-30 DIAGNOSIS — E1122 Type 2 diabetes mellitus with diabetic chronic kidney disease: Secondary | ICD-10-CM | POA: Diagnosis not present

## 2018-08-30 DIAGNOSIS — Z471 Aftercare following joint replacement surgery: Secondary | ICD-10-CM | POA: Diagnosis not present

## 2018-08-30 DIAGNOSIS — E1151 Type 2 diabetes mellitus with diabetic peripheral angiopathy without gangrene: Secondary | ICD-10-CM | POA: Diagnosis not present

## 2018-08-30 DIAGNOSIS — I739 Peripheral vascular disease, unspecified: Secondary | ICD-10-CM | POA: Diagnosis not present

## 2018-08-30 DIAGNOSIS — Z96651 Presence of right artificial knee joint: Secondary | ICD-10-CM | POA: Diagnosis not present

## 2018-08-30 DIAGNOSIS — N189 Chronic kidney disease, unspecified: Secondary | ICD-10-CM | POA: Diagnosis not present

## 2018-09-02 DIAGNOSIS — Z96651 Presence of right artificial knee joint: Secondary | ICD-10-CM | POA: Diagnosis not present

## 2018-09-02 DIAGNOSIS — Z471 Aftercare following joint replacement surgery: Secondary | ICD-10-CM | POA: Diagnosis not present

## 2018-09-02 DIAGNOSIS — I739 Peripheral vascular disease, unspecified: Secondary | ICD-10-CM | POA: Diagnosis not present

## 2018-09-02 DIAGNOSIS — E1122 Type 2 diabetes mellitus with diabetic chronic kidney disease: Secondary | ICD-10-CM | POA: Diagnosis not present

## 2018-09-02 DIAGNOSIS — N189 Chronic kidney disease, unspecified: Secondary | ICD-10-CM | POA: Diagnosis not present

## 2018-09-02 DIAGNOSIS — E1151 Type 2 diabetes mellitus with diabetic peripheral angiopathy without gangrene: Secondary | ICD-10-CM | POA: Diagnosis not present

## 2018-09-03 DIAGNOSIS — N189 Chronic kidney disease, unspecified: Secondary | ICD-10-CM | POA: Diagnosis not present

## 2018-09-03 DIAGNOSIS — I739 Peripheral vascular disease, unspecified: Secondary | ICD-10-CM | POA: Diagnosis not present

## 2018-09-03 DIAGNOSIS — Z471 Aftercare following joint replacement surgery: Secondary | ICD-10-CM | POA: Diagnosis not present

## 2018-09-03 DIAGNOSIS — Z96651 Presence of right artificial knee joint: Secondary | ICD-10-CM | POA: Diagnosis not present

## 2018-09-03 DIAGNOSIS — E1151 Type 2 diabetes mellitus with diabetic peripheral angiopathy without gangrene: Secondary | ICD-10-CM | POA: Diagnosis not present

## 2018-09-03 DIAGNOSIS — E1122 Type 2 diabetes mellitus with diabetic chronic kidney disease: Secondary | ICD-10-CM | POA: Diagnosis not present

## 2018-09-04 ENCOUNTER — Other Ambulatory Visit: Payer: Self-pay

## 2018-09-04 NOTE — Patient Outreach (Signed)
[facility]) Care Management  09/04/2018  Catherine Rich Jul 03, 1928 [id number]   EMMI- General Discharge RED ON EMMI ALERT Day # 4 Date: 09/03/2018 Red Alert Reason: Know who to call about changes in condition? No  Incoming call from daughter Catherine Rich.  She states she is trying to figure out why CM called.  Advised reason for call.  She then says that we are a robo call that calls everybody.  Explained to her the process and why the call was made.  She would not continue and states have a great day.    Plan: RN CM will close case.    Jone Baseman, RN, MSN [place] Management Care Management Coordinator Direct Line [phone] Cell [phone] Toll Free: [phone]  Fax: [phone]

## 2018-09-04 NOTE — Patient Outreach (Signed)
Catherine Rich) Care Management  09/04/2018  Catherine Rich January 30, 1929 [id number]   EMMI- General Discharge RED ON EMMI ALERT Day # 4 Date:  09/03/2018 Red Alert Reason:  Know who to call about changes in condition? No  Outreach attempt: No answer.  HIPAA compliant voice message left.  Plan: RN CM will attempt within 4 business days and send letter.   Catherine Baseman, RN, MSN [facility] Care Management Care Management Coordinator Direct Line [phone] Toll Free: [phone]  Fax: [phone]

## 2018-09-05 DIAGNOSIS — N189 Chronic kidney disease, unspecified: Secondary | ICD-10-CM | POA: Diagnosis not present

## 2018-09-05 DIAGNOSIS — I739 Peripheral vascular disease, unspecified: Secondary | ICD-10-CM | POA: Diagnosis not present

## 2018-09-05 DIAGNOSIS — Z96651 Presence of right artificial knee joint: Secondary | ICD-10-CM | POA: Diagnosis not present

## 2018-09-05 DIAGNOSIS — E1122 Type 2 diabetes mellitus with diabetic chronic kidney disease: Secondary | ICD-10-CM | POA: Diagnosis not present

## 2018-09-05 DIAGNOSIS — E1151 Type 2 diabetes mellitus with diabetic peripheral angiopathy without gangrene: Secondary | ICD-10-CM | POA: Diagnosis not present

## 2018-09-05 DIAGNOSIS — Z471 Aftercare following joint replacement surgery: Secondary | ICD-10-CM | POA: Diagnosis not present

## 2018-09-06 DIAGNOSIS — N189 Chronic kidney disease, unspecified: Secondary | ICD-10-CM | POA: Diagnosis not present

## 2018-09-06 DIAGNOSIS — I739 Peripheral vascular disease, unspecified: Secondary | ICD-10-CM | POA: Diagnosis not present

## 2018-09-06 DIAGNOSIS — E1151 Type 2 diabetes mellitus with diabetic peripheral angiopathy without gangrene: Secondary | ICD-10-CM | POA: Diagnosis not present

## 2018-09-06 DIAGNOSIS — Z96651 Presence of right artificial knee joint: Secondary | ICD-10-CM | POA: Diagnosis not present

## 2018-09-06 DIAGNOSIS — Z471 Aftercare following joint replacement surgery: Secondary | ICD-10-CM | POA: Diagnosis not present

## 2018-09-06 DIAGNOSIS — E1122 Type 2 diabetes mellitus with diabetic chronic kidney disease: Secondary | ICD-10-CM | POA: Diagnosis not present

## 2018-09-09 ENCOUNTER — Ambulatory Visit: Payer: Self-pay

## 2018-09-09 DIAGNOSIS — R2689 Other abnormalities of gait and mobility: Secondary | ICD-10-CM | POA: Diagnosis not present

## 2018-09-09 DIAGNOSIS — M6281 Muscle weakness (generalized): Secondary | ICD-10-CM | POA: Diagnosis not present

## 2018-09-09 DIAGNOSIS — Z471 Aftercare following joint replacement surgery: Secondary | ICD-10-CM | POA: Diagnosis not present

## 2018-09-09 DIAGNOSIS — Z96651 Presence of right artificial knee joint: Secondary | ICD-10-CM | POA: Diagnosis not present

## 2018-09-09 DIAGNOSIS — M25661 Stiffness of right knee, not elsewhere classified: Secondary | ICD-10-CM | POA: Diagnosis not present

## 2018-09-11 DIAGNOSIS — Z471 Aftercare following joint replacement surgery: Secondary | ICD-10-CM | POA: Diagnosis not present

## 2018-09-11 DIAGNOSIS — Z96651 Presence of right artificial knee joint: Secondary | ICD-10-CM | POA: Diagnosis not present

## 2018-09-11 DIAGNOSIS — R2689 Other abnormalities of gait and mobility: Secondary | ICD-10-CM | POA: Diagnosis not present

## 2018-09-11 DIAGNOSIS — M6281 Muscle weakness (generalized): Secondary | ICD-10-CM | POA: Diagnosis not present

## 2018-09-11 DIAGNOSIS — M25661 Stiffness of right knee, not elsewhere classified: Secondary | ICD-10-CM | POA: Diagnosis not present

## 2018-09-13 DIAGNOSIS — Z471 Aftercare following joint replacement surgery: Secondary | ICD-10-CM | POA: Diagnosis not present

## 2018-09-13 DIAGNOSIS — Z96651 Presence of right artificial knee joint: Secondary | ICD-10-CM | POA: Diagnosis not present

## 2018-09-13 DIAGNOSIS — M6281 Muscle weakness (generalized): Secondary | ICD-10-CM | POA: Diagnosis not present

## 2018-09-13 DIAGNOSIS — R2689 Other abnormalities of gait and mobility: Secondary | ICD-10-CM | POA: Diagnosis not present

## 2018-09-13 DIAGNOSIS — M25661 Stiffness of right knee, not elsewhere classified: Secondary | ICD-10-CM | POA: Diagnosis not present

## 2018-09-16 DIAGNOSIS — Z471 Aftercare following joint replacement surgery: Secondary | ICD-10-CM | POA: Diagnosis not present

## 2018-09-16 DIAGNOSIS — M25661 Stiffness of right knee, not elsewhere classified: Secondary | ICD-10-CM | POA: Diagnosis not present

## 2018-09-16 DIAGNOSIS — M6281 Muscle weakness (generalized): Secondary | ICD-10-CM | POA: Diagnosis not present

## 2018-09-16 DIAGNOSIS — R2689 Other abnormalities of gait and mobility: Secondary | ICD-10-CM | POA: Diagnosis not present

## 2018-09-16 DIAGNOSIS — Z96651 Presence of right artificial knee joint: Secondary | ICD-10-CM | POA: Diagnosis not present

## 2018-09-18 DIAGNOSIS — Z96651 Presence of right artificial knee joint: Secondary | ICD-10-CM | POA: Diagnosis not present

## 2018-09-18 DIAGNOSIS — Z471 Aftercare following joint replacement surgery: Secondary | ICD-10-CM | POA: Diagnosis not present

## 2018-09-18 DIAGNOSIS — M25661 Stiffness of right knee, not elsewhere classified: Secondary | ICD-10-CM | POA: Diagnosis not present

## 2018-09-18 DIAGNOSIS — R2689 Other abnormalities of gait and mobility: Secondary | ICD-10-CM | POA: Diagnosis not present

## 2018-09-18 DIAGNOSIS — M6281 Muscle weakness (generalized): Secondary | ICD-10-CM | POA: Diagnosis not present

## 2018-09-19 DIAGNOSIS — H353211 Exudative age-related macular degeneration, right eye, with active choroidal neovascularization: Secondary | ICD-10-CM | POA: Diagnosis not present

## 2018-09-20 DIAGNOSIS — M1711 Unilateral primary osteoarthritis, right knee: Secondary | ICD-10-CM | POA: Diagnosis not present

## 2018-09-20 DIAGNOSIS — Z6830 Body mass index (BMI) 30.0-30.9, adult: Secondary | ICD-10-CM | POA: Diagnosis not present

## 2018-09-20 DIAGNOSIS — M25661 Stiffness of right knee, not elsewhere classified: Secondary | ICD-10-CM | POA: Diagnosis not present

## 2018-09-20 DIAGNOSIS — M6281 Muscle weakness (generalized): Secondary | ICD-10-CM | POA: Diagnosis not present

## 2018-09-20 DIAGNOSIS — Z471 Aftercare following joint replacement surgery: Secondary | ICD-10-CM | POA: Diagnosis not present

## 2018-09-20 DIAGNOSIS — R2689 Other abnormalities of gait and mobility: Secondary | ICD-10-CM | POA: Diagnosis not present

## 2018-09-20 DIAGNOSIS — Z96651 Presence of right artificial knee joint: Secondary | ICD-10-CM | POA: Diagnosis not present

## 2018-09-20 DIAGNOSIS — E1122 Type 2 diabetes mellitus with diabetic chronic kidney disease: Secondary | ICD-10-CM | POA: Diagnosis not present

## 2018-09-23 DIAGNOSIS — R300 Dysuria: Secondary | ICD-10-CM | POA: Diagnosis not present

## 2018-09-23 DIAGNOSIS — H57813 Brow ptosis, bilateral: Secondary | ICD-10-CM | POA: Diagnosis not present

## 2018-09-23 DIAGNOSIS — H02423 Myogenic ptosis of bilateral eyelids: Secondary | ICD-10-CM | POA: Diagnosis not present

## 2018-09-23 DIAGNOSIS — H10523 Angular blepharoconjunctivitis, bilateral: Secondary | ICD-10-CM | POA: Diagnosis not present

## 2018-09-24 DIAGNOSIS — M25661 Stiffness of right knee, not elsewhere classified: Secondary | ICD-10-CM | POA: Diagnosis not present

## 2018-09-24 DIAGNOSIS — Z96651 Presence of right artificial knee joint: Secondary | ICD-10-CM | POA: Diagnosis not present

## 2018-09-24 DIAGNOSIS — R2689 Other abnormalities of gait and mobility: Secondary | ICD-10-CM | POA: Diagnosis not present

## 2018-09-24 DIAGNOSIS — Z471 Aftercare following joint replacement surgery: Secondary | ICD-10-CM | POA: Diagnosis not present

## 2018-09-24 DIAGNOSIS — M6281 Muscle weakness (generalized): Secondary | ICD-10-CM | POA: Diagnosis not present

## 2018-09-26 DIAGNOSIS — M6281 Muscle weakness (generalized): Secondary | ICD-10-CM | POA: Diagnosis not present

## 2018-09-26 DIAGNOSIS — Z96651 Presence of right artificial knee joint: Secondary | ICD-10-CM | POA: Diagnosis not present

## 2018-09-26 DIAGNOSIS — M25661 Stiffness of right knee, not elsewhere classified: Secondary | ICD-10-CM | POA: Diagnosis not present

## 2018-09-26 DIAGNOSIS — R2689 Other abnormalities of gait and mobility: Secondary | ICD-10-CM | POA: Diagnosis not present

## 2018-09-26 DIAGNOSIS — Z471 Aftercare following joint replacement surgery: Secondary | ICD-10-CM | POA: Diagnosis not present

## 2018-09-27 DIAGNOSIS — M6281 Muscle weakness (generalized): Secondary | ICD-10-CM | POA: Diagnosis not present

## 2018-09-27 DIAGNOSIS — R2689 Other abnormalities of gait and mobility: Secondary | ICD-10-CM | POA: Diagnosis not present

## 2018-09-27 DIAGNOSIS — Z471 Aftercare following joint replacement surgery: Secondary | ICD-10-CM | POA: Diagnosis not present

## 2018-09-27 DIAGNOSIS — Z96651 Presence of right artificial knee joint: Secondary | ICD-10-CM | POA: Diagnosis not present

## 2018-09-27 DIAGNOSIS — M25661 Stiffness of right knee, not elsewhere classified: Secondary | ICD-10-CM | POA: Diagnosis not present

## 2018-09-30 DIAGNOSIS — Z471 Aftercare following joint replacement surgery: Secondary | ICD-10-CM | POA: Diagnosis not present

## 2018-09-30 DIAGNOSIS — Z96651 Presence of right artificial knee joint: Secondary | ICD-10-CM | POA: Diagnosis not present

## 2018-09-30 DIAGNOSIS — R2689 Other abnormalities of gait and mobility: Secondary | ICD-10-CM | POA: Diagnosis not present

## 2018-09-30 DIAGNOSIS — M6281 Muscle weakness (generalized): Secondary | ICD-10-CM | POA: Diagnosis not present

## 2018-09-30 DIAGNOSIS — M25661 Stiffness of right knee, not elsewhere classified: Secondary | ICD-10-CM | POA: Diagnosis not present

## 2018-10-01 DIAGNOSIS — Z471 Aftercare following joint replacement surgery: Secondary | ICD-10-CM | POA: Diagnosis not present

## 2018-10-01 DIAGNOSIS — M6281 Muscle weakness (generalized): Secondary | ICD-10-CM | POA: Diagnosis not present

## 2018-10-01 DIAGNOSIS — Z96651 Presence of right artificial knee joint: Secondary | ICD-10-CM | POA: Diagnosis not present

## 2018-10-01 DIAGNOSIS — R2689 Other abnormalities of gait and mobility: Secondary | ICD-10-CM | POA: Diagnosis not present

## 2018-10-01 DIAGNOSIS — M25661 Stiffness of right knee, not elsewhere classified: Secondary | ICD-10-CM | POA: Diagnosis not present

## 2018-10-04 DIAGNOSIS — Z96651 Presence of right artificial knee joint: Secondary | ICD-10-CM | POA: Diagnosis not present

## 2018-10-04 DIAGNOSIS — M6281 Muscle weakness (generalized): Secondary | ICD-10-CM | POA: Diagnosis not present

## 2018-10-04 DIAGNOSIS — M25661 Stiffness of right knee, not elsewhere classified: Secondary | ICD-10-CM | POA: Diagnosis not present

## 2018-10-04 DIAGNOSIS — R2689 Other abnormalities of gait and mobility: Secondary | ICD-10-CM | POA: Diagnosis not present

## 2018-10-04 DIAGNOSIS — Z471 Aftercare following joint replacement surgery: Secondary | ICD-10-CM | POA: Diagnosis not present

## 2018-10-07 DIAGNOSIS — Z96651 Presence of right artificial knee joint: Secondary | ICD-10-CM | POA: Diagnosis not present

## 2018-10-07 DIAGNOSIS — Z471 Aftercare following joint replacement surgery: Secondary | ICD-10-CM | POA: Diagnosis not present

## 2018-10-07 DIAGNOSIS — M25661 Stiffness of right knee, not elsewhere classified: Secondary | ICD-10-CM | POA: Diagnosis not present

## 2018-10-07 DIAGNOSIS — R2689 Other abnormalities of gait and mobility: Secondary | ICD-10-CM | POA: Diagnosis not present

## 2018-10-07 DIAGNOSIS — M6281 Muscle weakness (generalized): Secondary | ICD-10-CM | POA: Diagnosis not present

## 2018-10-10 DIAGNOSIS — Z471 Aftercare following joint replacement surgery: Secondary | ICD-10-CM | POA: Diagnosis not present

## 2018-10-10 DIAGNOSIS — R2689 Other abnormalities of gait and mobility: Secondary | ICD-10-CM | POA: Diagnosis not present

## 2018-10-10 DIAGNOSIS — M6281 Muscle weakness (generalized): Secondary | ICD-10-CM | POA: Diagnosis not present

## 2018-10-10 DIAGNOSIS — H353221 Exudative age-related macular degeneration, left eye, with active choroidal neovascularization: Secondary | ICD-10-CM | POA: Diagnosis not present

## 2018-10-10 DIAGNOSIS — M25661 Stiffness of right knee, not elsewhere classified: Secondary | ICD-10-CM | POA: Diagnosis not present

## 2018-10-10 DIAGNOSIS — Z96651 Presence of right artificial knee joint: Secondary | ICD-10-CM | POA: Diagnosis not present

## 2018-10-11 DIAGNOSIS — Z96651 Presence of right artificial knee joint: Secondary | ICD-10-CM | POA: Diagnosis not present

## 2018-10-11 DIAGNOSIS — M25661 Stiffness of right knee, not elsewhere classified: Secondary | ICD-10-CM | POA: Diagnosis not present

## 2018-10-11 DIAGNOSIS — Z471 Aftercare following joint replacement surgery: Secondary | ICD-10-CM | POA: Diagnosis not present

## 2018-10-11 DIAGNOSIS — M6281 Muscle weakness (generalized): Secondary | ICD-10-CM | POA: Diagnosis not present

## 2018-10-11 DIAGNOSIS — R2689 Other abnormalities of gait and mobility: Secondary | ICD-10-CM | POA: Diagnosis not present

## 2018-10-14 DIAGNOSIS — M6281 Muscle weakness (generalized): Secondary | ICD-10-CM | POA: Diagnosis not present

## 2018-10-14 DIAGNOSIS — M25661 Stiffness of right knee, not elsewhere classified: Secondary | ICD-10-CM | POA: Diagnosis not present

## 2018-10-14 DIAGNOSIS — R2689 Other abnormalities of gait and mobility: Secondary | ICD-10-CM | POA: Diagnosis not present

## 2018-10-14 DIAGNOSIS — Z471 Aftercare following joint replacement surgery: Secondary | ICD-10-CM | POA: Diagnosis not present

## 2018-10-14 DIAGNOSIS — Z96651 Presence of right artificial knee joint: Secondary | ICD-10-CM | POA: Diagnosis not present

## 2018-10-16 DIAGNOSIS — R2689 Other abnormalities of gait and mobility: Secondary | ICD-10-CM | POA: Diagnosis not present

## 2018-10-16 DIAGNOSIS — M6281 Muscle weakness (generalized): Secondary | ICD-10-CM | POA: Diagnosis not present

## 2018-10-16 DIAGNOSIS — M25661 Stiffness of right knee, not elsewhere classified: Secondary | ICD-10-CM | POA: Diagnosis not present

## 2018-10-16 DIAGNOSIS — Z96651 Presence of right artificial knee joint: Secondary | ICD-10-CM | POA: Diagnosis not present

## 2018-10-16 DIAGNOSIS — Z471 Aftercare following joint replacement surgery: Secondary | ICD-10-CM | POA: Diagnosis not present

## 2018-10-17 DIAGNOSIS — N189 Chronic kidney disease, unspecified: Secondary | ICD-10-CM | POA: Diagnosis not present

## 2018-10-17 DIAGNOSIS — I10 Essential (primary) hypertension: Secondary | ICD-10-CM | POA: Diagnosis not present

## 2018-10-17 DIAGNOSIS — D649 Anemia, unspecified: Secondary | ICD-10-CM | POA: Diagnosis not present

## 2018-10-17 DIAGNOSIS — K210 Gastro-esophageal reflux disease with esophagitis: Secondary | ICD-10-CM | POA: Diagnosis not present

## 2018-10-17 DIAGNOSIS — N184 Chronic kidney disease, stage 4 (severe): Secondary | ICD-10-CM | POA: Diagnosis not present

## 2018-10-17 DIAGNOSIS — E1122 Type 2 diabetes mellitus with diabetic chronic kidney disease: Secondary | ICD-10-CM | POA: Diagnosis not present

## 2018-10-17 DIAGNOSIS — J449 Chronic obstructive pulmonary disease, unspecified: Secondary | ICD-10-CM | POA: Diagnosis not present

## 2018-10-17 DIAGNOSIS — E782 Mixed hyperlipidemia: Secondary | ICD-10-CM | POA: Diagnosis not present

## 2018-10-18 DIAGNOSIS — M6281 Muscle weakness (generalized): Secondary | ICD-10-CM | POA: Diagnosis not present

## 2018-10-18 DIAGNOSIS — M25661 Stiffness of right knee, not elsewhere classified: Secondary | ICD-10-CM | POA: Diagnosis not present

## 2018-10-18 DIAGNOSIS — Z96651 Presence of right artificial knee joint: Secondary | ICD-10-CM | POA: Diagnosis not present

## 2018-10-18 DIAGNOSIS — R2689 Other abnormalities of gait and mobility: Secondary | ICD-10-CM | POA: Diagnosis not present

## 2018-10-18 DIAGNOSIS — Z471 Aftercare following joint replacement surgery: Secondary | ICD-10-CM | POA: Diagnosis not present

## 2018-10-24 DIAGNOSIS — D500 Iron deficiency anemia secondary to blood loss (chronic): Secondary | ICD-10-CM | POA: Diagnosis not present

## 2018-10-24 DIAGNOSIS — I10 Essential (primary) hypertension: Secondary | ICD-10-CM | POA: Diagnosis not present

## 2018-10-24 DIAGNOSIS — I714 Abdominal aortic aneurysm, without rupture: Secondary | ICD-10-CM | POA: Diagnosis not present

## 2018-10-24 DIAGNOSIS — J449 Chronic obstructive pulmonary disease, unspecified: Secondary | ICD-10-CM | POA: Diagnosis not present

## 2018-10-24 DIAGNOSIS — Z23 Encounter for immunization: Secondary | ICD-10-CM | POA: Diagnosis not present

## 2018-10-24 DIAGNOSIS — E1122 Type 2 diabetes mellitus with diabetic chronic kidney disease: Secondary | ICD-10-CM | POA: Diagnosis not present

## 2018-10-24 DIAGNOSIS — J9611 Chronic respiratory failure with hypoxia: Secondary | ICD-10-CM | POA: Diagnosis not present

## 2018-10-24 DIAGNOSIS — Z6828 Body mass index (BMI) 28.0-28.9, adult: Secondary | ICD-10-CM | POA: Diagnosis not present

## 2018-11-04 DIAGNOSIS — Z01818 Encounter for other preprocedural examination: Secondary | ICD-10-CM | POA: Diagnosis not present

## 2018-11-07 DIAGNOSIS — H353211 Exudative age-related macular degeneration, right eye, with active choroidal neovascularization: Secondary | ICD-10-CM | POA: Diagnosis not present

## 2018-11-18 DIAGNOSIS — Z01818 Encounter for other preprocedural examination: Secondary | ICD-10-CM | POA: Diagnosis not present

## 2018-11-22 DIAGNOSIS — Z880 Allergy status to penicillin: Secondary | ICD-10-CM | POA: Diagnosis not present

## 2018-11-22 DIAGNOSIS — E119 Type 2 diabetes mellitus without complications: Secondary | ICD-10-CM | POA: Diagnosis not present

## 2018-11-22 DIAGNOSIS — E785 Hyperlipidemia, unspecified: Secondary | ICD-10-CM | POA: Diagnosis not present

## 2018-11-22 DIAGNOSIS — H02423 Myogenic ptosis of bilateral eyelids: Secondary | ICD-10-CM | POA: Diagnosis not present

## 2018-11-22 DIAGNOSIS — K219 Gastro-esophageal reflux disease without esophagitis: Secondary | ICD-10-CM | POA: Diagnosis not present

## 2018-11-22 DIAGNOSIS — Z7982 Long term (current) use of aspirin: Secondary | ICD-10-CM | POA: Diagnosis not present

## 2018-11-22 DIAGNOSIS — Z882 Allergy status to sulfonamides status: Secondary | ICD-10-CM | POA: Diagnosis not present

## 2018-11-22 DIAGNOSIS — Z91011 Allergy to milk products: Secondary | ICD-10-CM | POA: Diagnosis not present

## 2018-11-22 DIAGNOSIS — Z7984 Long term (current) use of oral hypoglycemic drugs: Secondary | ICD-10-CM | POA: Diagnosis not present

## 2018-11-22 DIAGNOSIS — Z885 Allergy status to narcotic agent status: Secondary | ICD-10-CM | POA: Diagnosis not present

## 2018-11-22 DIAGNOSIS — Z87891 Personal history of nicotine dependence: Secondary | ICD-10-CM | POA: Diagnosis not present

## 2018-11-22 DIAGNOSIS — H57813 Brow ptosis, bilateral: Secondary | ICD-10-CM | POA: Diagnosis not present

## 2018-11-22 DIAGNOSIS — Z91012 Allergy to eggs: Secondary | ICD-10-CM | POA: Diagnosis not present

## 2018-11-22 DIAGNOSIS — Z96651 Presence of right artificial knee joint: Secondary | ICD-10-CM | POA: Diagnosis not present

## 2018-11-22 DIAGNOSIS — E039 Hypothyroidism, unspecified: Secondary | ICD-10-CM | POA: Diagnosis not present

## 2018-12-12 DIAGNOSIS — H353211 Exudative age-related macular degeneration, right eye, with active choroidal neovascularization: Secondary | ICD-10-CM | POA: Diagnosis not present

## 2018-12-12 DIAGNOSIS — H353221 Exudative age-related macular degeneration, left eye, with active choroidal neovascularization: Secondary | ICD-10-CM | POA: Diagnosis not present

## 2018-12-12 DIAGNOSIS — H5711 Ocular pain, right eye: Secondary | ICD-10-CM | POA: Diagnosis not present

## 2018-12-12 DIAGNOSIS — H04123 Dry eye syndrome of bilateral lacrimal glands: Secondary | ICD-10-CM | POA: Diagnosis not present

## 2018-12-12 DIAGNOSIS — H35453 Secondary pigmentary degeneration, bilateral: Secondary | ICD-10-CM | POA: Diagnosis not present

## 2018-12-12 DIAGNOSIS — H1045 Other chronic allergic conjunctivitis: Secondary | ICD-10-CM | POA: Diagnosis not present

## 2018-12-12 DIAGNOSIS — H35363 Drusen (degenerative) of macula, bilateral: Secondary | ICD-10-CM | POA: Diagnosis not present

## 2018-12-12 DIAGNOSIS — H43392 Other vitreous opacities, left eye: Secondary | ICD-10-CM | POA: Diagnosis not present

## 2018-12-12 DIAGNOSIS — Z961 Presence of intraocular lens: Secondary | ICD-10-CM | POA: Diagnosis not present

## 2018-12-19 DIAGNOSIS — D500 Iron deficiency anemia secondary to blood loss (chronic): Secondary | ICD-10-CM | POA: Diagnosis not present

## 2018-12-19 DIAGNOSIS — D529 Folate deficiency anemia, unspecified: Secondary | ICD-10-CM | POA: Diagnosis not present

## 2018-12-19 DIAGNOSIS — I10 Essential (primary) hypertension: Secondary | ICD-10-CM | POA: Diagnosis not present

## 2018-12-19 DIAGNOSIS — E1122 Type 2 diabetes mellitus with diabetic chronic kidney disease: Secondary | ICD-10-CM | POA: Diagnosis not present

## 2018-12-19 DIAGNOSIS — K219 Gastro-esophageal reflux disease without esophagitis: Secondary | ICD-10-CM | POA: Diagnosis not present

## 2018-12-19 DIAGNOSIS — J449 Chronic obstructive pulmonary disease, unspecified: Secondary | ICD-10-CM | POA: Diagnosis not present

## 2018-12-19 DIAGNOSIS — E875 Hyperkalemia: Secondary | ICD-10-CM | POA: Diagnosis not present

## 2018-12-19 DIAGNOSIS — D631 Anemia in chronic kidney disease: Secondary | ICD-10-CM | POA: Diagnosis not present

## 2018-12-19 DIAGNOSIS — E782 Mixed hyperlipidemia: Secondary | ICD-10-CM | POA: Diagnosis not present

## 2018-12-19 DIAGNOSIS — D519 Vitamin B12 deficiency anemia, unspecified: Secondary | ICD-10-CM | POA: Diagnosis not present

## 2018-12-19 DIAGNOSIS — E039 Hypothyroidism, unspecified: Secondary | ICD-10-CM | POA: Diagnosis not present

## 2018-12-19 DIAGNOSIS — N184 Chronic kidney disease, stage 4 (severe): Secondary | ICD-10-CM | POA: Diagnosis not present

## 2018-12-19 DIAGNOSIS — H353211 Exudative age-related macular degeneration, right eye, with active choroidal neovascularization: Secondary | ICD-10-CM | POA: Diagnosis not present

## 2018-12-24 DIAGNOSIS — E782 Mixed hyperlipidemia: Secondary | ICD-10-CM | POA: Diagnosis not present

## 2018-12-24 DIAGNOSIS — N184 Chronic kidney disease, stage 4 (severe): Secondary | ICD-10-CM | POA: Diagnosis not present

## 2018-12-24 DIAGNOSIS — K219 Gastro-esophageal reflux disease without esophagitis: Secondary | ICD-10-CM | POA: Diagnosis not present

## 2018-12-24 DIAGNOSIS — I714 Abdominal aortic aneurysm, without rupture: Secondary | ICD-10-CM | POA: Diagnosis not present

## 2018-12-24 DIAGNOSIS — J9611 Chronic respiratory failure with hypoxia: Secondary | ICD-10-CM | POA: Diagnosis not present

## 2018-12-24 DIAGNOSIS — F331 Major depressive disorder, recurrent, moderate: Secondary | ICD-10-CM | POA: Diagnosis not present

## 2018-12-24 DIAGNOSIS — J449 Chronic obstructive pulmonary disease, unspecified: Secondary | ICD-10-CM | POA: Diagnosis not present

## 2018-12-24 DIAGNOSIS — Z0001 Encounter for general adult medical examination with abnormal findings: Secondary | ICD-10-CM | POA: Diagnosis not present

## 2018-12-24 DIAGNOSIS — E039 Hypothyroidism, unspecified: Secondary | ICD-10-CM | POA: Diagnosis not present

## 2018-12-24 DIAGNOSIS — G252 Other specified forms of tremor: Secondary | ICD-10-CM | POA: Diagnosis not present

## 2018-12-24 DIAGNOSIS — D500 Iron deficiency anemia secondary to blood loss (chronic): Secondary | ICD-10-CM | POA: Diagnosis not present

## 2018-12-24 DIAGNOSIS — Z1212 Encounter for screening for malignant neoplasm of rectum: Secondary | ICD-10-CM | POA: Diagnosis not present

## 2018-12-24 DIAGNOSIS — E1122 Type 2 diabetes mellitus with diabetic chronic kidney disease: Secondary | ICD-10-CM | POA: Diagnosis not present

## 2018-12-24 DIAGNOSIS — I10 Essential (primary) hypertension: Secondary | ICD-10-CM | POA: Diagnosis not present

## 2018-12-27 DIAGNOSIS — H353221 Exudative age-related macular degeneration, left eye, with active choroidal neovascularization: Secondary | ICD-10-CM | POA: Diagnosis not present

## 2019-02-08 DIAGNOSIS — R05 Cough: Secondary | ICD-10-CM | POA: Diagnosis not present

## 2019-02-08 DIAGNOSIS — J449 Chronic obstructive pulmonary disease, unspecified: Secondary | ICD-10-CM | POA: Diagnosis not present

## 2019-02-18 DIAGNOSIS — E039 Hypothyroidism, unspecified: Secondary | ICD-10-CM | POA: Diagnosis not present

## 2019-02-18 DIAGNOSIS — J449 Chronic obstructive pulmonary disease, unspecified: Secondary | ICD-10-CM | POA: Diagnosis not present

## 2019-02-18 DIAGNOSIS — Z6830 Body mass index (BMI) 30.0-30.9, adult: Secondary | ICD-10-CM | POA: Diagnosis not present

## 2019-02-18 DIAGNOSIS — E1122 Type 2 diabetes mellitus with diabetic chronic kidney disease: Secondary | ICD-10-CM | POA: Diagnosis not present

## 2019-02-18 DIAGNOSIS — I714 Abdominal aortic aneurysm, without rupture: Secondary | ICD-10-CM | POA: Diagnosis not present

## 2019-02-18 DIAGNOSIS — D500 Iron deficiency anemia secondary to blood loss (chronic): Secondary | ICD-10-CM | POA: Diagnosis not present

## 2019-02-18 DIAGNOSIS — I10 Essential (primary) hypertension: Secondary | ICD-10-CM | POA: Diagnosis not present

## 2019-02-18 DIAGNOSIS — J9611 Chronic respiratory failure with hypoxia: Secondary | ICD-10-CM | POA: Diagnosis not present

## 2019-02-26 DIAGNOSIS — D649 Anemia, unspecified: Secondary | ICD-10-CM | POA: Diagnosis not present

## 2019-02-27 DIAGNOSIS — R0602 Shortness of breath: Secondary | ICD-10-CM | POA: Diagnosis not present

## 2019-02-27 DIAGNOSIS — H353221 Exudative age-related macular degeneration, left eye, with active choroidal neovascularization: Secondary | ICD-10-CM | POA: Diagnosis not present

## 2019-02-27 DIAGNOSIS — D649 Anemia, unspecified: Secondary | ICD-10-CM | POA: Diagnosis not present

## 2019-02-27 DIAGNOSIS — R531 Weakness: Secondary | ICD-10-CM | POA: Diagnosis not present

## 2019-02-28 DIAGNOSIS — R0602 Shortness of breath: Secondary | ICD-10-CM | POA: Diagnosis not present

## 2019-02-28 DIAGNOSIS — D649 Anemia, unspecified: Secondary | ICD-10-CM | POA: Diagnosis not present

## 2019-02-28 DIAGNOSIS — R531 Weakness: Secondary | ICD-10-CM | POA: Diagnosis not present

## 2019-03-04 DIAGNOSIS — H353211 Exudative age-related macular degeneration, right eye, with active choroidal neovascularization: Secondary | ICD-10-CM | POA: Diagnosis not present

## 2019-03-07 DIAGNOSIS — Z23 Encounter for immunization: Secondary | ICD-10-CM | POA: Diagnosis not present

## 2019-03-13 DIAGNOSIS — D529 Folate deficiency anemia, unspecified: Secondary | ICD-10-CM | POA: Diagnosis not present

## 2019-03-13 DIAGNOSIS — D519 Vitamin B12 deficiency anemia, unspecified: Secondary | ICD-10-CM | POA: Diagnosis not present

## 2019-03-13 DIAGNOSIS — D649 Anemia, unspecified: Secondary | ICD-10-CM | POA: Diagnosis not present

## 2019-03-26 DIAGNOSIS — I5031 Acute diastolic (congestive) heart failure: Secondary | ICD-10-CM | POA: Diagnosis not present

## 2019-03-27 DIAGNOSIS — I5031 Acute diastolic (congestive) heart failure: Secondary | ICD-10-CM | POA: Diagnosis not present

## 2019-03-27 DIAGNOSIS — I340 Nonrheumatic mitral (valve) insufficiency: Secondary | ICD-10-CM | POA: Diagnosis not present

## 2019-03-31 DIAGNOSIS — Z471 Aftercare following joint replacement surgery: Secondary | ICD-10-CM | POA: Diagnosis not present

## 2019-03-31 DIAGNOSIS — Z96651 Presence of right artificial knee joint: Secondary | ICD-10-CM | POA: Diagnosis not present

## 2019-04-07 DIAGNOSIS — Z23 Encounter for immunization: Secondary | ICD-10-CM | POA: Diagnosis not present

## 2019-04-14 DIAGNOSIS — H353211 Exudative age-related macular degeneration, right eye, with active choroidal neovascularization: Secondary | ICD-10-CM | POA: Diagnosis not present

## 2019-04-21 DIAGNOSIS — L570 Actinic keratosis: Secondary | ICD-10-CM | POA: Diagnosis not present

## 2019-04-21 DIAGNOSIS — Z85828 Personal history of other malignant neoplasm of skin: Secondary | ICD-10-CM | POA: Diagnosis not present

## 2019-04-21 DIAGNOSIS — L281 Prurigo nodularis: Secondary | ICD-10-CM | POA: Diagnosis not present

## 2019-04-21 DIAGNOSIS — L280 Lichen simplex chronicus: Secondary | ICD-10-CM | POA: Diagnosis not present

## 2019-04-23 DIAGNOSIS — H353221 Exudative age-related macular degeneration, left eye, with active choroidal neovascularization: Secondary | ICD-10-CM | POA: Diagnosis not present

## 2019-05-13 DIAGNOSIS — N183 Chronic kidney disease, stage 3 unspecified: Secondary | ICD-10-CM | POA: Diagnosis not present

## 2019-05-13 DIAGNOSIS — K2100 Gastro-esophageal reflux disease with esophagitis, without bleeding: Secondary | ICD-10-CM | POA: Diagnosis not present

## 2019-05-13 DIAGNOSIS — E1122 Type 2 diabetes mellitus with diabetic chronic kidney disease: Secondary | ICD-10-CM | POA: Diagnosis not present

## 2019-05-13 DIAGNOSIS — E782 Mixed hyperlipidemia: Secondary | ICD-10-CM | POA: Diagnosis not present

## 2019-05-13 DIAGNOSIS — I10 Essential (primary) hypertension: Secondary | ICD-10-CM | POA: Diagnosis not present

## 2019-05-19 DIAGNOSIS — I10 Essential (primary) hypertension: Secondary | ICD-10-CM | POA: Diagnosis not present

## 2019-05-19 DIAGNOSIS — E782 Mixed hyperlipidemia: Secondary | ICD-10-CM | POA: Diagnosis not present

## 2019-05-19 DIAGNOSIS — K219 Gastro-esophageal reflux disease without esophagitis: Secondary | ICD-10-CM | POA: Diagnosis not present

## 2019-05-19 DIAGNOSIS — E039 Hypothyroidism, unspecified: Secondary | ICD-10-CM | POA: Diagnosis not present

## 2019-05-19 DIAGNOSIS — D500 Iron deficiency anemia secondary to blood loss (chronic): Secondary | ICD-10-CM | POA: Diagnosis not present

## 2019-05-19 DIAGNOSIS — E1122 Type 2 diabetes mellitus with diabetic chronic kidney disease: Secondary | ICD-10-CM | POA: Diagnosis not present

## 2019-05-19 DIAGNOSIS — Z1212 Encounter for screening for malignant neoplasm of rectum: Secondary | ICD-10-CM | POA: Diagnosis not present

## 2019-05-19 DIAGNOSIS — J9611 Chronic respiratory failure with hypoxia: Secondary | ICD-10-CM | POA: Diagnosis not present

## 2019-05-19 DIAGNOSIS — N184 Chronic kidney disease, stage 4 (severe): Secondary | ICD-10-CM | POA: Diagnosis not present

## 2019-05-19 DIAGNOSIS — J449 Chronic obstructive pulmonary disease, unspecified: Secondary | ICD-10-CM | POA: Diagnosis not present

## 2019-05-19 DIAGNOSIS — I714 Abdominal aortic aneurysm, without rupture: Secondary | ICD-10-CM | POA: Diagnosis not present

## 2019-05-19 DIAGNOSIS — F331 Major depressive disorder, recurrent, moderate: Secondary | ICD-10-CM | POA: Diagnosis not present

## 2019-05-22 DIAGNOSIS — H353211 Exudative age-related macular degeneration, right eye, with active choroidal neovascularization: Secondary | ICD-10-CM | POA: Diagnosis not present

## 2019-06-04 DIAGNOSIS — H04123 Dry eye syndrome of bilateral lacrimal glands: Secondary | ICD-10-CM | POA: Diagnosis not present

## 2019-06-04 DIAGNOSIS — H353221 Exudative age-related macular degeneration, left eye, with active choroidal neovascularization: Secondary | ICD-10-CM | POA: Diagnosis not present

## 2019-06-04 DIAGNOSIS — H18511 Endothelial corneal dystrophy, right eye: Secondary | ICD-10-CM | POA: Diagnosis not present

## 2019-06-04 DIAGNOSIS — H353211 Exudative age-related macular degeneration, right eye, with active choroidal neovascularization: Secondary | ICD-10-CM | POA: Diagnosis not present

## 2019-06-05 DIAGNOSIS — J329 Chronic sinusitis, unspecified: Secondary | ICD-10-CM | POA: Diagnosis not present

## 2019-06-17 DIAGNOSIS — Z85828 Personal history of other malignant neoplasm of skin: Secondary | ICD-10-CM | POA: Diagnosis not present

## 2019-06-17 DIAGNOSIS — L509 Urticaria, unspecified: Secondary | ICD-10-CM | POA: Diagnosis not present

## 2019-06-24 DIAGNOSIS — H353211 Exudative age-related macular degeneration, right eye, with active choroidal neovascularization: Secondary | ICD-10-CM | POA: Diagnosis not present

## 2019-07-09 DIAGNOSIS — H20011 Primary iridocyclitis, right eye: Secondary | ICD-10-CM | POA: Diagnosis not present

## 2019-07-09 DIAGNOSIS — H53141 Visual discomfort, right eye: Secondary | ICD-10-CM | POA: Diagnosis not present

## 2019-07-09 DIAGNOSIS — H5711 Ocular pain, right eye: Secondary | ICD-10-CM | POA: Diagnosis not present

## 2019-07-16 DIAGNOSIS — H353221 Exudative age-related macular degeneration, left eye, with active choroidal neovascularization: Secondary | ICD-10-CM | POA: Diagnosis not present

## 2019-09-03 DIAGNOSIS — H353221 Exudative age-related macular degeneration, left eye, with active choroidal neovascularization: Secondary | ICD-10-CM | POA: Diagnosis not present

## 2019-09-24 DIAGNOSIS — H353211 Exudative age-related macular degeneration, right eye, with active choroidal neovascularization: Secondary | ICD-10-CM | POA: Diagnosis not present

## 2019-09-29 DIAGNOSIS — L280 Lichen simplex chronicus: Secondary | ICD-10-CM | POA: Diagnosis not present

## 2019-10-10 DIAGNOSIS — J441 Chronic obstructive pulmonary disease with (acute) exacerbation: Secondary | ICD-10-CM | POA: Diagnosis not present

## 2019-10-10 DIAGNOSIS — J329 Chronic sinusitis, unspecified: Secondary | ICD-10-CM | POA: Diagnosis not present

## 2019-10-12 ENCOUNTER — Emergency Department (HOSPITAL_COMMUNITY): Payer: Medicare Other

## 2019-10-12 ENCOUNTER — Inpatient Hospital Stay (HOSPITAL_COMMUNITY)
Admission: EM | Admit: 2019-10-12 | Discharge: 2019-10-17 | DRG: 481 | Disposition: A | Discharge status: Skilled Nursing Facility | Payer: Medicare Other | Attending: Internal Medicine | Admitting: Internal Medicine

## 2019-10-12 ENCOUNTER — Encounter (HOSPITAL_COMMUNITY): Payer: Self-pay | Admitting: Emergency Medicine

## 2019-10-12 ENCOUNTER — Other Ambulatory Visit: Payer: Self-pay

## 2019-10-12 DIAGNOSIS — D62 Acute posthemorrhagic anemia: Secondary | ICD-10-CM | POA: Diagnosis not present

## 2019-10-12 DIAGNOSIS — S79929A Unspecified injury of unspecified thigh, initial encounter: Secondary | ICD-10-CM | POA: Diagnosis not present

## 2019-10-12 DIAGNOSIS — M62838 Other muscle spasm: Secondary | ICD-10-CM | POA: Diagnosis not present

## 2019-10-12 DIAGNOSIS — K219 Gastro-esophageal reflux disease without esophagitis: Secondary | ICD-10-CM | POA: Diagnosis present

## 2019-10-12 DIAGNOSIS — Z79899 Other long term (current) drug therapy: Secondary | ICD-10-CM | POA: Diagnosis not present

## 2019-10-12 DIAGNOSIS — E1165 Type 2 diabetes mellitus with hyperglycemia: Secondary | ICD-10-CM | POA: Diagnosis present

## 2019-10-12 DIAGNOSIS — Z9981 Dependence on supplemental oxygen: Secondary | ICD-10-CM

## 2019-10-12 DIAGNOSIS — W19XXXA Unspecified fall, initial encounter: Secondary | ICD-10-CM | POA: Diagnosis not present

## 2019-10-12 DIAGNOSIS — M1990 Unspecified osteoarthritis, unspecified site: Secondary | ICD-10-CM | POA: Diagnosis present

## 2019-10-12 DIAGNOSIS — S72144D Nondisplaced intertrochanteric fracture of right femur, subsequent encounter for closed fracture with routine healing: Secondary | ICD-10-CM | POA: Diagnosis not present

## 2019-10-12 DIAGNOSIS — Z66 Do not resuscitate: Secondary | ICD-10-CM | POA: Diagnosis present

## 2019-10-12 DIAGNOSIS — S72121A Displaced fracture of lesser trochanter of right femur, initial encounter for closed fracture: Secondary | ICD-10-CM | POA: Diagnosis not present

## 2019-10-12 DIAGNOSIS — Z91012 Allergy to eggs: Secondary | ICD-10-CM

## 2019-10-12 DIAGNOSIS — H3530 Unspecified macular degeneration: Secondary | ICD-10-CM | POA: Diagnosis present

## 2019-10-12 DIAGNOSIS — Z96651 Presence of right artificial knee joint: Secondary | ICD-10-CM | POA: Diagnosis present

## 2019-10-12 DIAGNOSIS — R2689 Other abnormalities of gait and mobility: Secondary | ICD-10-CM | POA: Diagnosis not present

## 2019-10-12 DIAGNOSIS — Z885 Allergy status to narcotic agent status: Secondary | ICD-10-CM

## 2019-10-12 DIAGNOSIS — Z90710 Acquired absence of both cervix and uterus: Secondary | ICD-10-CM

## 2019-10-12 DIAGNOSIS — I10 Essential (primary) hypertension: Secondary | ICD-10-CM | POA: Diagnosis not present

## 2019-10-12 DIAGNOSIS — F419 Anxiety disorder, unspecified: Secondary | ICD-10-CM | POA: Diagnosis present

## 2019-10-12 DIAGNOSIS — E039 Hypothyroidism, unspecified: Secondary | ICD-10-CM | POA: Diagnosis not present

## 2019-10-12 DIAGNOSIS — S72144A Nondisplaced intertrochanteric fracture of right femur, initial encounter for closed fracture: Secondary | ICD-10-CM | POA: Diagnosis not present

## 2019-10-12 DIAGNOSIS — Z79890 Hormone replacement therapy (postmenopausal): Secondary | ICD-10-CM

## 2019-10-12 DIAGNOSIS — E1129 Type 2 diabetes mellitus with other diabetic kidney complication: Secondary | ICD-10-CM | POA: Diagnosis present

## 2019-10-12 DIAGNOSIS — D631 Anemia in chronic kidney disease: Secondary | ICD-10-CM | POA: Diagnosis present

## 2019-10-12 DIAGNOSIS — M2550 Pain in unspecified joint: Secondary | ICD-10-CM | POA: Diagnosis not present

## 2019-10-12 DIAGNOSIS — E785 Hyperlipidemia, unspecified: Secondary | ICD-10-CM | POA: Diagnosis present

## 2019-10-12 DIAGNOSIS — W010XXA Fall on same level from slipping, tripping and stumbling without subsequent striking against object, initial encounter: Secondary | ICD-10-CM | POA: Diagnosis present

## 2019-10-12 DIAGNOSIS — I130 Hypertensive heart and chronic kidney disease with heart failure and stage 1 through stage 4 chronic kidney disease, or unspecified chronic kidney disease: Secondary | ICD-10-CM | POA: Diagnosis present

## 2019-10-12 DIAGNOSIS — M25551 Pain in right hip: Secondary | ICD-10-CM | POA: Diagnosis not present

## 2019-10-12 DIAGNOSIS — Z888 Allergy status to other drugs, medicaments and biological substances status: Secondary | ICD-10-CM

## 2019-10-12 DIAGNOSIS — Z8673 Personal history of transient ischemic attack (TIA), and cerebral infarction without residual deficits: Secondary | ICD-10-CM | POA: Diagnosis not present

## 2019-10-12 DIAGNOSIS — S79911A Unspecified injury of right hip, initial encounter: Secondary | ICD-10-CM | POA: Diagnosis not present

## 2019-10-12 DIAGNOSIS — E875 Hyperkalemia: Secondary | ICD-10-CM | POA: Diagnosis not present

## 2019-10-12 DIAGNOSIS — I959 Hypotension, unspecified: Secondary | ICD-10-CM | POA: Diagnosis not present

## 2019-10-12 DIAGNOSIS — Z20822 Contact with and (suspected) exposure to covid-19: Secondary | ICD-10-CM | POA: Diagnosis present

## 2019-10-12 DIAGNOSIS — M6281 Muscle weakness (generalized): Secondary | ICD-10-CM | POA: Diagnosis not present

## 2019-10-12 DIAGNOSIS — I5032 Chronic diastolic (congestive) heart failure: Secondary | ICD-10-CM | POA: Diagnosis present

## 2019-10-12 DIAGNOSIS — J9811 Atelectasis: Secondary | ICD-10-CM | POA: Diagnosis not present

## 2019-10-12 DIAGNOSIS — R41841 Cognitive communication deficit: Secondary | ICD-10-CM | POA: Diagnosis not present

## 2019-10-12 DIAGNOSIS — R52 Pain, unspecified: Secondary | ICD-10-CM | POA: Diagnosis not present

## 2019-10-12 DIAGNOSIS — N179 Acute kidney failure, unspecified: Secondary | ICD-10-CM | POA: Diagnosis not present

## 2019-10-12 DIAGNOSIS — N183 Chronic kidney disease, stage 3 unspecified: Secondary | ICD-10-CM | POA: Diagnosis not present

## 2019-10-12 DIAGNOSIS — J9611 Chronic respiratory failure with hypoxia: Secondary | ICD-10-CM | POA: Diagnosis not present

## 2019-10-12 DIAGNOSIS — J449 Chronic obstructive pulmonary disease, unspecified: Secondary | ICD-10-CM | POA: Diagnosis present

## 2019-10-12 DIAGNOSIS — Z880 Allergy status to penicillin: Secondary | ICD-10-CM

## 2019-10-12 DIAGNOSIS — I110 Hypertensive heart disease with heart failure: Secondary | ICD-10-CM | POA: Diagnosis not present

## 2019-10-12 DIAGNOSIS — S72141A Displaced intertrochanteric fracture of right femur, initial encounter for closed fracture: Secondary | ICD-10-CM

## 2019-10-12 DIAGNOSIS — Z882 Allergy status to sulfonamides status: Secondary | ICD-10-CM

## 2019-10-12 DIAGNOSIS — N184 Chronic kidney disease, stage 4 (severe): Secondary | ICD-10-CM | POA: Diagnosis not present

## 2019-10-12 DIAGNOSIS — M25572 Pain in left ankle and joints of left foot: Secondary | ICD-10-CM | POA: Diagnosis not present

## 2019-10-12 DIAGNOSIS — Z7401 Bed confinement status: Secondary | ICD-10-CM | POA: Diagnosis not present

## 2019-10-12 DIAGNOSIS — Z9181 History of falling: Secondary | ICD-10-CM | POA: Diagnosis not present

## 2019-10-12 DIAGNOSIS — S72001D Fracture of unspecified part of neck of right femur, subsequent encounter for closed fracture with routine healing: Secondary | ICD-10-CM | POA: Diagnosis not present

## 2019-10-12 DIAGNOSIS — Z419 Encounter for procedure for purposes other than remedying health state, unspecified: Secondary | ICD-10-CM | POA: Diagnosis not present

## 2019-10-12 DIAGNOSIS — E1122 Type 2 diabetes mellitus with diabetic chronic kidney disease: Secondary | ICD-10-CM | POA: Diagnosis not present

## 2019-10-12 DIAGNOSIS — F411 Generalized anxiety disorder: Secondary | ICD-10-CM | POA: Diagnosis not present

## 2019-10-12 LAB — BASIC METABOLIC PANEL
Anion gap: 12 (ref 5–15)
BUN: 30 mg/dL — ABNORMAL HIGH (ref 8–23)
CO2: 21 mmol/L — ABNORMAL LOW (ref 22–32)
Calcium: 8.8 mg/dL — ABNORMAL LOW (ref 8.9–10.3)
Chloride: 102 mmol/L (ref 98–111)
Creatinine, Ser: 1.72 mg/dL — ABNORMAL HIGH (ref 0.44–1.00)
GFR calc Af Amer: 30 mL/min — ABNORMAL LOW (ref >60)
GFR calc non Af Amer: 26 mL/min — ABNORMAL LOW (ref >60)
Glucose, Bld: 181 mg/dL — ABNORMAL HIGH (ref 70–99)
Potassium: 4.9 mmol/L (ref 3.5–5.1)
Sodium: 135 mmol/L (ref 135–145)

## 2019-10-12 LAB — PROTIME-INR
INR: 1.1 (ref 0.8–1.2)
Prothrombin Time: 13.4 seconds (ref 11.4–15.2)

## 2019-10-12 LAB — CBC WITH DIFFERENTIAL/PLATELET
Abs Immature Granulocytes: 0.03 10*3/uL (ref 0.00–0.07)
Basophils Absolute: 0 10*3/uL (ref 0.0–0.1)
Basophils Relative: 1 %
Eosinophils Absolute: 0.2 10*3/uL (ref 0.0–0.5)
Eosinophils Relative: 4 %
HCT: 30.3 % — ABNORMAL LOW (ref 36.0–46.0)
Hemoglobin: 8.2 g/dL — ABNORMAL LOW (ref 12.0–15.0)
Immature Granulocytes: 1 %
Lymphocytes Relative: 33 %
Lymphs Abs: 1.9 10*3/uL (ref 0.7–4.0)
MCH: 19 pg — ABNORMAL LOW (ref 26.0–34.0)
MCHC: 27.1 g/dL — ABNORMAL LOW (ref 30.0–36.0)
MCV: 70.1 fL — ABNORMAL LOW (ref 80.0–100.0)
Monocytes Absolute: 0.6 10*3/uL (ref 0.1–1.0)
Monocytes Relative: 11 %
Neutro Abs: 2.9 10*3/uL (ref 1.7–7.7)
Neutrophils Relative %: 50 %
Platelets: 159 10*3/uL (ref 150–400)
RBC: 4.32 MIL/uL (ref 3.87–5.11)
RDW: 20.6 % — ABNORMAL HIGH (ref 11.5–15.5)
WBC: 5.6 10*3/uL (ref 4.0–10.5)
nRBC: 0 % (ref 0.0–0.2)

## 2019-10-12 LAB — SARS CORONAVIRUS 2 BY RT PCR (HOSPITAL ORDER, PERFORMED IN ~~LOC~~ HOSPITAL LAB): SARS Coronavirus 2: NEGATIVE

## 2019-10-12 MED ORDER — HYDROMORPHONE HCL 1 MG/ML IJ SOLN
0.5000 mg | INTRAMUSCULAR | Status: DC | PRN
  Administered 2019-10-13: 0.5 mg via INTRAVENOUS
  Filled 2019-10-12: qty 0.5

## 2019-10-12 MED ORDER — SODIUM CHLORIDE 0.45 % IV SOLN: INTRAVENOUS | Status: AC

## 2019-10-12 MED ORDER — ONDANSETRON HCL 4 MG/2ML IJ SOLN
4.0000 mg | Freq: Four times a day (QID) | INTRAMUSCULAR | Status: DC | PRN
  Administered 2019-10-12: 4 mg via INTRAVENOUS
  Filled 2019-10-12: qty 2

## 2019-10-12 MED ORDER — VITAMIN D 25 MCG (1000 UNIT) PO TABS
2000.0000 [IU] | ORAL_TABLET | Freq: Every day | ORAL | Status: DC
  Administered 2019-10-13 – 2019-10-17 (×5): 2000 [IU] via ORAL
  Filled 2019-10-12 (×5): qty 2

## 2019-10-12 MED ORDER — HEPARIN SODIUM (PORCINE) 5000 UNIT/ML IJ SOLN: 5000.0000 [IU] | Freq: Three times a day (TID) | INTRAMUSCULAR | Status: DC

## 2019-10-12 MED ORDER — FUROSEMIDE 40 MG PO TABS
40.0000 mg | ORAL_TABLET | Freq: Every day | ORAL | Status: DC
  Administered 2019-10-13 – 2019-10-15 (×3): 40 mg via ORAL
  Filled 2019-10-12 (×3): qty 1

## 2019-10-12 MED ORDER — VITAMIN B-12 1000 MCG PO TABS
1000.0000 ug | ORAL_TABLET | Freq: Every day | ORAL | Status: DC
  Administered 2019-10-13 – 2019-10-17 (×5): 1000 ug via ORAL
  Filled 2019-10-12 (×5): qty 1

## 2019-10-12 MED ORDER — INSULIN ASPART 100 UNIT/ML ~~LOC~~ SOLN
0.0000 [IU] | Freq: Three times a day (TID) | SUBCUTANEOUS | Status: DC
  Administered 2019-10-13 – 2019-10-14 (×2): 2 [IU] via SUBCUTANEOUS
  Administered 2019-10-14 – 2019-10-15 (×2): 3 [IU] via SUBCUTANEOUS
  Administered 2019-10-15 – 2019-10-16 (×2): 2 [IU] via SUBCUTANEOUS
  Administered 2019-10-16: 3 [IU] via SUBCUTANEOUS
  Administered 2019-10-16 – 2019-10-17 (×3): 2 [IU] via SUBCUTANEOUS
  Filled 2019-10-12: qty 1

## 2019-10-12 MED ORDER — METHOCARBAMOL 500 MG PO TABS
500.0000 mg | ORAL_TABLET | Freq: Four times a day (QID) | ORAL | Status: DC | PRN
  Administered 2019-10-13 – 2019-10-14 (×4): 500 mg via ORAL
  Filled 2019-10-12 (×5): qty 1

## 2019-10-12 MED ORDER — GABAPENTIN 300 MG PO CAPS
300.0000 mg | ORAL_CAPSULE | Freq: Every day | ORAL | Status: DC
  Administered 2019-10-13 – 2019-10-14 (×2): 300 mg via ORAL
  Filled 2019-10-12 (×2): qty 1

## 2019-10-12 MED ORDER — ASPIRIN EC 81 MG PO TBEC: 81.0000 mg | DELAYED_RELEASE_TABLET | Freq: Every day | ORAL | Status: DC

## 2019-10-12 MED ORDER — LORATADINE 10 MG PO TABS
10.0000 mg | ORAL_TABLET | Freq: Every day | ORAL | Status: DC
  Administered 2019-10-12 – 2019-10-17 (×6): 10 mg via ORAL
  Filled 2019-10-12 (×6): qty 1

## 2019-10-12 MED ORDER — ACETAMINOPHEN 500 MG PO TABS
1000.0000 mg | ORAL_TABLET | Freq: Three times a day (TID) | ORAL | Status: DC
  Administered 2019-10-13 – 2019-10-17 (×12): 1000 mg via ORAL
  Filled 2019-10-12 (×12): qty 2

## 2019-10-12 MED ORDER — LEVOTHYROXINE SODIUM 112 MCG PO TABS
112.0000 ug | ORAL_TABLET | Freq: Every day | ORAL | Status: DC
  Administered 2019-10-13 – 2019-10-17 (×5): 112 ug via ORAL
  Filled 2019-10-12 (×8): qty 1

## 2019-10-12 MED ORDER — ALPRAZOLAM 0.5 MG PO TABS
1.0000 mg | ORAL_TABLET | Freq: Every day | ORAL | Status: DC
  Administered 2019-10-13 – 2019-10-16 (×4): 1 mg via ORAL
  Filled 2019-10-12 (×4): qty 2

## 2019-10-12 MED ORDER — METHOCARBAMOL 1000 MG/10ML IJ SOLN
500.0000 mg | Freq: Four times a day (QID) | INTRAVENOUS | Status: DC | PRN
  Filled 2019-10-12: qty 5

## 2019-10-12 MED ORDER — OFLOXACIN 0.3 % OP SOLN
1.0000 [drp] | OPHTHALMIC | Status: DC
  Filled 2019-10-12: qty 5

## 2019-10-12 MED ORDER — PANTOPRAZOLE SODIUM 20 MG PO TBEC
20.0000 mg | DELAYED_RELEASE_TABLET | Freq: Every day | ORAL | Status: DC
  Filled 2019-10-12: qty 1

## 2019-10-12 MED ORDER — CLONIDINE HCL 0.2 MG PO TABS
0.2000 mg | ORAL_TABLET | Freq: Once | ORAL | Status: AC
  Administered 2019-10-12: 0.2 mg via ORAL
  Filled 2019-10-12: qty 1

## 2019-10-12 MED ORDER — INSULIN ASPART 100 UNIT/ML ~~LOC~~ SOLN: 0.0000 [IU] | Freq: Three times a day (TID) | SUBCUTANEOUS | Status: DC

## 2019-10-12 MED ORDER — NEOMYCIN-POLYMYXIN-DEXAMETH 0.1 % OP OINT
1.0000 "application " | TOPICAL_OINTMENT | Freq: Three times a day (TID) | OPHTHALMIC | Status: DC
  Filled 2019-10-12: qty 3.5

## 2019-10-12 MED ORDER — LUTEIN-ZEAXANTHIN 25-5 MG PO CAPS: 1.0000 | ORAL_CAPSULE | Freq: Every day | ORAL | Status: DC

## 2019-10-12 MED ORDER — ATORVASTATIN CALCIUM 40 MG PO TABS
40.0000 mg | ORAL_TABLET | Freq: Every day | ORAL | Status: DC
  Administered 2019-10-13 – 2019-10-17 (×5): 40 mg via ORAL
  Filled 2019-10-12 (×5): qty 1

## 2019-10-12 MED ORDER — SODIUM CHLORIDE 0.9 % IV SOLN: INTRAVENOUS | Status: DC

## 2019-10-12 MED ORDER — IRBESARTAN 150 MG PO TABS
300.0000 mg | ORAL_TABLET | Freq: Every day | ORAL | Status: DC
  Administered 2019-10-13 – 2019-10-15 (×3): 300 mg via ORAL
  Filled 2019-10-12: qty 1
  Filled 2019-10-12 (×4): qty 2

## 2019-10-12 MED ORDER — SENNA 8.6 MG PO TABS
1.0000 | ORAL_TABLET | Freq: Two times a day (BID) | ORAL | Status: DC
  Administered 2019-10-13 – 2019-10-17 (×8): 8.6 mg via ORAL
  Filled 2019-10-12 (×14): qty 1

## 2019-10-12 MED ORDER — FENTANYL CITRATE (PF) 100 MCG/2ML IJ SOLN
50.0000 ug | INTRAMUSCULAR | Status: AC | PRN
  Administered 2019-10-12 – 2019-10-13 (×2): 50 ug via INTRAVENOUS
  Filled 2019-10-12 (×2): qty 2

## 2019-10-12 NOTE — Progress Notes (Signed)
Consult received for R IT femur fx s/p GLF. Hgb 8.2. Request hospitalist admit at [facility] hospital. Needs PRBCs in preparation for surgery.  Fracture requires surgical fixation.

## 2019-10-12 NOTE — ED Provider Notes (Signed)
[facility] Provider Note   CSN: [id number] Arrival date & time: 10/12/19  1629     History No chief complaint on file.   Catherine Rich is a 86 y.o. female.  HPI   This patient is an 86 year old female, she has a known history of diabetes, acid reflux, chronic kidney disease, stroke.  She has had a total knee replacement on the right.  She reports that she was at home, she bent over to pick up a cracker when she lost her balance and fell striking her right hip.  She was unable to get up off of the ground, thankfully her family member was in the house and was able to help her, paramedics were called and noted the patient to be unable to get up, unable to move her right hip without pain, a slight deformity of the right lower extremity.  She denies hitting her head, this occurred just prior to arrival, it was acute in onset, symptoms are persistent  Past Medical History:  Diagnosis Date  . Anemia   . Arthritis   . Chronic kidney disease    only one funtioning kidney  . Complication of anesthesia   . Diabetes mellitus without complication ([place])   . GERD (gastroesophageal reflux disease)   . Hypertension   . Hypothyroidism   . Macular degeneration, bilateral   . PONV (postoperative nausea and vomiting)   . Stroke [facility]) 01/19/2011    Patient Active Problem List   Diagnosis Date Noted  . Osteoarthritis of right knee 08/02/2018  . S/P TKR (total knee replacement) using cement, right 08/02/2018    Past Surgical History:  Procedure Laterality Date  . ABDOMINAL HYSTERECTOMY  1970  . BACK SURGERY  01/02/2018   L4-L5   . EYE SURGERY    . TOTAL KNEE ARTHROPLASTY Right 08/02/2018   Procedure: TOTAL KNEE ARTHROPLASTY;  Surgeon: Sydnee Cabal, MD;  Location: [facility];  Service: Orthopedics;  Laterality: Right;  with adductor canal  . VASCULAR SURGERY  11/2013   AAA     OB History   No obstetric history on file.     No family history on file.  Social  History   Tobacco Use  . Smoking status: Never Smoker  . Smokeless tobacco: Never Used  Substance Use Topics  . Alcohol use: Never  . Drug use: Never    Home Medications Prior to Admission medications   Medication Sig Start Date End Date Taking? Authorizing Provider  ALPRAZolam Duanne Moron) 0.5 MG tablet Take 1 mg by mouth at bedtime.    [provider]  amLODipine (NORVASC) 10 MG tablet Take 10 mg by mouth daily.    [provider]  atorvastatin (LIPITOR) 40 MG tablet Take 40 mg by mouth daily.    [provider]  Cholecalciferol (VITAMIN D) 50 MCG (2000 UT) tablet Take 2,000 Units by mouth daily.    [provider]  cloNIDine (CATAPRES) 0.1 MG tablet Take 0.1 mg by mouth every evening.    [provider]  gabapentin (NEURONTIN) 300 MG capsule Take 300-600 mg by mouth at bedtime. DEPENDING ON PAIN , PT MAY TAKE 300 MG OR 600 MG AT BEDTIME    [provider]  ketotifen (ZADITOR) 0.025 % ophthalmic solution Place 1 drop into both eyes 2 (two) times daily as needed.    [provider]  levothyroxine (SYNTHROID) 112 MCG tablet Take 112 mcg by mouth daily before breakfast.    [provider]  loratadine (CLARITIN) 10 MG tablet Take 10 mg by mouth daily.    [provider]  Lutein-Zeaxanthin 25-5 MG CAPS Take 1 capsule by mouth daily.    [provider]  Multiple Vitamins-Minerals (MULTIVITAMIN WITH MINERALS) tablet Take 1 tablet by mouth daily.    [provider]  neomycin-polymyxin-dexameth (MAXITROL) 0.1 % OINT Place 1 application into both eyes 3 (three) times daily.    [provider]  ofloxacin (OCUFLOX) 0.3 % ophthalmic solution Place 1 drop into both eyes See admin instructions. Pt to instill 1 drop in operative eye three times daily  2 days before and 3 days after eye injection    [provider]  pantoprazole (PROTONIX) 20 MG tablet Take 20 mg by mouth daily.    [provider]  prednisoLONE acetate (PRED FORTE) 1 % ophthalmic suspension Place 1 drop into both eyes See admin instructions. Place one drop in both eyes four times daily for two weeks, then place one drop in both eyes twice daily for 4 weeks.    [provider]  vitamin B-12 (CYANOCOBALAMIN) 1000 MCG tablet Take 1,000 mcg by mouth daily.    [provider]  vitamin C (ASCORBIC ACID) 500 MG tablet Take 500 mg by mouth daily.    [provider]    Allergies    Penicillins, Betadine [povidone iodine], Eggs or egg-derived products, Other, Codeine, and Sulfa antibiotics  Review of Systems   Review of Systems  All other systems reviewed and are negative.   Physical Exam Updated Vital Signs There were no vitals taken for this visit.  Physical Exam Vitals and nursing note reviewed.  Constitutional:      General: She is not in acute distress.    Appearance: She is well-developed.  HENT:     Head: Normocephalic and atraumatic.     Mouth/Throat:     Pharynx: No oropharyngeal exudate.  Eyes:     General: No scleral icterus.       Right eye: No discharge.        Left eye: No discharge.     Conjunctiva/sclera: Conjunctivae normal.     Pupils: Pupils are equal, round, and reactive to light.  Neck:     Thyroid: No thyromegaly.     Vascular: No JVD.  Cardiovascular:     Rate and Rhythm: Normal rate and regular rhythm.     Heart sounds: Normal heart sounds. No murmur heard.  No friction rub. No gallop.   Pulmonary:     Effort: Pulmonary effort is normal. No respiratory distress.     Breath sounds: Normal breath sounds. No wheezing or rales.  Abdominal:     General: Bowel sounds are normal. There is no distension.     Palpations: Abdomen is soft. There is no mass.     Tenderness: There is no abdominal tenderness.  Musculoskeletal:        General: Tenderness and deformity present.     Cervical back: Normal range of motion and neck supple.     Right lower  leg: No edema.     Left lower leg: No edema.     Comments: The patient's right lower extremity has significant pain with any range of motion of the right hip.  She is able to move her knee and ankle.  This is painful with both internal and external rotation as well as with flexion.  Lymphadenopathy:     Cervical: No cervical adenopathy.  Skin:  General: Skin is warm and dry.     Findings: No erythema or rash.  Neurological:     Mental Status: She is alert.     Coordination: Coordination normal.  Psychiatric:        Behavior: Behavior normal.     ED Results / Procedures / Treatments   Labs (all labs ordered are listed, but only abnormal results are displayed) Labs Reviewed  BASIC METABOLIC PANEL - Abnormal; Notable for the following components:      Result Value   CO2 21 (*)    Glucose, Bld 181 (*)    BUN 30 (*)    Creatinine, Ser 1.72 (*)    Calcium 8.8 (*)    GFR calc non Af Amer 26 (*)    GFR calc Af Amer 30 (*)    All other components within normal limits  CBC WITH DIFFERENTIAL/PLATELET - Abnormal; Notable for the following components:   Hemoglobin 8.2 (*)    HCT 30.3 (*)    MCV 70.1 (*)    MCH 19.0 (*)    MCHC 27.1 (*)    RDW 20.6 (*)    All other components within normal limits  PROTIME-INR  TYPE AND SCREEN    EKG EKG Interpretation  Date/Time:  Sunday October 12 2019 17:42:00 EDT Ventricular Rate:  78 PR Interval:    QRS Duration: 93 QT Interval:  405 QTC Calculation: 462 R Axis:   86 Text Interpretation: Sinus rhythm Anteroseptal infarct, age indeterminate Baseline wander in lead(s) V1 Confirmed by Noemi Chapel [phone]) on 10/12/2019 6:37:37 PM   Radiology DG Chest Port 1 View  Result Date: 10/12/2019 CLINICAL DATA:  Pt states she fell back and landed on floor earlier today. Pt c/o right leg pain EXAM: PORTABLE CHEST 1 VIEW COMPARISON:  Chest radiograph 02/08/2019 FINDINGS: Stable cardiomediastinal contours with aortic stent graft in place. Enlarged  heart size. There are scattered bibasilar opacities likely atelectasis or scarring. No pneumothorax or large pleural effusion. No acute finding in the visualized skeleton. IMPRESSION: Scattered bibasilar opacities likely reflecting atelectasis or scarring. Electronically Signed   By: Audie Pinto M.D.   On: 10/12/2019 17:38   DG Hip Unilat With Pelvis 2-3 Views Right  Result Date: 10/12/2019 CLINICAL DATA:  [place] [address] backwards landing on floor earlier today, RIGHT hip pain EXAM: DG HIP (WITH OR WITHOUT PELVIS) 2-3V RIGHT COMPARISON:  None FINDINGS: Osseous demineralization. SI joints poorly visualized. Mild narrowing of the hip joints bilaterally. Mildly displaced lesser trochanteric fracture. Lucency in the intertrochanteric region of the proximal RIGHT femur, suspicious for nondisplaced intertrochanteric fracture. A portion of the fracture plane is evident on the shoot through lateral view but the superolateral portion of the fracture line is not clearly delineated. No dislocation or pelvic fracture seen. IMPRESSION: Suspected nondisplaced intertrochanteric fracture of the RIGHT femur with a mildly displaced lesser trochanteric fracture fragment. The superolateral extent of the fracture is not well demonstrated; this could be better delineated by CT. Electronically Signed   By: Lavonia Dana M.D.   On: 10/12/2019 17:42    Procedures Procedures (including critical care time)  Medications Ordered in ED Medications  0.9 %  sodium chloride infusion ( Intravenous New Bag/Given 10/12/19 1750)  ondansetron (ZOFRAN) injection 4 mg (4 mg Intravenous Given 10/12/19 1748)  fentaNYL (SUBLIMAZE) injection 50 mcg (50 mcg Intravenous Given 10/12/19 1749)  cloNIDine (CATAPRES) tablet 0.2 mg (0.2 mg Oral Given 10/12/19 1842)    ED Course  I have reviewed the triage vital signs  and the nursing notes.  Pertinent labs & imaging results that were available during my care of the patient were reviewed by me and considered  in my medical decision making (see chart for details).  Clinical Course as of Oct 11 1852  Sun Oct 11, 2001  7944 Metabolic panel compared to prior studies, creatinine seems to be baseline, CBC shows no leukocytosis but the patient is anemic to 8.2, this is lower than a year ago and it was 9.9, her blood pressure is elevated at 205/77, will give some blood pressure medication and consult with hospitalist and orthopedist    [BM]    Clinical Course User Index [BM] Noemi Chapel, MD   MDM Rules/Calculators/A&P                          The patient has a focal right lower extremity injury with a suspected hip fracture.  Will obtain imaging, labs, preop chest x-ray and EKG.  The patient will be given some pain medication, n.p.o., otherwise the patient is not anticoagulated though she does take some aspirin.  Discussed the care with hospitalist, Dr. Linda Hedges, he will facilitate admission to the hospital.  Will call orthopedics to determine timing on repair  D/w Ortho as well as hospitalist -  For admisison  Final Clinical Impression(s) / ED Diagnoses Final diagnoses:  Closed nondisplaced intertrochanteric fracture of right femur, initial encounter [facility])  Essential hypertension      Noemi Chapel, MD 10/18/19 1439

## 2019-10-12 NOTE — Progress Notes (Signed)
PHARMACIST - PHYSICIAN ORDER COMMUNICATION  CONCERNING: P&T Medication Policy on Herbal Medications  DESCRIPTION:  This patient's order for:  Lutein-Zeaxanthin 25-5 MG CAPS has been noted.  This product(s) is classified as an "herbal" or natural product. Due to a lack of definitive safety studies or FDA approval, nonstandard manufacturing practices, plus the potential risk of unknown drug-drug interactions while on inpatient medications, the Pharmacy and Therapeutics Committee does not permit the use of "herbal" or natural products of this type within [facility].   ACTION TAKEN: The pharmacy department is unable to verify this order at this time and your patient has been informed of this safety policy. Please reevaluate patient's clinical condition at discharge and address if the herbal or natural product(s) should be resumed at that time.

## 2019-10-12 NOTE — ED Triage Notes (Signed)
Pt c/o of leg pain

## 2019-10-12 NOTE — H&P (Signed)
History and Physical    Catherine Rich KXF:[id number] DOB: 10/09/1928 DOA: 10/12/2019  PCP: Caryl Bis, MD (Confirm with patient/family/NH records and if not entered, this has to be entered at [facility] point of entry) Patient coming from: home  I have personally briefly reviewed patient's old medical records in [place]  Chief Complaint: painful leg after fall  HPI: Catherine Rich is a 86 y.o. female with medical history significant of CKD 3, HTN, GERD, DM 2, HLD, COPD with chronic respiratory failure, hypothyroidism, macular degeneration OS. She had a mechanical fall at home with the immediate onset of pain and inabiity to bear weight. Her daughter was at home and called EMS.  She was brought by EMS to [facility] for evaluation.    ED Course: T 98.4, 204/57, HR 72, R 12. Covid negative. Lab remarkable for Cr 1.72 ( no baseline available), glucose 181. Hip film revealed non-displaced intertrochanteric fx right. EDP consulted on-call ortho. [facility] called to admit patient.   Review of Systems: As per HPI otherwise 10 point review of systems negative.    Past Medical History:  Diagnosis Date  . Anemia   . Arthritis   . Chronic kidney disease    only one funtioning kidney  . Complication of anesthesia   . Diabetes mellitus without complication ([place])   . GERD (gastroesophageal reflux disease)   . Hypertension   . Hypothyroidism   . Macular degeneration, bilateral   . PONV (postoperative nausea and vomiting)   . Stroke [facility]) 01/19/2011    Past Surgical History:  Procedure Laterality Date  . ABDOMINAL HYSTERECTOMY  1970  . BACK SURGERY  01/02/2018   L4-L5   . EYE SURGERY    . TOTAL KNEE ARTHROPLASTY Right 08/02/2018   Procedure: TOTAL KNEE ARTHROPLASTY;  Surgeon: Sydnee Cabal, MD;  Location: [facility];  Service: Orthopedics;  Laterality: Right;  with adductor canal  . VASCULAR SURGERY  11/2013   [facility]    Soc Hx -  Married x 17 years and seperated. Had one son, died in 08-15-2022 from [place] at age 56, two living daughters, one of whom lives with her. She has 3 grandchildren, 3 great-grands. She is I-ADLs.    reports that she has never smoked. She has never used smokeless tobacco. She reports that she does not drink alcohol and does not use drugs.  Allergies  Allergen Reactions  . Penicillins Anaphylaxis and Rash    Did it involve swelling of the face/tongue/throat, SOB, or low BP? Yes Did it involve sudden or severe rash/hives, skin peeling, or any reaction on the inside of your mouth or nose? Yes Did you need to seek medical attention at a hospital or doctor's office? Yes When did it last happen?32 YEARS AGO , IN HER 30S If all above answers are "NO", may proceed with cephalosporin use.   . Betadine [Povidone Iodine] Other (See Comments)    IRRITATION AROUND EYES WHEN GETTING EYE INJECTIONS   . Eggs Or Egg-Derived Products Diarrhea and Nausea And Vomiting  . Other     icecream-diarrhea  . Codeine Nausea And Vomiting  . Sulfa Antibiotics Nausea And Vomiting    History reviewed. No pertinent family history.    Prior to Admission medications   Medication Sig Start Date End Date Taking? Authorizing Provider  ALPRAZolam Duanne Moron) 0.5 MG tablet Take 1 mg by mouth at bedtime.   Yes [provider]  aspirin EC 81 MG tablet Take 81 mg by mouth  daily. Swallow whole.   Yes [provider]  atorvastatin (LIPITOR) 40 MG tablet Take 40 mg by mouth daily.   Yes [provider]  azithromycin (ZITHROMAX) 500 MG tablet Take 500 mg by mouth daily. 10/10/19  Yes [provider]  Cholecalciferol (VITAMIN D) 50 MCG (2000 UT) tablet Take 2,000 Units by mouth daily.   Yes [provider]  furosemide (LASIX) 40 MG tablet Take 40 mg by mouth daily. 08/29/19  Yes [provider]  gabapentin (NEURONTIN) 300 MG capsule Take 300-600 mg by mouth at bedtime. DEPENDING ON PAIN , PT MAY TAKE 300 MG OR 600 MG AT BEDTIME   Yes [provider]  irbesartan (AVAPRO) 300 MG tablet Take 300 mg by mouth daily. 08/30/19  Yes [provider]  levothyroxine (SYNTHROID) 112 MCG tablet Take 112 mcg by mouth daily before breakfast.   Yes [provider]  loratadine (CLARITIN) 10 MG tablet Take 10 mg by mouth daily.   Yes [provider]  Lutein-Zeaxanthin 25-5 MG CAPS Take 1 capsule by mouth daily.   Yes [provider]  Multiple Vitamins-Minerals (MULTIVITAMIN WITH MINERALS) tablet Take 1 tablet by mouth daily.   Yes [provider]  neomycin-polymyxin-dexameth (MAXITROL) 0.1 % OINT Place 1 application into both eyes 3 (three) times daily.   Yes [provider]  ofloxacin (OCUFLOX) 0.3 % ophthalmic solution Place 1 drop into both eyes See admin instructions. Pt to instill 1 drop in operative eye three times daily  2 days before and 3 days after eye injection   Yes [provider]  pantoprazole (PROTONIX) 20 MG tablet Take 20 mg by mouth daily.   Yes [provider]  triamcinolone cream (KENALOG) 0.1 % Apply 1 application topically 2 (two) times daily. 09/29/19  Yes [provider]  vitamin B-12 (CYANOCOBALAMIN) 1000 MCG tablet Take 1,000 mcg by mouth daily.   Yes [provider]  vitamin C (ASCORBIC ACID) 500 MG tablet Take 500 mg by mouth daily.   Yes [provider]    Physical Exam: Vitals:   10/12/19 1720 10/12/19 1730 10/12/19 1800 10/12/19 1830  BP:  (!) 196/186 (!) 204/61 (!) 204/57  Pulse: 80 81 77 72  Resp:   13 (!) 21  Temp:      TempSrc:      SpO2: 90% 92% 93% 95%  Weight:      Height:         Vitals:   10/12/19 1720 10/12/19 1730 10/12/19 1800 10/12/19 1830  BP:  (!) 196/186 (!) 204/61 (!) 204/57  Pulse: 80 81 77 72  Resp:   13 (!) 21  Temp:      TempSrc:      SpO2: 90% 92% 93% 95%  Weight:      Height:       General -  Overweight elderly woman in no acute distress.  Eyes: PERRL, lids and conjunctivae  normal ENMT: Mucous membranes are moist. Posterior pharynx clear of any exudate or lesions.Edentulous with full dentures.  Neck: normal, supple, no masses, no thyromegaly Respiratory: clear to auscultation bilaterally, no wheezing, no crackles. Normal respiratory effort. No accessory muscle use.  Cardiovascular: Regular rate and rhythm, no murmurs / rubs / gallops. No extremity edema. 2+ pedal pulses. No carotid bruits.  Abdomen: obese, no tenderness, no masses palpated. No hepatosplenomegaly. Bowel sounds positive.  Musculoskeletal: no clubbing / cyanosis. No joint deformity upper and lower extremities. Right leg is not rotated.  Normal muscle tone.  Skin: no rashes, lesions, ulcers. No induration Neurologic: CN 2-12 grossly intact. Strength 4/5 in all 4.  Psychiatric: Normal judgment and insight. Alert and oriented x 3. Normal mood.     Labs on Admission: I have personally reviewed following labs and imaging studies  CBC: Recent Labs  Lab 10/12/19 1712  WBC 5.6  NEUTROABS 2.9  HGB 8.2*  HCT 30.3*  MCV 70.1*  PLT 016   Basic Metabolic Panel: Recent Labs  Lab 10/12/19 1712  NA 135  K 4.9  CL 102  CO2 21*  GLUCOSE 181*  BUN 30*  CREATININE 1.72*  CALCIUM 8.8*   GFR: Estimated Creatinine Clearance: 21.8 mL/min (A) (by C-G formula based on SCr of 1.72 mg/dL (H)). Liver Function Tests: No results for input(s): AST, ALT, ALKPHOS, BILITOT, PROT, ALBUMIN in the last 168 hours. No results for input(s): LIPASE, AMYLASE in the last 168 hours. No results for input(s): AMMONIA in the last 168 hours. Coagulation Profile: Recent Labs  Lab 10/12/19 1712  INR 1.1   Cardiac Enzymes: No results for input(s): CKTOTAL, CKMB, CKMBINDEX, TROPONINI in the last 168 hours. BNP (last 3 results) No results for input(s): PROBNP in the last 8760 hours. HbA1C: No results for input(s): HGBA1C in the last 72 hours. CBG: No results for input(s): GLUCAP in the last 168 hours. Lipid  Profile: No results for input(s): CHOL, HDL, LDLCALC, TRIG, CHOLHDL, LDLDIRECT in the last 72 hours. Thyroid Function Tests: No results for input(s): TSH, T4TOTAL, FREET4, T3FREE, THYROIDAB in the last 72 hours. Anemia Panel: No results for input(s): VITAMINB12, FOLATE, FERRITIN, TIBC, IRON, RETICCTPCT in the last 72 hours. Urine analysis: No results found for: COLORURINE, APPEARANCEUR, LABSPEC, PHURINE, GLUCOSEU, HGBUR, BILIRUBINUR, KETONESUR, PROTEINUR, UROBILINOGEN, NITRITE, LEUKOCYTESUR  Radiological Exams on Admission: DG Chest Port 1 View  Result Date: 10/12/2019 CLINICAL DATA:  Pt states she fell back and landed on floor earlier today. Pt c/o right leg pain EXAM: PORTABLE CHEST 1 VIEW COMPARISON:  Chest radiograph 02/08/2019 FINDINGS: Stable cardiomediastinal contours with aortic stent graft in place. Enlarged heart size. There are scattered bibasilar opacities likely atelectasis or scarring. No pneumothorax or large pleural effusion. No acute finding in the visualized skeleton. IMPRESSION: Scattered bibasilar opacities likely reflecting atelectasis or scarring. Electronically Signed   By: Audie Pinto M.D.   On: 10/12/2019 17:38   DG Hip Unilat With Pelvis 2-3 Views Right  Result Date: 10/12/2019 CLINICAL DATA:  [place] [address] backwards landing on floor earlier today, RIGHT hip pain EXAM: DG HIP (WITH OR WITHOUT PELVIS) 2-3V RIGHT COMPARISON:  None FINDINGS: Osseous demineralization. SI joints poorly visualized. Mild narrowing of the hip joints bilaterally. Mildly displaced lesser trochanteric fracture. Lucency in the intertrochanteric region of the proximal RIGHT femur, suspicious for nondisplaced intertrochanteric fracture. A portion of the fracture plane is evident on the shoot through lateral view but the superolateral portion of the fracture line is not clearly delineated. No dislocation or pelvic fracture seen. IMPRESSION: Suspected nondisplaced intertrochanteric fracture of the RIGHT femur  with a mildly displaced lesser trochanteric fracture fragment. The superolateral extent of the fracture is not well demonstrated; this could be better delineated by CT. Electronically Signed   By: Lavonia Dana M.D.   On: 10/12/2019 17:42    EKG: Independently reviewed. NSR, possible old anteroseptal injury  Assessment/Plan Active Problems:   HTN (hypertension)   Chronic respiratory failure with hypoxia (HCC)   DM (diabetes mellitus) type II controlled with renal manifestation (HCC)   Chronic  diastolic CHF (congestive heart failure) (HCC)   Hypothyroidism   Intertrochanteric fx-closed, right, initial encounter ([place])     1. Ortho - intertrochanteric fx. Plan [facility] contacted and will see patient when transferred. Her surgeon is Dr.                   Theda Sers  Routine pain management per Hip fracture protocol  2. Chronic HFpEF - no evidence of decompensation. Plan Continue home medications  3. DM II - patient reports she is off all hyperglycemic medications. Glucose elevated Plan A1C  Sliding scale coverage  4. COPD - chronic respiratory insufficiency using O2 at night. Plan O2 at 2l [place]  Continue home medications  5. Hypothyroidism - continue home medications.   6. HTN - systolic BP elevated at presentation - suspect due to pain. Plan Continue home meds.   DVT prophylaxis: heparin SQ   Code Status: DNR - discussed with patient and she is clear on this subject  Family Communication: spoke with daughter who is present in ED   Disposition Plan: TBD  Consults called: ortho - Dr. On call for Dr. Theda Sers  Admission status: inpatient   Adella Hare MD [facility] Pager [phone]  If 7PM-7AM, please contact night-coverage [email or web address] Password [facility]  10/12/2019, 7:31 PM

## 2019-10-13 ENCOUNTER — Inpatient Hospital Stay (HOSPITAL_COMMUNITY): Payer: Medicare Other | Admitting: Anesthesiology

## 2019-10-13 ENCOUNTER — Inpatient Hospital Stay: Admit: 2019-10-13 | Payer: Medicare Other | Admitting: Orthopedic Surgery

## 2019-10-13 ENCOUNTER — Inpatient Hospital Stay (HOSPITAL_COMMUNITY): Payer: Medicare Other

## 2019-10-13 ENCOUNTER — Encounter (HOSPITAL_COMMUNITY)
Admission: EM | Disposition: A | Discharge status: Skilled Nursing Facility | Payer: Self-pay | Source: Home / Self Care | Attending: Internal Medicine

## 2019-10-13 ENCOUNTER — Encounter (HOSPITAL_COMMUNITY): Payer: Self-pay | Admitting: Internal Medicine

## 2019-10-13 DIAGNOSIS — I10 Essential (primary) hypertension: Secondary | ICD-10-CM

## 2019-10-13 HISTORY — PX: INTRAMEDULLARY (IM) NAIL INTERTROCHANTERIC: SHX5875

## 2019-10-13 LAB — COMPREHENSIVE METABOLIC PANEL
ALT: 14 U/L (ref 0–44)
AST: 19 U/L (ref 15–41)
Albumin: 3.1 g/dL — ABNORMAL LOW (ref 3.5–5.0)
Alkaline Phosphatase: 78 U/L (ref 38–126)
Anion gap: 8 (ref 5–15)
BUN: 30 mg/dL — ABNORMAL HIGH (ref 8–23)
CO2: 23 mmol/L (ref 22–32)
Calcium: 8.3 mg/dL — ABNORMAL LOW (ref 8.9–10.3)
Chloride: 104 mmol/L (ref 98–111)
Creatinine, Ser: 1.83 mg/dL — ABNORMAL HIGH (ref 0.44–1.00)
GFR calc Af Amer: 28 mL/min — ABNORMAL LOW (ref >60)
GFR calc non Af Amer: 24 mL/min — ABNORMAL LOW (ref >60)
Glucose, Bld: 175 mg/dL — ABNORMAL HIGH (ref 70–99)
Potassium: 4.9 mmol/L (ref 3.5–5.1)
Sodium: 135 mmol/L (ref 135–145)
Total Bilirubin: 1.1 mg/dL (ref 0.3–1.2)
Total Protein: 6.6 g/dL (ref 6.5–8.1)

## 2019-10-13 LAB — CBC WITH DIFFERENTIAL/PLATELET
Abs Immature Granulocytes: 0.03 10*3/uL (ref 0.00–0.07)
Basophils Absolute: 0.1 10*3/uL (ref 0.0–0.1)
Basophils Relative: 1 %
Eosinophils Absolute: 0.2 10*3/uL (ref 0.0–0.5)
Eosinophils Relative: 4 %
HCT: 30.5 % — ABNORMAL LOW (ref 36.0–46.0)
Hemoglobin: 8.1 g/dL — ABNORMAL LOW (ref 12.0–15.0)
Immature Granulocytes: 1 %
Lymphocytes Relative: 25 %
Lymphs Abs: 1.4 10*3/uL (ref 0.7–4.0)
MCH: 19 pg — ABNORMAL LOW (ref 26.0–34.0)
MCHC: 26.6 g/dL — ABNORMAL LOW (ref 30.0–36.0)
MCV: 71.6 fL — ABNORMAL LOW (ref 80.0–100.0)
Monocytes Absolute: 0.5 10*3/uL (ref 0.1–1.0)
Monocytes Relative: 10 %
Neutro Abs: 3.4 10*3/uL (ref 1.7–7.7)
Neutrophils Relative %: 59 %
Platelets: 128 10*3/uL — ABNORMAL LOW (ref 150–400)
RBC: 4.26 MIL/uL (ref 3.87–5.11)
RDW: 20.8 % — ABNORMAL HIGH (ref 11.5–15.5)
WBC: 5.6 10*3/uL (ref 4.0–10.5)
nRBC: 0 % (ref 0.0–0.2)

## 2019-10-13 LAB — BASIC METABOLIC PANEL
Anion gap: 8 (ref 5–15)
BUN: 30 mg/dL — ABNORMAL HIGH (ref 8–23)
CO2: 24 mmol/L (ref 22–32)
Calcium: 8.1 mg/dL — ABNORMAL LOW (ref 8.9–10.3)
Chloride: 103 mmol/L (ref 98–111)
Creatinine, Ser: 1.86 mg/dL — ABNORMAL HIGH (ref 0.44–1.00)
GFR calc Af Amer: 27 mL/min — ABNORMAL LOW (ref >60)
GFR calc non Af Amer: 24 mL/min — ABNORMAL LOW (ref >60)
Glucose, Bld: 173 mg/dL — ABNORMAL HIGH (ref 70–99)
Potassium: 4.7 mmol/L (ref 3.5–5.1)
Sodium: 135 mmol/L (ref 135–145)

## 2019-10-13 LAB — MAGNESIUM: Magnesium: 1.7 mg/dL (ref 1.7–2.4)

## 2019-10-13 LAB — HEMOGLOBIN A1C
Hgb A1c MFr Bld: 11.2 % — ABNORMAL HIGH (ref 4.8–5.6)
Mean Plasma Glucose: 274.74 mg/dL

## 2019-10-13 LAB — PREPARE RBC (CROSSMATCH)

## 2019-10-13 LAB — GLUCOSE, CAPILLARY
Glucose-Capillary: 145 mg/dL — ABNORMAL HIGH (ref 70–99)
Glucose-Capillary: 235 mg/dL — ABNORMAL HIGH (ref 70–99)

## 2019-10-13 LAB — CBG MONITORING, ED
Glucose-Capillary: 197 mg/dL — ABNORMAL HIGH (ref 70–99)
Glucose-Capillary: 159 mg/dL — ABNORMAL HIGH (ref 70–99)
Glucose-Capillary: 127 mg/dL — ABNORMAL HIGH (ref 70–99)

## 2019-10-13 SURGERY — FIXATION, FRACTURE, INTERTROCHANTERIC, WITH INTRAMEDULLARY ROD
Anesthesia: Spinal | Site: Hip | Laterality: Right

## 2019-10-13 MED ORDER — MIDAZOLAM HCL 5 MG/5ML IJ SOLN
INTRAMUSCULAR | Status: DC | PRN
  Administered 2019-10-13 (×2): 1 mg via INTRAVENOUS

## 2019-10-13 MED ORDER — PHENYLEPHRINE HCL-NACL 10-0.9 MG/250ML-% IV SOLN
INTRAVENOUS | Status: DC | PRN
  Administered 2019-10-13: 50 ug/min via INTRAVENOUS

## 2019-10-13 MED ORDER — ENSURE PRE-SURGERY PO LIQD: 296.0000 mL | Freq: Once | ORAL | Status: DC

## 2019-10-13 MED ORDER — CHLORHEXIDINE GLUCONATE 0.12 % MT SOLN
OROMUCOSAL | Status: AC
  Administered 2019-10-13: 15 mL via OROMUCOSAL
  Filled 2019-10-13: qty 15

## 2019-10-13 MED ORDER — ONDANSETRON HCL 4 MG PO TABS: 4.0000 mg | ORAL_TABLET | Freq: Four times a day (QID) | ORAL | Status: DC | PRN

## 2019-10-13 MED ORDER — METOCLOPRAMIDE HCL 5 MG PO TABS: 5.0000 mg | ORAL_TABLET | Freq: Three times a day (TID) | ORAL | Status: DC | PRN

## 2019-10-13 MED ORDER — PHENOL 1.4 % MT LIQD: 1.0000 | OROMUCOSAL | Status: DC | PRN

## 2019-10-13 MED ORDER — VANCOMYCIN HCL IN DEXTROSE 1-5 GM/200ML-% IV SOLN
1000.0000 mg | INTRAVENOUS | Status: AC
  Administered 2019-10-13: 1000 mg via INTRAVENOUS
  Filled 2019-10-13: qty 200

## 2019-10-13 MED ORDER — EPHEDRINE SULFATE-NACL 50-0.9 MG/10ML-% IV SOSY
PREFILLED_SYRINGE | INTRAVENOUS | Status: DC | PRN
  Administered 2019-10-13: 15 mg via INTRAVENOUS

## 2019-10-13 MED ORDER — MENTHOL 3 MG MT LOZG: 1.0000 | LOZENGE | OROMUCOSAL | Status: DC | PRN

## 2019-10-13 MED ORDER — 0.9 % SODIUM CHLORIDE (POUR BTL) OPTIME
TOPICAL | Status: DC | PRN
  Administered 2019-10-13: 1000 mL

## 2019-10-13 MED ORDER — POVIDONE-IODINE 10 % EX SWAB: 2.0000 "application " | Freq: Once | CUTANEOUS | Status: DC

## 2019-10-13 MED ORDER — VANCOMYCIN HCL IN DEXTROSE 1-5 GM/200ML-% IV SOLN: 1000.0000 mg | Freq: Two times a day (BID) | INTRAVENOUS | Status: DC

## 2019-10-13 MED ORDER — HYDROMORPHONE HCL 1 MG/ML IJ SOLN
0.2500 mg | INTRAMUSCULAR | Status: DC | PRN
  Administered 2019-10-13 (×2): 0.25 mg via INTRAVENOUS

## 2019-10-13 MED ORDER — LIDOCAINE 2% (20 MG/ML) 5 ML SYRINGE
INTRAMUSCULAR | Status: AC
  Filled 2019-10-13: qty 5

## 2019-10-13 MED ORDER — BUPIVACAINE IN DEXTROSE 0.75-8.25 % IT SOLN
INTRATHECAL | Status: DC | PRN
  Administered 2019-10-13: 1.6 mL via INTRATHECAL

## 2019-10-13 MED ORDER — KETAMINE HCL 10 MG/ML IJ SOLN
INTRAMUSCULAR | Status: DC | PRN
  Administered 2019-10-13: 30 mg via INTRAVENOUS

## 2019-10-13 MED ORDER — ONDANSETRON HCL 4 MG/2ML IJ SOLN
INTRAMUSCULAR | Status: AC
  Filled 2019-10-13: qty 2

## 2019-10-13 MED ORDER — ROCURONIUM BROMIDE 10 MG/ML (PF) SYRINGE
PREFILLED_SYRINGE | INTRAVENOUS | Status: AC
  Filled 2019-10-13: qty 10

## 2019-10-13 MED ORDER — TRANEXAMIC ACID-NACL 1000-0.7 MG/100ML-% IV SOLN
1000.0000 mg | INTRAVENOUS | Status: AC
  Administered 2019-10-13: 1000 mg via INTRAVENOUS
  Filled 2019-10-13: qty 100

## 2019-10-13 MED ORDER — METOCLOPRAMIDE HCL 5 MG/ML IJ SOLN: 5.0000 mg | Freq: Three times a day (TID) | INTRAMUSCULAR | Status: DC | PRN

## 2019-10-13 MED ORDER — SODIUM CHLORIDE 0.45 % IV SOLN: INTRAVENOUS | Status: DC

## 2019-10-13 MED ORDER — VANCOMYCIN HCL 500 MG/100ML IV SOLN
500.0000 mg | Freq: Once | INTRAVENOUS | Status: AC
  Administered 2019-10-14: 500 mg via INTRAVENOUS
  Filled 2019-10-13: qty 100

## 2019-10-13 MED ORDER — PROPOFOL 500 MG/50ML IV EMUL
INTRAVENOUS | Status: DC | PRN
  Administered 2019-10-13: 50 ug/kg/min via INTRAVENOUS

## 2019-10-13 MED ORDER — SODIUM CHLORIDE 0.9 % IV SOLN: INTRAVENOUS | Status: DC | PRN

## 2019-10-13 MED ORDER — MIDAZOLAM HCL 2 MG/2ML IJ SOLN
INTRAMUSCULAR | Status: AC
  Filled 2019-10-13: qty 2

## 2019-10-13 MED ORDER — CHLORHEXIDINE GLUCONATE 0.12 % MT SOLN: 15.0000 mL | Freq: Once | OROMUCOSAL | Status: AC

## 2019-10-13 MED ORDER — ONDANSETRON HCL 4 MG/2ML IJ SOLN: 4.0000 mg | Freq: Four times a day (QID) | INTRAMUSCULAR | Status: DC | PRN

## 2019-10-13 MED ORDER — PROPOFOL 10 MG/ML IV BOLUS
INTRAVENOUS | Status: DC | PRN
  Administered 2019-10-13: 20 mg via INTRAVENOUS

## 2019-10-13 MED ORDER — ASPIRIN EC 325 MG PO TBEC
325.0000 mg | DELAYED_RELEASE_TABLET | Freq: Every day | ORAL | Status: DC
  Administered 2019-10-14 – 2019-10-17 (×4): 325 mg via ORAL
  Filled 2019-10-13 (×4): qty 1

## 2019-10-13 MED ORDER — PANTOPRAZOLE SODIUM 40 MG PO TBEC
40.0000 mg | DELAYED_RELEASE_TABLET | Freq: Every day | ORAL | Status: DC
  Administered 2019-10-13 – 2019-10-17 (×5): 40 mg via ORAL
  Filled 2019-10-13 (×5): qty 1

## 2019-10-13 MED ORDER — HYDROMORPHONE HCL 1 MG/ML IJ SOLN
INTRAMUSCULAR | Status: AC
Start: 2019-10-13 — End: 2019-10-14
  Filled 2019-10-13: qty 1

## 2019-10-13 MED ORDER — SODIUM CHLORIDE 0.45 % IV SOLN: INTRAVENOUS | Status: AC

## 2019-10-13 MED ORDER — DEXAMETHASONE SODIUM PHOSPHATE 10 MG/ML IJ SOLN
INTRAMUSCULAR | Status: AC
  Filled 2019-10-13: qty 1

## 2019-10-13 MED ORDER — NEOMYCIN-POLYMYXIN-DEXAMETH 3.5-10000-0.1 OP SUSP
2.0000 [drp] | Freq: Four times a day (QID) | OPHTHALMIC | Status: DC
  Administered 2019-10-13 – 2019-10-17 (×15): 2 [drp] via OPHTHALMIC
  Filled 2019-10-13 (×2): qty 5

## 2019-10-13 MED ORDER — CHLORHEXIDINE GLUCONATE 4 % EX LIQD: 60.0000 mL | Freq: Once | CUTANEOUS | Status: DC

## 2019-10-13 MED ORDER — PROPOFOL 1000 MG/100ML IV EMUL
INTRAVENOUS | Status: AC
  Filled 2019-10-13: qty 100

## 2019-10-13 MED ORDER — DOCUSATE SODIUM 100 MG PO CAPS
100.0000 mg | ORAL_CAPSULE | Freq: Two times a day (BID) | ORAL | Status: DC
  Administered 2019-10-13 – 2019-10-17 (×8): 100 mg via ORAL
  Filled 2019-10-13 (×8): qty 1

## 2019-10-13 MED ORDER — EPHEDRINE 5 MG/ML INJ
INTRAVENOUS | Status: AC
  Filled 2019-10-13: qty 10

## 2019-10-13 MED ORDER — FENTANYL CITRATE (PF) 250 MCG/5ML IJ SOLN
INTRAMUSCULAR | Status: AC
  Filled 2019-10-13: qty 5

## 2019-10-13 MED ORDER — LACTATED RINGERS IV SOLN: INTRAVENOUS | Status: DC

## 2019-10-13 SURGICAL SUPPLY — 50 items
ALCOHOL 70% 16 OZ (MISCELLANEOUS) ×3 IMPLANT
BIT DRILL CANN LG 4.3MM (BIT) ×1 IMPLANT
BNDG COHESIVE 4X5 TAN STRL (GAUZE/BANDAGES/DRESSINGS) IMPLANT
CHLORAPREP W/TINT 26 (MISCELLANEOUS) ×3 IMPLANT
COVER PERINEAL POST (MISCELLANEOUS) ×3 IMPLANT
COVER SURGICAL LIGHT HANDLE (MISCELLANEOUS) ×3 IMPLANT
COVER WAND RF STERILE (DRAPES) ×3 IMPLANT
DERMABOND ADVANCED (GAUZE/BANDAGES/DRESSINGS) ×2
DERMABOND ADVANCED .7 DNX12 (GAUZE/BANDAGES/DRESSINGS) ×1 IMPLANT
DRAPE C-ARM 42X72 X-RAY (DRAPES) ×3 IMPLANT
DRAPE C-ARMOR (DRAPES) ×3 IMPLANT
DRAPE HALF SHEET 40X57 (DRAPES) ×3 IMPLANT
DRAPE IMP U-DRAPE 54X76 (DRAPES) ×6 IMPLANT
DRAPE STERI IOBAN 125X83 (DRAPES) ×3 IMPLANT
DRAPE U-SHAPE 47X51 STRL (DRAPES) ×6 IMPLANT
DRILL BIT CANN LG 4.3MM (BIT) ×3
DRSG MEPILEX BORDER 4X4 (GAUZE/BANDAGES/DRESSINGS) ×6 IMPLANT
DRSG MEPILEX BORDER 4X8 (GAUZE/BANDAGES/DRESSINGS) ×6 IMPLANT
DRSG PAD ABDOMINAL 8X10 ST (GAUZE/BANDAGES/DRESSINGS) IMPLANT
ELECT REM PT RETURN 9FT ADLT (ELECTROSURGICAL) ×3
ELECTRODE REM PT RTRN 9FT ADLT (ELECTROSURGICAL) ×1 IMPLANT
FACESHIELD WRAPAROUND (MASK) ×3 IMPLANT
GLOVE BIO SURGEON STRL SZ8.5 (GLOVE) ×6 IMPLANT
GLOVE BIOGEL PI IND STRL 8.5 (GLOVE) ×1 IMPLANT
GLOVE BIOGEL PI INDICATOR 8.5 (GLOVE) ×2
GOWN STRL REUS W/ TWL LRG LVL3 (GOWN DISPOSABLE) ×2 IMPLANT
GOWN STRL REUS W/TWL 2XL LVL3 (GOWN DISPOSABLE) ×3 IMPLANT
GOWN STRL REUS W/TWL LRG LVL3 (GOWN DISPOSABLE) ×4
GUIDEPIN 3.2X17.5 THRD DISP (PIN) ×3 IMPLANT
HFN 125 DEG 9MM X 180MM (Nail) ×3 IMPLANT
HIP FRAC NAIL LAG SCR 10.5X100 (Orthopedic Implant) ×2 IMPLANT
KIT TURNOVER KIT B (KITS) ×3 IMPLANT
MANIFOLD NEPTUNE II (INSTRUMENTS) ×3 IMPLANT
MARKER SKIN DUAL TIP RULER LAB (MISCELLANEOUS) ×3 IMPLANT
NS IRRIG 1000ML POUR BTL (IV SOLUTION) ×3 IMPLANT
PACK GENERAL/GYN (CUSTOM PROCEDURE TRAY) ×3 IMPLANT
PACK UNIVERSAL I (CUSTOM PROCEDURE TRAY) ×3 IMPLANT
PAD ARMBOARD 7.5X6 YLW CONV (MISCELLANEOUS) ×6 IMPLANT
PADDING CAST ABS 4INX4YD NS (CAST SUPPLIES)
PADDING CAST ABS COTTON 4X4 ST (CAST SUPPLIES) IMPLANT
SCREW BONE CORTICAL 5.0X3 (Screw) ×3 IMPLANT
SCREW CANN THRD AFF 10.5X100 (Orthopedic Implant) ×1 IMPLANT
SUT MNCRL AB 3-0 PS2 27 (SUTURE) IMPLANT
SUT MON AB 2-0 CT1 27 (SUTURE) ×9 IMPLANT
SUT VIC AB 1 CT1 27 (SUTURE) ×2
SUT VIC AB 1 CT1 27XBRD ANBCTR (SUTURE) ×1 IMPLANT
SUT VICRYL 0 UR6 27IN ABS (SUTURE) ×6 IMPLANT
SYR BULB IRRIG 60ML STRL (SYRINGE) ×3 IMPLANT
TOWEL GREEN STERILE (TOWEL DISPOSABLE) ×3 IMPLANT
TOWEL GREEN STERILE FF (TOWEL DISPOSABLE) ×3 IMPLANT

## 2019-10-13 NOTE — Consult Note (Signed)
ORTHOPAEDIC CONSULTATION  REQUESTING PHYSICIAN: Aline August, MD  PCP:  Caryl Bis, MD  Chief Complaint: Right hip injury  HPI: Catherine Rich is a 86 y.o. female medical history of CKD 3, hypertension, diabetes type 2, COPD with chronic respiratory failure, who had a mechanical fall at home.  She had immediate right hip pain and inability to weight-bear.  She was brought to the emergency department at [facility], where x-rays revealed an intertrochanteric right femur fracture.  She was admitted by the hospitalist.  Orthopedic consultation was placed for management of the hip fracture.  She was transferred to [facility] for further management.  She denies other injuries.  Past Medical History:  Diagnosis Date  . Anemia   . Arthritis   . Chronic kidney disease    only one funtioning kidney  . Complication of anesthesia   . Diabetes mellitus without complication (Major)   . GERD (gastroesophageal reflux disease)   . Hypertension   . Hypothyroidism   . Macular degeneration, bilateral   . PONV (postoperative nausea and vomiting)   . Stroke [facility]) 01/19/2011   Past Surgical History:  Procedure Laterality Date  . ABDOMINAL HYSTERECTOMY  1970  . BACK SURGERY  01/02/2018   L4-L5   . EYE SURGERY    . TOTAL KNEE ARTHROPLASTY Right 08/02/2018   Procedure: TOTAL KNEE ARTHROPLASTY;  Surgeon: Sydnee Cabal, MD;  Location: [facility] ORS;  Service: Orthopedics;  Laterality: Right;  with adductor canal  . VASCULAR SURGERY  11/2013   AAA   Social History   Socioeconomic History  . Marital status: Widowed    Spouse name: Not on file  . Number of children: Not on file  . Years of education: Not on file  . Highest education level: Not on file  Occupational History  . Occupation: unemployed  Tobacco Use  . Smoking status: Never Smoker  . Smokeless tobacco: Never Used  Substance and Sexual Activity  . Alcohol use: Never  . Drug use: Never  . Sexual activity: Not  Currently  Other Topics Concern  . Not on file  Social History Narrative  . Not on file   Social Determinants of Health   Financial Resource Strain:   . Difficulty of Paying Living Expenses: Not on file  Food Insecurity:   . Worried About Charity fundraiser in the Last Year: Not on file  . Ran Out of Food in the Last Year: Not on file  Transportation Needs:   . Lack of Transportation (Medical): Not on file  . Lack of Transportation (Non-Medical): Not on file  Physical Activity:   . Days of Exercise per Week: Not on file  . Minutes of Exercise per Session: Not on file  Stress:   . Feeling of Stress : Not on file  Social Connections:   . Frequency of Communication with Friends and Family: Not on file  . Frequency of Social Gatherings with Friends and Family: Not on file  . Attends Religious Services: Not on file  . Active Member of Clubs or Organizations: Not on file  . Attends Archivist Meetings: Not on file  . Marital Status: Not on file   History reviewed. No pertinent family history. Allergies  Allergen Reactions  . Penicillins Anaphylaxis and Rash    Did it involve swelling of the face/tongue/throat, SOB, or low BP? Yes Did it involve sudden or severe rash/hives, skin peeling, or any reaction on the inside of your  mouth or nose? Yes Did you need to seek medical attention at a hospital or doctor's office? Yes When did it last happen?108 YEARS AGO , IN HER 30S If all above answers are "NO", may proceed with cephalosporin use.   . Betadine [Povidone Iodine] Other (See Comments)    IRRITATION AROUND EYES WHEN GETTING EYE INJECTIONS   . Eggs Or Egg-Derived Products Diarrhea and Nausea And Vomiting  . Other     icecream-diarrhea  . Codeine Nausea And Vomiting  . Sulfa Antibiotics Nausea And Vomiting   Prior to Admission medications   Medication Sig Start Date End Date Taking? Authorizing Provider  ALPRAZolam Duanne Moron) 0.5 MG tablet Take 1 mg by mouth at  bedtime.   Yes [provider]  aspirin EC 81 MG tablet Take 81 mg by mouth daily. Swallow whole.   Yes [provider]  atorvastatin (LIPITOR) 40 MG tablet Take 40 mg by mouth daily.   Yes [provider]  azithromycin (ZITHROMAX) 500 MG tablet Take 500 mg by mouth daily. 10/10/19  Yes [provider]  Cholecalciferol (VITAMIN D) 50 MCG (2000 UT) tablet Take 2,000 Units by mouth daily.   Yes [provider]  furosemide (LASIX) 40 MG tablet Take 40 mg by mouth daily. 08/29/19  Yes [provider]  gabapentin (NEURONTIN) 300 MG capsule Take 300-600 mg by mouth at bedtime. DEPENDING ON PAIN , PT MAY TAKE 300 MG OR 600 MG AT BEDTIME   Yes [provider]  irbesartan (AVAPRO) 300 MG tablet Take 300 mg by mouth daily. 08/30/19  Yes [provider]  levothyroxine (SYNTHROID) 112 MCG tablet Take 112 mcg by mouth daily before breakfast.   Yes [provider]  loratadine (CLARITIN) 10 MG tablet Take 10 mg by mouth daily.   Yes [provider]  Lutein-Zeaxanthin 25-5 MG CAPS Take 1 capsule by mouth daily.   Yes [provider]  Multiple Vitamins-Minerals (MULTIVITAMIN WITH MINERALS) tablet Take 1 tablet by mouth daily.   Yes [provider]  neomycin-polymyxin-dexameth (MAXITROL) 0.1 % OINT Place 1 application into both eyes 3 (three) times daily.   Yes [provider]  ofloxacin (OCUFLOX) 0.3 % ophthalmic solution Place 1 drop into both eyes See admin instructions. Pt to instill 1 drop in operative eye three times daily  2 days before and 3 days after eye injection   Yes [provider]  pantoprazole (PROTONIX) 20 MG tablet Take 20 mg by mouth daily.   Yes [provider]  triamcinolone cream (KENALOG) 0.1 % Apply 1 application topically 2 (two) times daily. 09/29/19  Yes [provider]  vitamin B-12 (CYANOCOBALAMIN) 1000 MCG tablet Take 1,000 mcg by mouth daily.   Yes  [provider]  vitamin C (ASCORBIC ACID) 500 MG tablet Take 500 mg by mouth daily.   Yes [provider]   DG Chest Port 1 View  Result Date: 10/12/2019 CLINICAL DATA:  Pt states she fell back and landed on floor earlier today. Pt c/o right leg pain EXAM: PORTABLE CHEST 1 VIEW COMPARISON:  Chest radiograph 02/08/2019 FINDINGS: Stable cardiomediastinal contours with aortic stent graft in place. Enlarged heart size. There are scattered bibasilar opacities likely atelectasis or scarring. No pneumothorax or large pleural effusion. No acute finding in the visualized skeleton. IMPRESSION: Scattered bibasilar opacities likely reflecting atelectasis or scarring. Electronically Signed   By: Audie Pinto M.D.   On: 10/12/2019 17:38   DG Hip Unilat With Pelvis 2-3 Views Right  Result Date: 10/12/2019 CLINICAL DATA:  [address] backwards landing on floor earlier today, RIGHT hip pain EXAM: DG HIP (WITH OR WITHOUT PELVIS) 2-3V RIGHT COMPARISON:  None FINDINGS: Osseous demineralization. SI joints poorly visualized. Mild narrowing of the hip joints bilaterally. Mildly displaced lesser trochanteric fracture. Lucency in the intertrochanteric region of the proximal RIGHT femur, suspicious for nondisplaced intertrochanteric fracture. A portion of the fracture plane is evident on the shoot through lateral view but the superolateral portion of the fracture line is not clearly delineated. No dislocation or pelvic fracture seen. IMPRESSION: Suspected nondisplaced intertrochanteric fracture of the RIGHT femur with a mildly displaced lesser trochanteric fracture fragment. The superolateral extent of the fracture is not well demonstrated; this could be better delineated by CT. Electronically Signed   By: Lavonia Dana M.D.   On: 10/12/2019 17:42   DG Femur Min 2 Views Right  Result Date: 10/12/2019 CLINICAL DATA:  Pt states she fell back and landed on floor earlier today. Pt c/o right leg pain EXAM: RIGHT FEMUR  2 VIEWS COMPARISON:  Right hip radiographs 10/12/2019 FINDINGS: There is again a mildly displaced lesser trochanteric fracture, similar in appearance to prior. Subtle lucency in the inter trochanteric region suspicious for nondisplaced fracture. No evidence of dislocation. No new acute finding in the right femur. Status post right knee arthroplasty. Alignment appears intact. There are vascular calcifications in the regional soft tissues. IMPRESSION: No significant changes in the right hip from earlier prior radiographs demonstrating a mildly displaced lesser trochanteric fracture and lucency in the inter trochanteric region suspicious for nondisplaced fracture. Electronically Signed   By: Audie Pinto M.D.   On: 10/12/2019 20:13    Positive ROS: All other systems have been reviewed and were otherwise negative with the exception of those mentioned in the HPI and as above.  Physical Exam: General: Alert, no acute distress Cardiovascular: No pedal edema Respiratory: No cyanosis, no use of accessory musculature GI: No organomegaly, abdomen is soft and non-tender Skin: No lesions in the area of chief complaint Neurologic: Sensation intact distally Psychiatric: Patient is competent for consent with normal mood and affect Lymphatic: No axillary or cervical lymphadenopathy  MUSCULOSKELETAL: Examination the right hip reveals no skin wounds or lesions.  She is holding the extremity in external rotation.  She has pain with attempted logrolling of the hip.  She is neurovascularly intact.  Assessment: Unstable right intertrochanteric femur fracture CKD 3 Diabetes  Plan: I discussed the findings with the patient.  I recommend surgical stabilization of her hip fracture in order to control pain and allow for rapid mobilization.  We discussed the risk, benefits, and alternatives to intramedullary fixation of the right femur.  Please see statement of risk.  Plan for surgery today.  All questions solicited  and answered.  The risks, benefits, and alternatives were discussed with the patient. There are risks associated with the surgery including, but not limited to, problems with anesthesia (death), infection, differences in leg length/angulation/rotation, fracture of bones, loosening or failure of implants, malunion, nonunion, hematoma (blood accumulation) which may require surgical drainage, blood clots, pulmonary embolism, nerve injury (foot drop), and blood vessel injury. The patient understands these risks and elects to proceed.   Bertram Savin, MD [phone]    10/13/2019 1:49 PM

## 2019-10-13 NOTE — Progress Notes (Signed)
Patient ID: Catherine Rich, female   DOB: 06-15-1928, 86 y.o.   MRN: [id number]  PROGRESS NOTE    Catherine Rich  UKG:[id number] DOB: [birth year] DOA: 10/12/2019 PCP: Catherine Bis, MD   Brief Narrative:  86 year old female with history of chronic kidney disease stage III, hypertension, GERD, diabetes mellitus type 2, hyperlipidemia, COPD, chronic hypoxic respiratory failure, hypothyroidism, macular degeneration presented with mechanical fall at home with subsequent pain and inability to bear weight.  She was found to have nondisplaced right intertrochanteric fracture.  Orthopedics recommended transfer to [facility] for surgical intervention.  Assessment & Plan:   Nondisplaced right intertrochanteric fracture -Awaiting transfer to [facility] for surgical intervention orthopedics. -NPO.  Pain management.  Fall precautions.  Chronic diastolic heart failure -Currently compensated.  Continue irbesartan, Lasix.  Outpatient follow-up with cardiology.  Strict input output.  Daily weights.  Fluid restriction  Diabetes mellitus type 2 with hyperglycemia -Continue CBGs with SSI.  COPD Chronic hypoxic respiratory failure -Patient uses oxygen at night.  Continue oxygen supplementation as needed.  Currently stable.  Use nebs as needed  Hypothyroidism -continue levothyroxine  Hypertension -Monitor blood pressure.  Continue irbesartan and Lasix  Acute on chronic blood loss anemia -Probably from fracture.  Monitor H&H.  Transfuse if hemoglobin is less than 7.  DC aspirin  DVT prophylaxis: Holding Lovenox for possible surgical intervention Code Status: DNR Family Communication: None at bedside Disposition Plan: Status is: Inpatient  Remains inpatient appropriate because:Inpatient level of care appropriate due to severity of illness   Dispo: The patient is from: Home              Anticipated d/c is to: SNF.  Might need SNF placement after surgical intervention               Anticipated d/c date is: 3 days              Patient currently is not medically stable to d/c.   Consultants: Orthopedics has been consulted  Procedures: None  Antimicrobials: None   Subjective: Patient seen and examined at bedside.  Complains of pain with any movement.  Denies any current nausea, vomiting, worsening shortness of breath  Objective: Vitals:   10/13/19 0515 10/13/19 0530 10/13/19 0600 10/13/19 0630  BP: 116/68 (!) 143/45 (!) 141/42 (!) 138/41  Pulse:   67 60  Resp: 15 18 (!) 24 14  Temp:   98.9 F (37.2 C)   TempSrc:      SpO2:   96% (!) 86%  Weight:      Height:       No intake or output data in the 24 hours ending 10/13/19 0757 Filed Weights   10/12/19 1718  Weight: 77.1 kg    Examination:  General exam: Appears calm and comfortable.  Elderly female lying in bed. Respiratory system: Bilateral decreased breath sounds at bases Cardiovascular system: S1 & S2 heard, Rate controlled Gastrointestinal system: Abdomen is nondistended, soft and nontender. Normal bowel sounds heard. Extremities: No cyanosis, clubbing, edema  Central nervous system: Alert and oriented. No focal neurological deficits. Moving extremities Skin: No rashes, lesions or ulcers Psychiatry: Flat affect.  Looks slightly anxious intermittently    Data Reviewed: I have personally reviewed following labs and imaging studies  CBC: Recent Labs  Lab 10/12/19 1712  WBC 5.6  NEUTROABS 2.9  HGB 8.2*  HCT 30.3*  MCV 70.1*  PLT 762   Basic Metabolic Panel: Recent Labs  Lab 10/12/19 1712  NA 135  K 4.9  CL 102  CO2 21*  GLUCOSE 181*  BUN 30*  CREATININE 1.72*  CALCIUM 8.8*   GFR: Estimated Creatinine Clearance: 21.8 mL/min (A) (by C-G formula based on SCr of 1.72 mg/dL (H)). Liver Function Tests: No results for input(s): AST, ALT, ALKPHOS, BILITOT, PROT, ALBUMIN in the last 168 hours. No results for input(s): LIPASE, AMYLASE in the last 168 hours. No results for  input(s): AMMONIA in the last 168 hours. Coagulation Profile: Recent Labs  Lab 10/12/19 1712  INR 1.1   Cardiac Enzymes: No results for input(s): CKTOTAL, CKMB, CKMBINDEX, TROPONINI in the last 168 hours. BNP (last 3 results) No results for input(s): PROBNP in the last 8760 hours. HbA1C: No results for input(s): HGBA1C in the last 72 hours. CBG: Recent Labs  Lab 10/13/19 0221  GLUCAP 197*   Lipid Profile: No results for input(s): CHOL, HDL, LDLCALC, TRIG, CHOLHDL, LDLDIRECT in the last 72 hours. Thyroid Function Tests: No results for input(s): TSH, T4TOTAL, FREET4, T3FREE, THYROIDAB in the last 72 hours. Anemia Panel: No results for input(s): VITAMINB12, FOLATE, FERRITIN, TIBC, IRON, RETICCTPCT in the last 72 hours. Sepsis Labs: No results for input(s): PROCALCITON, LATICACIDVEN in the last 168 hours.  Recent Results (from the past 240 hour(s))  SARS Coronavirus 2 by RT PCR (hospital order, performed in [facility] hospital lab) Nasopharyngeal Nasopharyngeal Swab     Status: None   Collection Time: 10/12/19  6:57 PM   Specimen: Nasopharyngeal Swab  Result Value Ref Range Status   SARS Coronavirus 2 NEGATIVE NEGATIVE Final    Comment: (NOTE) SARS-CoV-2 target nucleic acids are NOT DETECTED.  The SARS-CoV-2 RNA is generally detectable in upper and lower respiratory specimens during the acute phase of infection. The lowest concentration of SARS-CoV-2 viral copies this assay can detect is 250 copies / mL. A negative result does not preclude SARS-CoV-2 infection and should not be used as the sole basis for treatment or other patient management decisions.  A negative result may occur with improper specimen collection / handling, submission of specimen other than nasopharyngeal swab, presence of viral mutation(s) within the areas targeted by this assay, and inadequate number of viral copies (<250 copies / mL). A negative result must be combined with clinical observations,  patient history, and epidemiological information.  Fact Sheet for Patients:   StrictlyIdeas.no  Fact Sheet for Healthcare Providers: BankingDealers.co.za  This test is not yet approved or  cleared by the [place] FDA and has been authorized for detection and/or diagnosis of SARS-CoV-2 by FDA under an Emergency Use Authorization (EUA).  This EUA will remain in effect (meaning this test can be used) for the duration of the COVID-19 declaration under Section 564(b)(1) of the Act, 21 U.S.C. section 360bbb-3(b)(1), unless the authorization is terminated or revoked sooner.  Performed at [facility], [address], [place], [place] [address]          Radiology Studies: [facility] 1 View  Result Date: 10/12/2019 CLINICAL DATA:  Pt states she fell back and landed on floor earlier today. Pt c/o right leg pain EXAM: PORTABLE CHEST 1 VIEW COMPARISON:  Chest radiograph 02/08/2019 FINDINGS: Stable cardiomediastinal contours with aortic stent graft in place. Enlarged heart size. There are scattered bibasilar opacities likely atelectasis or scarring. No pneumothorax or large pleural effusion. No acute finding in the visualized skeleton. IMPRESSION: Scattered bibasilar opacities likely reflecting atelectasis or scarring. Electronically Signed   By: Audie Pinto M.D.   On: 10/12/2019  17:38   DG Hip Unilat With Pelvis 2-3 Views Right  Result Date: 10/12/2019 CLINICAL DATA:  [address] backwards landing on floor earlier today, RIGHT hip pain EXAM: DG HIP (WITH OR WITHOUT PELVIS) 2-3V RIGHT COMPARISON:  None FINDINGS: Osseous demineralization. SI joints poorly visualized. Mild narrowing of the hip joints bilaterally. Mildly displaced lesser trochanteric fracture. Lucency in the intertrochanteric region of the proximal RIGHT femur, suspicious for nondisplaced intertrochanteric fracture. A portion of the fracture plane is evident on the shoot through  lateral view but the superolateral portion of the fracture line is not clearly delineated. No dislocation or pelvic fracture seen. IMPRESSION: Suspected nondisplaced intertrochanteric fracture of the RIGHT femur with a mildly displaced lesser trochanteric fracture fragment. The superolateral extent of the fracture is not well demonstrated; this could be better delineated by CT. Electronically Signed   By: Lavonia Dana M.D.   On: 10/12/2019 17:42   DG Femur Min 2 Views Right  Result Date: 10/12/2019 CLINICAL DATA:  Pt states she fell back and landed on floor earlier today. Pt c/o right leg pain EXAM: RIGHT FEMUR 2 VIEWS COMPARISON:  Right hip radiographs 10/12/2019 FINDINGS: There is again a mildly displaced lesser trochanteric fracture, similar in appearance to prior. Subtle lucency in the inter trochanteric region suspicious for nondisplaced fracture. No evidence of dislocation. No new acute finding in the right femur. Status post right knee arthroplasty. Alignment appears intact. There are vascular calcifications in the regional soft tissues. IMPRESSION: No significant changes in the right hip from earlier prior radiographs demonstrating a mildly displaced lesser trochanteric fracture and lucency in the inter trochanteric region suspicious for nondisplaced fracture. Electronically Signed   By: Audie Pinto M.D.   On: 10/12/2019 20:13        Scheduled Meds: . acetaminophen  1,000 mg Oral TID  . ALPRAZolam  1 mg Oral QHS  . aspirin EC  81 mg Oral Daily  . atorvastatin  40 mg Oral Daily  . cholecalciferol  2,000 Units Oral Daily  . furosemide  40 mg Oral Daily  . gabapentin  300-600 mg Oral QHS  . heparin  5,000 Units Subcutaneous Q8H  . insulin aspart  0-9 Units Subcutaneous TID WC  . irbesartan  300 mg Oral Daily  . levothyroxine  112 mcg Oral QAC breakfast  . loratadine  10 mg Oral Daily  . neomycin-polymyxin-dexameth  1 application Both Eyes TID  . ofloxacin  1 drop Both Eyes See  admin instructions  . pantoprazole  40 mg Oral Daily  . senna  1 tablet Oral BID  . vitamin B-12  1,000 mcg Oral Daily   Continuous Infusions: . methocarbamol (ROBAXIN) IV            Aline August, MD [facility] 10/13/2019, 7:57 AM

## 2019-10-13 NOTE — Progress Notes (Signed)
Patient admitted to room from PACU.Alert and oriented x4. No  pain or discomfort at this time. Skin dry and warm to touch. Dressing to right hip and leg intact. No drainage noted. Will continue to monitor.

## 2019-10-13 NOTE — Anesthesia Preprocedure Evaluation (Addendum)
Anesthesia Evaluation  Patient identified by MRN, date of birth, ID band Patient awake    Reviewed: Allergy & Precautions, NPO status , Patient's Chart, lab work & pertinent test results  History of Anesthesia Complications (+) PONV  Airway Mallampati: II  TM Distance: >3 FB Neck ROM: Limited    Dental no notable dental hx.    Pulmonary COPD,  COPD inhaler,    Pulmonary exam normal breath sounds clear to auscultation       Cardiovascular hypertension, Pt. on medications +CHF  Normal cardiovascular exam Rhythm:Regular Rate:Normal  TTE 03/2019 1. The left ventricle is normal in size with mildly increased wall thickness.  2. The left ventricular systolic function is normal, LVEF is visually estimated at 55-60%.  3. There is grade I diastolic dysfunction (impaired relaxation).  4. There is mild mitral valve regurgitation.  5. The aortic valve is trileaflet with mildly thickened leaflets with normal excursion.  6. The left atrium is mildly dilated in size.  7. The right ventricle is normal in size, with normal systolic function.     Neuro/Psych PSYCHIATRIC DISORDERS Anxiety CVA (2012) negative neurological ROS  negative psych ROS   GI/Hepatic Neg liver ROS, GERD  Medicated,  Endo/Other  diabetesHypothyroidism   Renal/GU Renal InsufficiencyRenal disease (Cr 1.83, K 4.9)  negative genitourinary   Musculoskeletal negative musculoskeletal ROS (+)   Abdominal   Peds negative pediatric ROS (+)  Hematology  (+) Blood dyscrasia (Hgb 8.1), anemia ,   Anesthesia Other Findings Mechanical fall at home  Reproductive/Obstetrics negative OB ROS                           Anesthesia Physical Anesthesia Plan  ASA: III  Anesthesia Plan: Spinal   Post-op Pain Management:    Induction: Intravenous  PONV Risk Score and Plan: 3 and Dexamethasone, Ondansetron and Treatment may vary due to age  or medical condition  Airway Management Planned: Simple Face Mask  Additional Equipment:   Intra-op Plan:   Post-operative Plan:   Informed Consent: I have reviewed the patients History and Physical, chart, labs and discussed the procedure including the risks, benefits and alternatives for the proposed anesthesia with the patient or authorized representative who has indicated his/her understanding and acceptance.     Dental advisory given  Plan Discussed with: CRNA and Surgeon  Anesthesia Plan Comments: (Plan to transfuse 2U RBCs per surgeon request. )       Anesthesia Quick Evaluation

## 2019-10-13 NOTE — Anesthesia Procedure Notes (Signed)
Spinal  Patient location during procedure: OR Start time: 10/13/2019 2:53 PM End time: 10/13/2019 2:58 PM Staffing Anesthesiologist: Myrtie Soman, MD Preanesthetic Checklist Completed: patient identified, IV checked, site marked, risks and benefits discussed, surgical consent, monitors and equipment checked, pre-op evaluation and timeout performed Spinal Block Patient position: sitting Prep: DuraPrep Patient monitoring: heart rate, cardiac monitor, continuous pulse ox and blood pressure Approach: midline Location: L3-4 Injection technique: single-shot Needle Needle type: Quincke  Needle gauge: 22 G Needle length: 9 cm Assessment Sensory level: T6

## 2019-10-13 NOTE — Transfer of Care (Signed)
Immediate Anesthesia Transfer of Care Note  Patient: Catherine Rich  Procedure(s) Performed: INTRAMEDULLARY (IM) NAIL INTERTROCHANTRIC (Right Hip)  Patient Location: PACU  Anesthesia Type:Spinal  Level of Consciousness: awake and patient cooperative  Airway & Oxygen Therapy: Patient Spontanous Breathing and Patient connected to face mask oxygen  Post-op Assessment: Report given to RN and Post -op Vital signs reviewed and stable  Post vital signs: Reviewed and stable  Last Vitals:  Vitals Value Taken Time  BP 121/45 10/13/19 1636  Temp    Pulse 64 10/13/19 1639  Resp 14 10/13/19 1639  SpO2 100 % 10/13/19 1639  Vitals shown include unvalidated device data.  Last Pain:  Vitals:   10/12/19 1840  TempSrc:   PainSc: 3          Complications: No complications documented.

## 2019-10-13 NOTE — ED Notes (Signed)
ED TO INPATIENT HANDOFF REPORT  ED Nurse Name and Phone #: [phone]  S Name/Age/Gender Catherine Rich 86 y.o. female Room/Bed: APA04/APA04  Code Status   Code Status: DNR  Home/SNF/Other Home Patient oriented to: self, place, time and situation Is this baseline? Yes   Triage Complete: Triage complete  Chief Complaint Intertrochanteric fx-closed, right, initial encounter [facility]) [S72.141A]  Triage Note Pt c/o of leg pain     Allergies Allergies  Allergen Reactions  . Penicillins Anaphylaxis and Rash    Did it involve swelling of the face/tongue/throat, SOB, or low BP? Yes Did it involve sudden or severe rash/hives, skin peeling, or any reaction on the inside of your mouth or nose? Yes Did you need to seek medical attention at a hospital or doctor's office? Yes When did it last happen?68 YEARS AGO , IN HER 30S If all above answers are "NO", may proceed with cephalosporin use.   . Betadine [Povidone Iodine] Other (See Comments)    IRRITATION AROUND EYES WHEN GETTING EYE INJECTIONS   . Eggs Or Egg-Derived Products Diarrhea and Nausea And Vomiting  . Other     icecream-diarrhea  . Codeine Nausea And Vomiting  . Sulfa Antibiotics Nausea And Vomiting    Level of Care/Admitting Diagnosis ED Disposition    ED Disposition Condition [facility] Area: [place] [[id number]]  Level of Care: Med-Surg [16]  May admit patient to [facility] or [facility] if equivalent level of care is available:: No  Covid Evaluation: Confirmed COVID Negative  Date Laboratory Confirmed COVID Negative: 10/12/2019  Diagnosis: Intertrochanteric fx-closed, right, initial encounter [facility]) [[id number]]  Admitting Physician: Neena Rhymes [[id number]]  Attending Physician: Adella Hare E [[id number]]  Estimated length of stay: 3 - 4 days  Certification:: I certify this patient will need inpatient services for at least 2 midnights       B Medical/Surgery History Past  Medical History:  Diagnosis Date  . Anemia   . Arthritis   . Chronic kidney disease    only one funtioning kidney  . Complication of anesthesia   . Diabetes mellitus without complication ([place])   . GERD (gastroesophageal reflux disease)   . Hypertension   . Hypothyroidism   . Macular degeneration, bilateral   . PONV (postoperative nausea and vomiting)   . Stroke [facility]) 01/19/2011   Past Surgical History:  Procedure Laterality Date  . ABDOMINAL HYSTERECTOMY  1970  . BACK SURGERY  01/02/2018   L4-L5   . EYE SURGERY    . TOTAL KNEE ARTHROPLASTY Right 08/02/2018   Procedure: TOTAL KNEE ARTHROPLASTY;  Surgeon: Sydnee Cabal, MD;  Location: [facility] ORS;  Service: Orthopedics;  Laterality: Right;  with adductor canal  . VASCULAR SURGERY  11/2013   AAA     A IV Location/Drains/Wounds Patient Lines/Drains/Airways Status    Active Line/Drains/Airways    Name Placement date Placement time Site Days   Peripheral IV 10/12/19 Anterior;Proximal;Right Forearm 10/12/19  1748  Forearm  1   Incision (Closed) 08/02/18 Knee Right 08/02/18  0959   437          Intake/Output Last 24 hours  Intake/Output Summary (Last 24 hours) at 10/13/2019 1219 Last data filed at 10/13/2019 1023 Gross per 24 hour  Intake 679.17 ml  Output --  Net 679.17 ml    Labs/Imaging Results for orders placed or performed during the hospital encounter of 10/12/19 (from the past 48 hour(s))  Basic metabolic panel  Status: Abnormal   Collection Time: 10/12/19  5:12 PM  Result Value Ref Range   Sodium 135 135 - 145 mmol/L   Potassium 4.9 3.5 - 5.1 mmol/L   Chloride 102 98 - 111 mmol/L   CO2 21 (L) 22 - 32 mmol/L   Glucose, Bld 181 (H) 70 - 99 mg/dL    Comment: Glucose reference range applies only to samples taken after fasting for at least 8 hours.   BUN 30 (H) 8 - 23 mg/dL   Creatinine, Ser 1.72 (H) 0.44 - 1.00 mg/dL   Calcium 8.8 (L) 8.9 - 10.3 mg/dL   GFR calc non Af Amer 26 (L) >60 mL/min   GFR calc Af Amer  30 (L) >60 mL/min   Anion gap 12 5 - 15    Comment: Performed at [facility], [address] [place], [place] [address]  CBC WITH DIFFERENTIAL     Status: Abnormal   Collection Time: 10/12/19  5:12 PM  Result Value Ref Range   WBC 5.6 4.0 - 10.5 K/uL   RBC 4.32 3.87 - 5.11 MIL/uL   Hemoglobin 8.2 (L) 12.0 - 15.0 g/dL    Comment: Reticulocyte Hemoglobin testing may be clinically indicated, consider ordering this additional test [id number]    HCT 30.3 (L) 36 - 46 %   MCV 70.1 (L) 80.0 - 100.0 fL   MCH 19.0 (L) 26.0 - 34.0 pg   MCHC 27.1 (L) 30.0 - 36.0 g/dL   RDW 20.6 (H) 11.5 - 15.5 %   Platelets 159 150 - 400 K/uL   nRBC 0.0 0.0 - 0.2 %   Neutrophils Relative % 50 %   Neutro Abs 2.9 1.7 - 7.7 K/uL   Lymphocytes Relative 33 %   Lymphs Abs 1.9 0.7 - 4.0 K/uL   Monocytes Relative 11 %   Monocytes Absolute 0.6 0 - 1 K/uL   Eosinophils Relative 4 %   Eosinophils Absolute 0.2 0 - 0 K/uL   Basophils Relative 1 %   Basophils Absolute 0.0 0 - 0 K/uL   Immature Granulocytes 1 %   Abs Immature Granulocytes 0.03 0.00 - 0.07 K/uL    Comment: Performed at [facility], [address] [place], [place] [address]  Protime-INR     Status: None   Collection Time: 10/12/19  5:12 PM  Result Value Ref Range   Prothrombin Time 13.4 11.4 - 15.2 seconds   INR 1.1 0.8 - 1.2    Comment: (NOTE) INR goal varies based on device and disease states. Performed at [facility], [address], [place], [place] [address]   Type and screen [facility]     Status: None   Collection Time: 10/12/19  5:12 PM  Result Value Ref Range   ABO/RH(D) A POS    Antibody Screen NEG    Sample Expiration      10/15/2019,2359 Performed at [facility], [address] [place], [place] [address]   Hemoglobin A1c     Status: Abnormal   Collection Time: 10/12/19  5:12 PM  Result Value Ref Range   Hgb A1c MFr Bld 11.2 (H) 4.8 - 5.6 %    Comment: (NOTE) Pre diabetes:          5.7%-6.4%  Diabetes:               >6.4%  Glycemic control for   <7.0% adults with diabetes    Mean Plasma Glucose 274.74 mg/dL    Comment: Performed at [facility], [place] [address], [place], [place] [address]  SARS Coronavirus 2 by RT PCR (hospital order, performed in [facility] hospital lab) Nasopharyngeal Nasopharyngeal Swab     Status: None   Collection Time: 10/12/19  6:57 PM   Specimen: Nasopharyngeal Swab  Result Value Ref Range   SARS Coronavirus 2 NEGATIVE NEGATIVE    Comment: (NOTE) SARS-CoV-2 target nucleic acids are NOT DETECTED.  The SARS-CoV-2 RNA is generally detectable in upper and lower respiratory specimens during the acute phase of infection. The lowest concentration of SARS-CoV-2 viral copies this assay can detect is 250 copies / mL. A negative result does not preclude SARS-CoV-2 infection and should not be used as the sole basis for treatment or other patient management decisions.  A negative result may occur with improper specimen collection / handling, submission of specimen other than nasopharyngeal swab, presence of viral mutation(s) within the areas targeted by this assay, and inadequate number of viral copies (<250 copies / mL). A negative result must be combined with clinical observations, patient history, and epidemiological information.  Fact Sheet for Patients:   StrictlyIdeas.no  Fact Sheet for Healthcare Providers: BankingDealers.co.za  This test is not yet approved or  cleared by the [place] FDA and has been authorized for detection and/or diagnosis of SARS-CoV-2 by FDA under an Emergency Use Authorization (EUA).  This EUA will remain in effect (meaning this test can be used) for the duration of the COVID-19 declaration under Section 564(b)(1) of the Act, 21 U.S.C. section 360bbb-3(b)(1), unless the authorization is terminated or revoked sooner.  Performed at [facility], [address], [place], [place] [address]    CBG monitoring, ED     Status: Abnormal   Collection Time: 10/13/19  2:21 AM  Result Value Ref Range   Glucose-Capillary 197 (H) 70 - 99 mg/dL    Comment: Glucose reference range applies only to samples taken after fasting for at least 8 hours.  Basic metabolic panel     Status: Abnormal   Collection Time: 10/13/19  7:43 AM  Result Value Ref Range   Sodium 135 135 - 145 mmol/L   Potassium 4.7 3.5 - 5.1 mmol/L   Chloride 103 98 - 111 mmol/L   CO2 24 22 - 32 mmol/L   Glucose, Bld 173 (H) 70 - 99 mg/dL    Comment: Glucose reference range applies only to samples taken after fasting for at least 8 hours.   BUN 30 (H) 8 - 23 mg/dL   Creatinine, Ser 1.86 (H) 0.44 - 1.00 mg/dL   Calcium 8.1 (L) 8.9 - 10.3 mg/dL   GFR calc non Af Amer 24 (L) >60 mL/min   GFR calc Af Amer 27 (L) >60 mL/min   Anion gap 8 5 - 15    Comment: Performed at [facility], [address], [place],  [address]  CBG monitoring, ED     Status: Abnormal   Collection Time: 10/13/19  8:23 AM  Result Value Ref Range   Glucose-Capillary 159 (H) 70 - 99 mg/dL    Comment: Glucose reference range applies only to samples taken after fasting for at least 8 hours.  CBC with Differential/Platelet     Status: Abnormal   Collection Time: 10/13/19  9:59 AM  Result Value Ref Range   WBC 5.6 4.0 - 10.5 K/uL   RBC 4.26 3.87 - 5.11 MIL/uL   Hemoglobin 8.1 (L) 12.0 - 15.0 g/dL    Comment: Reticulocyte Hemoglobin testing may be clinically  indicated, consider ordering this additional test [id number]    HCT 30.5 (L) 36 - 46 %   MCV 71.6 (L) 80.0 - 100.0 fL   MCH 19.0 (L) 26.0 - 34.0 pg   MCHC 26.6 (L) 30.0 - 36.0 g/dL   RDW 20.8 (H) 11.5 - 15.5 %   Platelets 128 (L) 150 - 400 K/uL   nRBC 0.0 0.0 - 0.2 %   Neutrophils Relative % 59 %   Neutro Abs 3.4 1.7 - 7.7 K/uL   Lymphocytes Relative 25 %   Lymphs Abs 1.4 0.7 - 4.0 K/uL   Monocytes Relative 10 %   Monocytes Absolute 0.5 0 - 1 K/uL   Eosinophils Relative 4 %    Eosinophils Absolute 0.2 0 - 0 K/uL   Basophils Relative 1 %   Basophils Absolute 0.1 0 - 0 K/uL   Immature Granulocytes 1 %   Abs Immature Granulocytes 0.03 0.00 - 0.07 K/uL    Comment: Performed at [facility], [address] [place], [place] [address]  Comprehensive metabolic panel     Status: Abnormal   Collection Time: 10/13/19  9:59 AM  Result Value Ref Range   Sodium 135 135 - 145 mmol/L   Potassium 4.9 3.5 - 5.1 mmol/L   Chloride 104 98 - 111 mmol/L   CO2 23 22 - 32 mmol/L   Glucose, Bld 175 (H) 70 - 99 mg/dL    Comment: Glucose reference range applies only to samples taken after fasting for at least 8 hours.   BUN 30 (H) 8 - 23 mg/dL   Creatinine, Ser 1.83 (H) 0.44 - 1.00 mg/dL   Calcium 8.3 (L) 8.9 - 10.3 mg/dL   Total Protein 6.6 6.5 - 8.1 g/dL   Albumin 3.1 (L) 3.5 - 5.0 g/dL   AST 19 15 - 41 U/L   ALT 14 0 - 44 U/L   Alkaline Phosphatase 78 38 - 126 U/L   Total Bilirubin 1.1 0.3 - 1.2 mg/dL   GFR calc non Af Amer 24 (L) >60 mL/min   GFR calc Af Amer 28 (L) >60 mL/min   Anion gap 8 5 - 15    Comment: Performed at [facility], [address] [place], [place] [address]  Magnesium     Status: None   Collection Time: 10/13/19  9:59 AM  Result Value Ref Range   Magnesium 1.7 1.7 - 2.4 mg/dL    Comment: Performed at [facility], [address] [place], [place] [address]   DG Chest Port 1 View  Result Date: 10/12/2019 CLINICAL DATA:  Pt states she fell back and landed on floor earlier today. Pt c/o right leg pain EXAM: PORTABLE CHEST 1 VIEW COMPARISON:  Chest radiograph 02/08/2019 FINDINGS: Stable cardiomediastinal contours with aortic stent graft in place. Enlarged heart size. There are scattered bibasilar opacities likely atelectasis or scarring. No pneumothorax or large pleural effusion. No acute finding in the visualized skeleton. IMPRESSION: Scattered bibasilar opacities likely reflecting atelectasis or scarring. Electronically Signed   By: Audie Pinto M.D.    On: 10/12/2019 17:38   DG Hip Unilat With Pelvis 2-3 Views Right  Result Date: 10/12/2019 CLINICAL DATA:  [address] [address] backwards landing on floor earlier today, RIGHT hip pain EXAM: DG HIP (WITH OR WITHOUT PELVIS) 2-3V RIGHT COMPARISON:  None FINDINGS: Osseous demineralization. SI joints poorly visualized. Mild narrowing of the hip joints bilaterally. Mildly displaced lesser trochanteric fracture. Lucency in the intertrochanteric region of the proximal RIGHT femur, suspicious for nondisplaced  intertrochanteric fracture. A portion of the fracture plane is evident on the shoot through lateral view but the superolateral portion of the fracture line is not clearly delineated. No dislocation or pelvic fracture seen. IMPRESSION: Suspected nondisplaced intertrochanteric fracture of the RIGHT femur with a mildly displaced lesser trochanteric fracture fragment. The superolateral extent of the fracture is not well demonstrated; this could be better delineated by CT. Electronically Signed   By: Lavonia Dana M.D.   On: 10/12/2019 17:42   DG Femur Min 2 Views Right  Result Date: 10/12/2019 CLINICAL DATA:  Pt states she fell back and landed on floor earlier today. Pt c/o right leg pain EXAM: RIGHT FEMUR 2 VIEWS COMPARISON:  Right hip radiographs 10/12/2019 FINDINGS: There is again a mildly displaced lesser trochanteric fracture, similar in appearance to prior. Subtle lucency in the inter trochanteric region suspicious for nondisplaced fracture. No evidence of dislocation. No new acute finding in the right femur. Status post right knee arthroplasty. Alignment appears intact. There are vascular calcifications in the regional soft tissues. IMPRESSION: No significant changes in the right hip from earlier prior radiographs demonstrating a mildly displaced lesser trochanteric fracture and lucency in the inter trochanteric region suspicious for nondisplaced fracture. Electronically Signed   By: Audie Pinto M.D.   On: 10/12/2019  20:13    Pending Labs Unresulted Labs (From admission, onward)          Start     Ordered   10/14/19 0500  CBC with Differential/Platelet  Tomorrow morning,   R        10/13/19 0805   10/14/19 9485  Basic metabolic panel  Tomorrow morning,   R        10/13/19 0805   10/14/19 0500  Magnesium  Tomorrow morning,   R        10/13/19 0805          Vitals/Pain Today's Vitals   10/13/19 0930 10/13/19 1000 10/13/19 1030 10/13/19 1100  BP: (!) 148/70 (!) 154/106 (!) 139/49 (!) 111/50  Pulse: 64 64 65 (!) 56  Resp: (!) 21 (!) 21 (!) 25 10  Temp:      TempSrc:      SpO2: 90% 96% 96% 94%  Weight:      Height:      PainSc:        Isolation Precautions No active isolations  Medications Medications  ondansetron (ZOFRAN) injection 4 mg (4 mg Intravenous Given 10/12/19 1748)  atorvastatin (LIPITOR) tablet 40 mg (40 mg Oral Given 10/13/19 0931)  furosemide (LASIX) tablet 40 mg (40 mg Oral Given 10/13/19 0931)  irbesartan (AVAPRO) tablet 300 mg (300 mg Oral Given 10/13/19 0931)  ALPRAZolam (XANAX) tablet 1 mg (1 mg Oral Refused 10/13/19 0019)  levothyroxine (SYNTHROID) tablet 112 mcg (112 mcg Oral Given 10/13/19 0820)  vitamin B-12 (CYANOCOBALAMIN) tablet 1,000 mcg (1,000 mcg Oral Given 10/13/19 0931)  gabapentin (NEURONTIN) capsule 300-600 mg (300 mg Oral Refused 10/13/19 0020)  cholecalciferol (VITAMIN D3) tablet 2,000 Units (2,000 Units Oral Given 10/13/19 0931)  loratadine (CLARITIN) tablet 10 mg (10 mg Oral Given 10/13/19 0931)  ofloxacin (OCUFLOX) 0.3 % ophthalmic solution 1 drop (has no administration in time range)  heparin injection 5,000 Units (5,000 Units Subcutaneous Not Given 10/13/19 0820)  0.45 % sodium chloride infusion ( Intravenous Stopped 10/13/19 0820)  HYDROmorphone (DILAUDID) injection 0.5 mg (has no administration in time range)  methocarbamol (ROBAXIN) tablet 500 mg (500 mg Oral Given 10/13/19 0930)    Or  methocarbamol (ROBAXIN) 500 mg in dextrose 5 % 50 mL IVPB (  Intravenous See Alternative 10/13/19 0930)  senna (SENOKOT) tablet 8.6 mg (8.6 mg Oral Not Given 10/13/19 0933)  acetaminophen (TYLENOL) tablet 1,000 mg (1,000 mg Oral Not Given 10/13/19 0933)  insulin aspart (novoLOG) injection 0-9 Units (2 Units Subcutaneous Given 10/13/19 0930)  pantoprazole (PROTONIX) EC tablet 40 mg (40 mg Oral Given 10/13/19 0936)  neomycin-polymyxin b-dexamethasone (MAXITROL) ophthalmic suspension 2 drop (2 drops Both Eyes Not Given 10/13/19 0932)  0.45 % sodium chloride infusion ( Intravenous Rate/Dose Verify 10/13/19 1023)  fentaNYL (SUBLIMAZE) injection 50 mcg (50 mcg Intravenous Given 10/13/19 1029)  cloNIDine (CATAPRES) tablet 0.2 mg (0.2 mg Oral Given 10/12/19 1842)    Mobility walks High fall risk   Focused Assessments    R Recommendations: See Admitting Provider Note  Report given to:   Additional Notes

## 2019-10-13 NOTE — Op Note (Signed)
OPERATIVE REPORT  SURGEON: Rod Can, MD   ASSISTANT: Cherlynn June, PA-C.  PREOPERATIVE DIAGNOSIS: Right intertrochanteric femur fracture.   POSTOPERATIVE DIAGNOSIS: Right intertrochanteric femur fracture.   PROCEDURE: Intramedullary fixation, Right femur.   IMPLANTS: Biomet Affixus Hip Fracture Nail, 9 by 180 mm, 125 degrees. 10.5 x 100 mm Hip Fracture Nail Lag Screw. 5 x 34 mm distal interlocking screw 1.  ANESTHESIA:  Spinal  ESTIMATED BLOOD LOSS:-150 mL    ANTIBIOTICS: 2g vancomycin (PCN allergy).  DRAINS: None.  BLOOD PRODUCTS: 1 unit PRBCs.  COMPLICATIONS: None.   CONDITION: PACU - hemodynamically stable.Catherine Rich   BRIEF CLINICAL NOTE: Catherine Rich is a 86 y.o. female who presented with an intertrochanteric femur fracture. The patient was admitted to the hospitalist service and underwent perioperative risk stratification and medical optimization. The risks, benefits, and alternatives to the procedure were explained, and the patient elected to proceed.  PROCEDURE IN DETAIL: Surgical site was marked by myself. The patient was taken to the operating room and anesthesia was induced on the bed. The patient was then transferred to the [facility] table and the nonoperative lower extremity was scissored underneath the operative side. The fracture was reduced with traction, internal rotation, and adduction. The hip was prepped and draped in the normal sterile surgical fashion. Timeout was called verifying side and site of surgery. Preop antibiotics were given with 60 minutes of beginning the procedure.  Fluoroscopy was used to define the patient's anatomy. A 4 cm incision was made just proximal to the tip of the greater trochanter. The awl was used to obtain the standard starting point for a trochanteric entry nail under fluoroscopic control. The guidepin was placed. The entry reamer was used to open the proximal femur.  On the back table, the nail was assembled onto the jig. The  nail was placed into the femur without any difficulty. Through a separate stab incision, the cannula was placed down to the bone in preparation for the cephalomedullary device. A guidepin was placed into the femoral head using AP and lateral fluoroscopy views. The pin was measured, and then reaming was performed to the appropriate depth. The lag screw was inserted to the appropriate depth. The fracture was compressed through the jig. The setscrew was tightened and then loosened one quarter turn. A separate stab incision was created, and the distal interlocking screw was placed using standard AO technique. The jig was removed. Final AP and lateral fluoroscopy views were obtained to confirm fracture reduction and hardware placement. Tip apex distance was appropriate. There was no chondral penetration.  The wounds were copiously irrigated with saline. The wound was closed in layers with #1 Vicryl for the fascia, 2-0 Monocryl for the deep dermal layer, and 3-0 Monocryl subcuticular stitch. Glue was applied to the skin. Once the glue was fully hardened, sterile dressing was applied. The patient was then awakened from anesthesia and taken to the PACU in stable condition. Sponge needle and instrument counts were correct at the end of the case 2. There were no known complications.  We will readmit the patient to the hospitalist. Weightbearing status will be weightbearing as tolerated with a walker. We will begin ASA for DVT prophylaxis. The patient will work with physical therapy and undergo disposition planning.  Please note that a surgical assistant was a medical necessity for this procedure to perform it in a safe and expeditious manner. Assistant was necessary to provide appropriate retraction of vital neurovascular structures, to prevent femoral fracture, and to allow for anatomic  placement of the prosthesis.

## 2019-10-13 NOTE — Anesthesia Postprocedure Evaluation (Signed)
Anesthesia Post Note  Patient: Catherine Rich  Procedure(s) Performed: INTRAMEDULLARY (IM) NAIL INTERTROCHANTRIC (Right Hip)     Patient location during evaluation: PACU Anesthesia Type: Spinal Level of consciousness: awake and alert, patient cooperative and oriented Pain management: pain level controlled Vital Signs Assessment: post-procedure vital signs reviewed and stable Respiratory status: spontaneous breathing, nonlabored ventilation, respiratory function stable and patient connected to nasal cannula oxygen Cardiovascular status: blood pressure returned to baseline and stable Postop Assessment: no apparent nausea or vomiting, spinal receding and patient able to bend at knees Anesthetic complications: no   No complications documented.  Last Vitals:  Vitals:   10/13/19 1720 10/13/19 1735  BP: (!) 151/50 (!) 156/55  Pulse: 71 64  Resp: 12 (!) 131  Temp:  36.4 C  SpO2: 98% 98%    Last Pain:  Vitals:   10/13/19 1735  TempSrc:   PainSc: 5         RLE Motor Response: Purposeful movement;Responds to commands (10/13/19 1735) RLE Sensation: Full sensation (10/13/19 1735) L Sensory Level: L5-Outer lower leg, top of foot, great toe (10/13/19 1735) R Sensory Level: L5-Outer lower leg, top of foot, great toe (10/13/19 1735)  Seleta Rhymes. Chavon Lucarelli

## 2019-10-13 NOTE — Anesthesia Procedure Notes (Signed)
Procedure Name: MAC Date/Time: 10/13/2019 2:50 PM Performed by: Renato Shin, CRNA Pre-anesthesia Checklist: Patient identified, Emergency Drugs available and Suction available Patient Re-evaluated:Patient Re-evaluated prior to induction Oxygen Delivery Method: Simple face mask Preoxygenation: Pre-oxygenation with 100% oxygen Induction Type: IV induction Placement Confirmation: positive ETCO2 and breath sounds checked- equal and bilateral Dental Injury: Teeth and Oropharynx as per pre-operative assessment

## 2019-10-13 NOTE — Discharge Instructions (Signed)
 Dr. Merrilyn Legler Adult Hip & Knee Specialist [facility] [address] [place], [place] [address] [phone]   POSTOPERATIVE DIRECTIONS    Hip Rehabilitation, Guidelines Following Surgery   WEIGHT BEARING Weight bearing as tolerated with assist device (walker, cane, etc) as directed, use it as long as suggested by your surgeon or therapist, typically at least 4-6 weeks.   HOME CARE INSTRUCTIONS  Remove items at home which could result in a fall. This includes throw rugs or furniture in walking pathways.  Continue medications as instructed at time of discharge.  You may have some home medications which will be placed on hold until you complete the course of blood thinner medication.  4 days after discharge, you may start showering. No tub baths or soaking your incisions. Do not put on socks or shoes without following the instructions of your caregivers.   Sit on chairs with arms. Use the chair arms to help push yourself up when arising.  Arrange for the use of a toilet seat elevator so you are not sitting low.   Walk with walker as instructed.  You may resume a sexual relationship in one month or when given the OK by your caregiver.  Use walker as long as suggested by your caregivers.  Avoid periods of inactivity such as sitting longer than an hour when not asleep. This helps prevent blood clots.  You may return to work once you are cleared by your surgeon.  Do not drive a car for 6 weeks or until released by your surgeon.  Do not drive while taking narcotics.  Wear elastic stockings for two weeks following surgery during the day but you may remove then at night.  Make sure you keep all of your appointments after your operation with all of your doctors and caregivers. You should call the office at the above phone number and make an appointment for approximately two weeks after the date of your surgery. Please pick up a stool softener and laxative  for home use as long as you are requiring pain medications.  ICE to the affected hip every three hours for 30 minutes at a time and then as needed for pain and swelling. Continue to use ice on the hip for pain and swelling from surgery. You may notice swelling that will progress down to the foot and ankle.  This is normal after surgery.  Elevate the leg when you are not up walking on it.   It is important for you to complete the blood thinner medication as prescribed by your doctor.  Continue to use the breathing machine which will help keep your temperature down.  It is common for your temperature to cycle up and down following surgery, especially at night when you are not up moving around and exerting yourself.  The breathing machine keeps your lungs expanded and your temperature down.  RANGE OF MOTION AND STRENGTHENING EXERCISES  These exercises are designed to help you keep full movement of your hip joint. Follow your caregiver's or physical therapist's instructions. Perform all exercises about fifteen times, three times per day or as directed. Exercise both hips, even if you have had only one joint replacement. These exercises can be done on a training (exercise) mat, on the floor, on a table or on a bed. Use whatever works the best and is most comfortable for you. Use music or television while you are exercising so that the exercises are a pleasant break in your day. This   will make your life better with the exercises acting as a break in routine you can look forward to.  Lying on your back, slowly slide your foot toward your buttocks, raising your knee up off the floor. Then slowly slide your foot back down until your leg is straight again.  Lying on your back spread your legs as far apart as you can without causing discomfort.  Lying on your side, raise your upper leg and foot straight up from the floor as far as is comfortable. Slowly lower the leg and repeat.  Lying on your back, tighten up the  muscle in the front of your thigh (quadriceps muscles). You can do this by keeping your leg straight and trying to raise your heel off the floor. This helps strengthen the largest muscle supporting your knee.  Lying on your back, tighten up the muscles of your buttocks both with the legs straight and with the knee bent at a comfortable angle while keeping your heel on the floor.   SKILLED REHAB INSTRUCTIONS: If the patient is transferred to a skilled rehab facility following release from the hospital, a list of the current medications will be sent to the facility for the patient to continue.  When discharged from the skilled rehab facility, please have the facility set up the patient's Home Health Physical Therapy prior to being released. Also, the skilled facility will be responsible for providing the patient with their medications at time of release from the facility to include their pain medication and their blood thinner medication. If the patient is still at the rehab facility at time of the two week follow up appointment, the skilled rehab facility will also need to assist the patient in arranging follow up appointment in our office and any transportation needs.  MAKE SURE YOU:  Understand these instructions.  Will watch your condition.  Will get help right away if you are not doing well or get worse.  Pick up stool softner and laxative for home use following surgery while on pain medications. Daily dry dressing changes as needed. In 4 days, you may remove your dressings and begin taking showers - no tub baths or soaking the incisions. Continue to use ice for pain and swelling after surgery. Do not use any lotions or creams on the incision until instructed by your surgeon.   

## 2019-10-14 ENCOUNTER — Encounter (HOSPITAL_COMMUNITY): Payer: Self-pay | Admitting: Orthopedic Surgery

## 2019-10-14 DIAGNOSIS — S72144A Nondisplaced intertrochanteric fracture of right femur, initial encounter for closed fracture: Principal | ICD-10-CM

## 2019-10-14 DIAGNOSIS — Z419 Encounter for procedure for purposes other than remedying health state, unspecified: Secondary | ICD-10-CM

## 2019-10-14 LAB — CBC WITH DIFFERENTIAL/PLATELET
Abs Immature Granulocytes: 0.06 10*3/uL (ref 0.00–0.07)
Basophils Absolute: 0.1 10*3/uL (ref 0.0–0.1)
Basophils Relative: 1 %
Eosinophils Absolute: 0.3 10*3/uL (ref 0.0–0.5)
Eosinophils Relative: 4 %
HCT: 30.2 % — ABNORMAL LOW (ref 36.0–46.0)
Hemoglobin: 8.3 g/dL — ABNORMAL LOW (ref 12.0–15.0)
Immature Granulocytes: 1 %
Lymphocytes Relative: 20 %
Lymphs Abs: 1.4 10*3/uL (ref 0.7–4.0)
MCH: 19.9 pg — ABNORMAL LOW (ref 26.0–34.0)
MCHC: 27.5 g/dL — ABNORMAL LOW (ref 30.0–36.0)
MCV: 72.4 fL — ABNORMAL LOW (ref 80.0–100.0)
Monocytes Absolute: 0.8 10*3/uL (ref 0.1–1.0)
Monocytes Relative: 11 %
Neutro Abs: 4.4 10*3/uL (ref 1.7–7.7)
Neutrophils Relative %: 63 %
Platelets: 136 10*3/uL — ABNORMAL LOW (ref 150–400)
RBC: 4.17 MIL/uL (ref 3.87–5.11)
RDW: 21.4 % — ABNORMAL HIGH (ref 11.5–15.5)
WBC: 6.9 10*3/uL (ref 4.0–10.5)
nRBC: 0 % (ref 0.0–0.2)

## 2019-10-14 LAB — GLUCOSE, CAPILLARY
Glucose-Capillary: 168 mg/dL — ABNORMAL HIGH (ref 70–99)
Glucose-Capillary: 237 mg/dL — ABNORMAL HIGH (ref 70–99)
Glucose-Capillary: 143 mg/dL — ABNORMAL HIGH (ref 70–99)
Glucose-Capillary: 175 mg/dL — ABNORMAL HIGH (ref 70–99)

## 2019-10-14 LAB — BASIC METABOLIC PANEL
Anion gap: 8 (ref 5–15)
BUN: 27 mg/dL — ABNORMAL HIGH (ref 8–23)
CO2: 27 mmol/L (ref 22–32)
Calcium: 8.4 mg/dL — ABNORMAL LOW (ref 8.9–10.3)
Chloride: 100 mmol/L (ref 98–111)
Creatinine, Ser: 1.99 mg/dL — ABNORMAL HIGH (ref 0.44–1.00)
GFR calc Af Amer: 25 mL/min — ABNORMAL LOW (ref >60)
GFR calc non Af Amer: 22 mL/min — ABNORMAL LOW (ref >60)
Glucose, Bld: 192 mg/dL — ABNORMAL HIGH (ref 70–99)
Potassium: 4.8 mmol/L (ref 3.5–5.1)
Sodium: 135 mmol/L (ref 135–145)

## 2019-10-14 MED ORDER — ASPIRIN 81 MG PO CHEW: 81.0000 mg | CHEWABLE_TABLET | Freq: Two times a day (BID) | ORAL | 0 refills | Status: AC

## 2019-10-14 MED ORDER — CHLORHEXIDINE GLUCONATE CLOTH 2 % EX PADS
6.0000 | MEDICATED_PAD | Freq: Every day | CUTANEOUS | Status: DC
  Administered 2019-10-14 – 2019-10-17 (×5): 6 via TOPICAL

## 2019-10-14 MED ORDER — CYCLOBENZAPRINE HCL 5 MG PO TABS
7.5000 mg | ORAL_TABLET | Freq: Three times a day (TID) | ORAL | Status: DC | PRN
  Administered 2019-10-14 – 2019-10-16 (×6): 7.5 mg via ORAL
  Filled 2019-10-14 (×8): qty 1.5

## 2019-10-14 MED ORDER — HYDROMORPHONE HCL 1 MG/ML IJ SOLN: 0.5000 mg | INTRAMUSCULAR | Status: DC | PRN

## 2019-10-14 MED ORDER — OXYCODONE-ACETAMINOPHEN 7.5-325 MG PO TABS: 1.0000 | ORAL_TABLET | ORAL | 0 refills | Status: AC | PRN

## 2019-10-14 MED ORDER — HYDROMORPHONE HCL 1 MG/ML IJ SOLN
0.5000 mg | INTRAMUSCULAR | Status: AC
  Administered 2019-10-14: 0.5 mg via INTRAVENOUS
  Filled 2019-10-14: qty 0.5

## 2019-10-14 MED ORDER — MAGNESIUM SULFATE 4 GM/100ML IV SOLN
4.0000 g | Freq: Once | INTRAVENOUS | Status: AC
  Administered 2019-10-14: 4 g via INTRAVENOUS
  Filled 2019-10-14: qty 100

## 2019-10-14 NOTE — Progress Notes (Addendum)
Inpatient Diabetes Program Recommendations  AACE/ADA: New Consensus Statement on Inpatient Glycemic Control (2015)  Target Ranges:  Prepandial:   less than 140 mg/dL      Peak postprandial:   less than 180 mg/dL (1-2 hours)      Critically ill patients:  140 - 180 mg/dL   Lab Results  Component Value Date   GLUCAP 168 (H) 10/14/2019   HGBA1C 11.2 (H) 10/12/2019    Review of Glycemic Control  Diabetes history: DM 2  Outpatient Diabetes medications: none Current orders for Inpatient glycemic control:  Novolog 0-9 units tid  Inpatient Diabetes Program Recommendations:    Will see pt today. A1c 11.2% goal 8% or less. Based on glucose trends here may only need Tradjenta 5 mg Daily (fecally cleared) at home with monitoring. Glucose trends to stay less than 180 at goal.  1:40 pm: Spoke with pt and daughter at bedside. Pt had been on glipizide 2.5 mg Daily in the past. With pt's renal function daughter does not want certain medications. Daughter reports pt snack heavily at home, which is also reflective of A1c level and glucose trends while in the hospital.  Pt had some of her potatoes, slice of bread and orange sherbet for breakfast, glucose 230's at lunchtime.  PT has meter at home she has been checking her glucose at home even she has not been on medication.  Discussed Tradjenta and monitoring at home. Discussed with pt to not snack and eat a balanced meal with a protein for wound healing.  Thanks,  Tama Headings RN, MSN, BC-ADM Inpatient Diabetes Coordinator Team Pager [phone] (8a-5p)

## 2019-10-14 NOTE — Progress Notes (Addendum)
PROGRESS NOTE    Catherine Rich  YTK:[id number] DOB: 05-30-1928 DOA: 10/12/2019 PCP: Caryl Bis, MD   Brief Narrative: 86 year old female transferred from [facility] with nondisplaced right intertrochanteric fracture per Ortho now status post surgery.  She has history of CKD stage III hypertension type 2 diabetes hyperlipidemia COPD hypothyroidism GERD macular degeneration and chronic hypoxic respiratory failure on O2 at home. Assessment & Plan:   Active Problems:   Hypothyroidism   HTN (hypertension)   Chronic respiratory failure with hypoxia (HCC)   DM (diabetes mellitus) type II controlled with renal manifestation (HCC)   Intertrochanteric fx-closed, right, initial encounter ([place])   Chronic diastolic CHF (congestive heart failure) ([place])   #1 nondisplaced right intertrochanteric fracture Seen by physical therapy recommending skilled nursing facility. Weightbearing as tolerated with walker Aspirin for DVT prophylaxis New stool softeners  #2 history of chronic diastolic heart failure on Lasix and irbesartan at home.  Lasix currently on hold since she was n.p.o. for the surgery and she appears euvolemic.  She received IV fluids please restart Lasix as needed.  She still appeared dry.  #3 type 2 diabetes with hyperglycemia- CBG (last 3)  Recent Labs    10/13/19 2223 10/14/19 0637 10/14/19 1322  GLUCAP 235* 168* 237*     #4 history of chronic COPD and oxygen prior to admission  #5 hypothyroidism on Synthroid  #6 history of essential hypertension continue Avapro   Estimated body mass index is 30.11 kg/m as calculated from the following:   Height as of this encounter: 5\' 3"  (1.6 m).   Weight as of this encounter: 77.1 kg.  DVT prophylaxis: Aspirin  code Status: Full code  family Communication: None at bedside  disposition Plan:  Status is: Inpatient  Dispo: The patient is from: Home              Anticipated d/c is to: snf              Anticipated d/c date  XN:TZGY than 1 days TOC consulted patient needs SNF              Patient currently is medically stable to be discharged    Consultants: orhto  Procedures: Right hip surgery 10/13/2019 Antimicrobials: None  Subjective: Patient resting in bed complaining of right lower extremity cramps  Objective: Vitals:   10/13/19 1812 10/13/19 2021 10/14/19 0520 10/14/19 1016  BP: (!) 171/55 (!) 162/50 (!) 121/41 (!) 133/39  Pulse: 73 79 72 69  Resp: 17 18 18 17   Temp: 97.8 F (36.6 C) 97.7 F (36.5 C) 98 F (36.7 C) 97.6 F (36.4 C)  TempSrc: Oral Oral Oral Oral  SpO2: 93% 91% 91% 93%  Weight:      Height:        Intake/Output Summary (Last 24 hours) at 10/14/2019 1417 Last data filed at 10/14/2019 0544 Gross per 24 hour  Intake 965 ml  Output 3400 ml  Net -2435 ml   Filed Weights   10/12/19 1718  Weight: 77.1 kg    Examination:  General exam: Appears calm and comfortable  Respiratory system: Clear to auscultation. Respiratory effort normal. Cardiovascular system: S1 & S2 heard, RRR. No JVD, murmurs, rubs, gallops or clicks. No pedal edema. Gastrointestinal system: Abdomen is nondistended, soft and nontender. No organomegaly or masses felt. Normal bowel sounds heard. Central nervous system: Alert and oriented. No focal neurological deficits. Extremities: Right hip incision covered with dressing clean dry intact no erythema surrounding noted Skin: No rashes,  lesions or ulcers Psychiatry: Judgement and insight appear normal. Mood & affect appropriate.     Data Reviewed: I have personally reviewed following labs and imaging studies  CBC: Recent Labs  Lab 10/12/19 1712 10/13/19 0959 10/14/19 0444  WBC 5.6 5.6 6.9  NEUTROABS 2.9 3.4 4.4  HGB 8.2* 8.1* 8.3*  HCT 30.3* 30.5* 30.2*  MCV 70.1* 71.6* 72.4*  PLT 159 128* 951*   Basic Metabolic Panel: Recent Labs  Lab 10/12/19 1712 10/13/19 0743 10/13/19 0959 10/14/19 0444  NA 135 135 135 135  K 4.9 4.7 4.9 4.8  CL 102  103 104 100  CO2 21* 24 23 27   GLUCOSE 181* 173* 175* 192*  BUN 30* 30* 30* 27*  CREATININE 1.72* 1.86* 1.83* 1.99*  CALCIUM 8.8* 8.1* 8.3* 8.4*  MG  --   --  1.7  --    GFR: Estimated Creatinine Clearance: 18.8 mL/min (A) (by C-G formula based on SCr of 1.99 mg/dL (H)). Liver Function Tests: Recent Labs  Lab 10/13/19 0959  AST 19  ALT 14  ALKPHOS 78  BILITOT 1.1  PROT 6.6  ALBUMIN 3.1*   No results for input(s): LIPASE, AMYLASE in the last 168 hours. No results for input(s): AMMONIA in the last 168 hours. Coagulation Profile: Recent Labs  Lab 10/12/19 1712  INR 1.1   Cardiac Enzymes: No results for input(s): CKTOTAL, CKMB, CKMBINDEX, TROPONINI in the last 168 hours. BNP (last 3 results) No results for input(s): PROBNP in the last 8760 hours. HbA1C: Recent Labs    10/12/19 1712  HGBA1C 11.2*   CBG: Recent Labs  Lab 10/13/19 1233 10/13/19 1638 10/13/19 2223 10/14/19 0637 10/14/19 1322  GLUCAP 127* 145* 235* 168* 237*   Lipid Profile: No results for input(s): CHOL, HDL, LDLCALC, TRIG, CHOLHDL, LDLDIRECT in the last 72 hours. Thyroid Function Tests: No results for input(s): TSH, T4TOTAL, FREET4, T3FREE, THYROIDAB in the last 72 hours. Anemia Panel: No results for input(s): VITAMINB12, FOLATE, FERRITIN, TIBC, IRON, RETICCTPCT in the last 72 hours. Sepsis Labs: No results for input(s): PROCALCITON, LATICACIDVEN in the last 168 hours.  Recent Results (from the past 240 hour(s))  SARS Coronavirus 2 by RT PCR (hospital order, performed in [facility] hospital lab) Nasopharyngeal Nasopharyngeal Swab     Status: None   Collection Time: 10/12/19  6:57 PM   Specimen: Nasopharyngeal Swab  Result Value Ref Range Status   SARS Coronavirus 2 NEGATIVE NEGATIVE Final    Comment: (NOTE) SARS-CoV-2 target nucleic acids are NOT DETECTED.  The SARS-CoV-2 RNA is generally detectable in upper and lower respiratory specimens during the acute phase of infection. The  lowest concentration of SARS-CoV-2 viral copies this assay can detect is 250 copies / mL. A negative result does not preclude SARS-CoV-2 infection and should not be used as the sole basis for treatment or other patient management decisions.  A negative result may occur with improper specimen collection / handling, submission of specimen other than nasopharyngeal swab, presence of viral mutation(s) within the areas targeted by this assay, and inadequate number of viral copies (<250 copies / mL). A negative result must be combined with clinical observations, patient history, and epidemiological information.  Fact Sheet for Patients:   StrictlyIdeas.no  Fact Sheet for Healthcare Providers: BankingDealers.co.za  This test is not yet approved or  cleared by the [place] FDA and has been authorized for detection and/or diagnosis of SARS-CoV-2 by FDA under an Emergency Use Authorization (EUA).  This EUA will remain in effect (  meaning this test can be used) for the duration of the COVID-19 declaration under Section 564(b)(1) of the Act, 21 U.S.C. section 360bbb-3(b)(1), unless the authorization is terminated or revoked sooner.  Performed at [facility], [address], [place], [place] [address]          Radiology Studies: Pelvis Portable  Result Date: 10/13/2019 CLINICAL DATA:  Postoperative evaluation. EXAM: PORTABLE PELVIS 1-2 VIEWS COMPARISON:  None. FINDINGS: A radiopaque intramedullary rod and compression screw device are seen within the proximal right femur. A well-aligned acute fracture is seen just below the inter trochanteric region. There is no evidence of dislocation. No pelvic bone lesions are seen. IMPRESSION: Status post open reduction internal fixation of the proximal right femur. Electronically Signed   By: Virgina Norfolk M.D.   On: 10/13/2019 18:00   DG Chest Port 1 View  Result Date: 10/12/2019 CLINICAL DATA:  Pt  states she fell back and landed on floor earlier today. Pt c/o right leg pain EXAM: PORTABLE CHEST 1 VIEW COMPARISON:  Chest radiograph 02/08/2019 FINDINGS: Stable cardiomediastinal contours with aortic stent graft in place. Enlarged heart size. There are scattered bibasilar opacities likely atelectasis or scarring. No pneumothorax or large pleural effusion. No acute finding in the visualized skeleton. IMPRESSION: Scattered bibasilar opacities likely reflecting atelectasis or scarring. Electronically Signed   By: Audie Pinto M.D.   On: 10/12/2019 17:38   DG C-Arm 1-60 Min  Result Date: 10/13/2019 CLINICAL DATA:  Intraoperative right femoral fracture fixation. EXAM: DG C-ARM 1-60 MIN CONTRAST:  N/A FLUOROSCOPY TIME:  Fluoroscopy Time:  33 seconds Radiation Exposure Index (if provided by the fluoroscopic device): 4.4 mGy Number of Acquired Spot Images: 2 COMPARISON:  None. FINDINGS: A radiopaque intramedullary rod and compression screw device are seen within the proximal right femur. There is alignment of the fracture deformity involving the proximal right femoral shaft. There is no evidence of dislocation. IMPRESSION: Status post ORIF of a proximal right femoral shaft fracture. Electronically Signed   By: Virgina Norfolk M.D.   On: 10/13/2019 17:59   DG HIP OPERATIVE UNILAT W OR W/O PELVIS RIGHT  Result Date: 10/13/2019 CLINICAL DATA:  Intraoperative evaluation of proximal right femoral fracture fixation. EXAM: OPERATIVE RIGHT HIP (WITH PELVIS IF PERFORMED) 2 VIEWS TECHNIQUE: Fluoroscopic spot image(s) were submitted for interpretation post-operatively. COMPARISON:  None. FINDINGS: A radiopaque intramedullary rod and compression screw device are seen within the proximal right femur. The acute fracture of the proximal right femur is well aligned. Noted evidence of dislocation is seen. Soft tissue structures are unremarkable. IMPRESSION: Status post ORIF of the proximal right femur. Electronically  Signed   By: Virgina Norfolk M.D.   On: 10/13/2019 17:55   DG Hip Unilat With Pelvis 2-3 Views Right  Result Date: 10/12/2019 CLINICAL DATA:  [place] backwards landing on floor earlier today, RIGHT hip pain EXAM: DG HIP (WITH OR WITHOUT PELVIS) 2-3V RIGHT COMPARISON:  None FINDINGS: Osseous demineralization. SI joints poorly visualized. Mild narrowing of the hip joints bilaterally. Mildly displaced lesser trochanteric fracture. Lucency in the intertrochanteric region of the proximal RIGHT femur, suspicious for nondisplaced intertrochanteric fracture. A portion of the fracture plane is evident on the shoot through lateral view but the superolateral portion of the fracture line is not clearly delineated. No dislocation or pelvic fracture seen. IMPRESSION: Suspected nondisplaced intertrochanteric fracture of the RIGHT femur with a mildly displaced lesser trochanteric fracture fragment. The superolateral extent of the fracture is not well demonstrated; this could be better delineated by  CT. Electronically Signed   By: Lavonia Dana M.D.   On: 10/12/2019 17:42   DG Femur Min 2 Views Right  Result Date: 10/12/2019 CLINICAL DATA:  Pt states she fell back and landed on floor earlier today. Pt c/o right leg pain EXAM: RIGHT FEMUR 2 VIEWS COMPARISON:  Right hip radiographs 10/12/2019 FINDINGS: There is again a mildly displaced lesser trochanteric fracture, similar in appearance to prior. Subtle lucency in the inter trochanteric region suspicious for nondisplaced fracture. No evidence of dislocation. No new acute finding in the right femur. Status post right knee arthroplasty. Alignment appears intact. There are vascular calcifications in the regional soft tissues. IMPRESSION: No significant changes in the right hip from earlier prior radiographs demonstrating a mildly displaced lesser trochanteric fracture and lucency in the inter trochanteric region suspicious for nondisplaced fracture. Electronically Signed   By: Audie Pinto M.D.   On: 10/12/2019 20:13        Scheduled Meds: . acetaminophen  1,000 mg Oral TID  . ALPRAZolam  1 mg Oral QHS  . aspirin EC  325 mg Oral Q breakfast  . atorvastatin  40 mg Oral Daily  . Chlorhexidine Gluconate Cloth  6 each Topical Daily  . cholecalciferol  2,000 Units Oral Daily  . docusate sodium  100 mg Oral BID  . furosemide  40 mg Oral Daily  . gabapentin  300-600 mg Oral QHS  . insulin aspart  0-9 Units Subcutaneous TID WC  . irbesartan  300 mg Oral Daily  . levothyroxine  112 mcg Oral QAC breakfast  . loratadine  10 mg Oral Daily  . neomycin-polymyxin b-dexamethasone  2 drop Both Eyes Q6H  . ofloxacin  1 drop Both Eyes See admin instructions  . pantoprazole  40 mg Oral Daily  . senna  1 tablet Oral BID  . vitamin B-12  1,000 mcg Oral Daily   Continuous Infusions: . sodium chloride 50 mL/hr at 10/13/19 1023  . methocarbamol (ROBAXIN) IV       LOS: 2 days    Georgette Shell, MD  10/14/2019, 2:17 PM

## 2019-10-14 NOTE — Progress Notes (Signed)
    Subjective:  Patient reports pain as mild to moderate.  Denies N/V/CP/SOB. Patient is complaining of muscle spasms in her right thigh this AM  Objective:   VITALS:   Vitals:   10/13/19 1735 10/13/19 1812 10/13/19 2021 10/14/19 0520  BP: (!) 156/55 (!) 171/55 (!) 162/50 (!) 121/41  Pulse: 64 73 79 72  Resp: (!) 131 17 18 18   Temp: 97.6 F (36.4 C) 97.8 F (36.6 C) 97.7 F (36.5 C) 98 F (36.7 C)  TempSrc:  Oral Oral Oral  SpO2: 98% 93% 91% 91%  Weight:      Height:        NAD ABD soft Neurovascular intact Sensation intact distally Intact pulses distally Dorsiflexion/Plantar flexion intact Incision: dressing C/D/I   Lab Results  Component Value Date   WBC 6.9 10/14/2019   HGB 8.3 (L) 10/14/2019   HCT 30.2 (L) 10/14/2019   MCV 72.4 (L) 10/14/2019   PLT 136 (L) 10/14/2019   BMET    Component Value Date/Time   NA 135 10/14/2019 0444   K 4.8 10/14/2019 0444   CL 100 10/14/2019 0444   CO2 27 10/14/2019 0444   GLUCOSE 192 (H) 10/14/2019 0444   BUN 27 (H) 10/14/2019 0444   CREATININE 1.99 (H) 10/14/2019 0444   CALCIUM 8.4 (L) 10/14/2019 0444   GFRNONAA 22 (L) 10/14/2019 0444   GFRAA 25 (L) 10/14/2019 0444     Assessment/Plan: 1 Day Post-Op   Active Problems:   Hypothyroidism   HTN (hypertension)   Chronic respiratory failure with hypoxia (HCC)   DM (diabetes mellitus) type II controlled with renal manifestation (HCC)   Intertrochanteric fx-closed, right, initial encounter (HCC)   Chronic diastolic CHF (congestive heart failure) ([place])   WBAT with walker DVT ppx: Aspirin, SCDs, TEDS PO pain control PT/OT Dispo: D/C planning   Rx in chart   Dorothyann Peng 10/14/2019, 8:02 AM [facility] is now [address], [place], [place] [address] Phone: [phone] [email or web address]  [employer]

## 2019-10-14 NOTE — Evaluation (Signed)
Physical Therapy Evaluation Patient Details Name: Catherine Rich MRN: [id number] DOB: Jan 08, 1929 Today's Date: 10/14/2019   History of Present Illness  Pt is an 86 yo female who presents with R hip fx after falling at home. She underwent IM nailing on 8/30.   PMH: hypothyroid, HTN, chronic respiratory failure, DM2, CHF, CKD3, GERD, macular degeneration, CVA, R TKA.   Clinical Impression  Pt admitted with above diagnosis. Pt mildly lethargic on eval. Needed mod A for bed mobility and mod A +2 for sit to stand and SPT to recliner. Pt had difficulty moving RLE in standing but also LLE due to aversion to wt shifting R. Anticipate that she will need SNF before d/c home, she is agreeable and would like to go to SNF where she has been before.  Pt currently with functional limitations due to the deficits listed below (see PT Problem List). Pt will benefit from skilled PT to increase their independence and safety with mobility to allow discharge to the venue listed below.       Follow Up Recommendations SNF;Supervision/Assistance - 24 hour    Equipment Recommendations  Rolling walker with 5" wheels    Recommendations for Other Services OT consult     Precautions / Restrictions Precautions Precautions: Fall Restrictions Weight Bearing Restrictions: Yes RLE Weight Bearing: Weight bearing as tolerated      Mobility  Bed Mobility Overal bed mobility: Needs Assistance Bed Mobility: Supine to Sit     Supine to sit: Mod assist;HOB elevated     General bed mobility comments: mod A for LE's and hips to EOB, pt able to manage trunk with use of bedrail  Transfers Overall transfer level: Needs assistance Equipment used: Rolling walker (2 wheeled) Transfers: Sit to/from [facility] Sit to Stand: Mod assist;+2 physical assistance Stand pivot transfers: Mod assist;+2 physical assistance       General transfer comment: pt's daughter stood on pt's L side with transfer. Mod A  for power up, pt avoidant of WB'ing on R side so had difficulty stepping L foot. Pivoted feet to chair only  Ambulation/Gait             General Gait Details: unable due to pain  Stairs            Wheelchair Mobility    Modified Rankin (Stroke Patients Only)       Balance Overall balance assessment: Needs assistance Sitting-balance support: Single extremity supported Sitting balance-Leahy Scale: Good     Standing balance support: Bilateral upper extremity supported Standing balance-Leahy Scale: Poor Standing balance comment: heavily reliant on UE and external support                             Pertinent Vitals/Pain Pain Assessment: 0-10 Pain Score: 10-Worst pain ever Pain Location: R thigh Pain Descriptors / Indicators: Cramping Pain Intervention(s): Limited activity within patient's tolerance;Monitored during session    Home Living Family/patient expects to be discharged to:: Private residence Living Arrangements: Children Available Help at Discharge: Family;Available 24 hours/day Type of Home: Mobile home Home Access: Stairs to enter Entrance Stairs-Rails: Left Entrance Stairs-Number of Steps: 4 Home Layout: One level Home Equipment: Walker - 4 wheels;Wheelchair - manual;Cane - single point;Other (comment) (lift chair) Additional Comments: pt's daughter is retired and lives with her    Prior Function Level of Independence: Needs assistance   Gait / Transfers Assistance Needed: supervision level due to balance  ADL's / [employer]  Needed: daughter cooks mostly, bathes and dressed herself        Hand Dominance   Dominant Hand: Right    Extremity/Trunk Assessment   Upper Extremity Assessment Upper Extremity Assessment: Defer to OT evaluation    Lower Extremity Assessment Lower Extremity Assessment: RLE deficits/detail RLE Deficits / Details: hip flex 1/5, knee ext 3-/5 RLE: Unable to fully assess due to pain RLE  Sensation: decreased proprioception RLE Coordination: decreased gross motor    Cervical / Trunk Assessment Cervical / Trunk Assessment: Kyphotic  Communication   Communication: No difficulties  Cognition Arousal/Alertness: Lethargic;Suspect due to medications Behavior During Therapy: [facility] for tasks assessed/performed Overall Cognitive Status: Within Functional Limits for tasks assessed                                 General Comments: age appropriate though mildly lethargic      General Comments General comments (skin integrity, edema, etc.): SPO2 92% on 2L (pt wears O2 at night only at home). HR 85 bpm    Exercises General Exercises - Lower Extremity Ankle Circles/Pumps: AROM;Both;10 reps;Seated Quad Sets: AROM;Both;10 reps;Seated   Assessment/Plan    PT Assessment Patient needs continued PT services  PT Problem List Decreased strength;Decreased activity tolerance;Decreased balance;Decreased mobility;Decreased coordination;Decreased knowledge of use of DME;Decreased safety awareness;Decreased knowledge of precautions;Pain       PT Treatment Interventions DME instruction;Gait training;Functional mobility training;Therapeutic activities;Therapeutic exercise;Balance training;Neuromuscular re-education;Patient/family education;Cognitive remediation    PT Goals (Current goals can be found in the Care Plan section)  Acute Rehab PT Goals Patient Stated Goal: return home PT Goal Formulation: With patient/family Time For Goal Achievement: 10/28/19 Potential to Achieve Goals: Good    Frequency Min 3X/week   Barriers to discharge        Co-evaluation               AM-PAC PT "6 Clicks" Mobility  Outcome Measure Help needed turning from your back to your side while in a flat bed without using bedrails?: A Lot Help needed moving from lying on your back to sitting on the side of a flat bed without using bedrails?: A Lot Help needed moving to and from a bed to  a chair (including a wheelchair)?: A Lot Help needed standing up from a chair using your arms (e.g., wheelchair or bedside chair)?: A Lot Help needed to walk in hospital room?: Total Help needed climbing 3-5 steps with a railing? : Total 6 Click Score: 10    End of Session Equipment Utilized During Treatment: Gait belt Activity Tolerance: Patient limited by pain Patient left: in chair;with call bell/phone within reach;with chair alarm set;with family/visitor present Nurse Communication: Mobility status PT Visit Diagnosis: Unsteadiness on feet (R26.81);Muscle weakness (generalized) (M62.81);Pain;Difficulty in walking, not elsewhere classified (R26.2) Pain - Right/Left: Right Pain - part of body: Hip    Time: 1884-1660 PT Time Calculation (min) (ACUTE ONLY): 37 min   Charges:   PT Evaluation $PT Eval Moderate Complexity: 1 Mod PT Treatments $Therapeutic Activity: 8-22 mins        Leighton Roach, PT  Acute Rehab Services  Pager [phone] Office [place] 10/14/2019, 12:57 PM

## 2019-10-14 NOTE — Plan of Care (Signed)

## 2019-10-15 LAB — CBC
HCT: 30.3 % — ABNORMAL LOW (ref 36.0–46.0)
Hemoglobin: 8.8 g/dL — ABNORMAL LOW (ref 12.0–15.0)
MCH: 21.2 pg — ABNORMAL LOW (ref 26.0–34.0)
MCHC: 29 g/dL — ABNORMAL LOW (ref 30.0–36.0)
MCV: 72.8 fL — ABNORMAL LOW (ref 80.0–100.0)
Platelets: 141 10*3/uL — ABNORMAL LOW (ref 150–400)
RBC: 4.16 MIL/uL (ref 3.87–5.11)
RDW: 22.4 % — ABNORMAL HIGH (ref 11.5–15.5)
WBC: 7.2 10*3/uL (ref 4.0–10.5)
nRBC: 0 % (ref 0.0–0.2)

## 2019-10-15 LAB — BASIC METABOLIC PANEL
Anion gap: 13 (ref 5–15)
BUN: 31 mg/dL — ABNORMAL HIGH (ref 8–23)
CO2: 24 mmol/L (ref 22–32)
Calcium: 8.8 mg/dL — ABNORMAL LOW (ref 8.9–10.3)
Chloride: 99 mmol/L (ref 98–111)
Creatinine, Ser: 2.09 mg/dL — ABNORMAL HIGH (ref 0.44–1.00)
GFR calc Af Amer: 24 mL/min — ABNORMAL LOW (ref >60)
GFR calc non Af Amer: 20 mL/min — ABNORMAL LOW (ref >60)
Glucose, Bld: 209 mg/dL — ABNORMAL HIGH (ref 70–99)
Potassium: 5.3 mmol/L — ABNORMAL HIGH (ref 3.5–5.1)
Sodium: 136 mmol/L (ref 135–145)

## 2019-10-15 LAB — GLUCOSE, CAPILLARY
Glucose-Capillary: 216 mg/dL — ABNORMAL HIGH (ref 70–99)
Glucose-Capillary: 168 mg/dL — ABNORMAL HIGH (ref 70–99)
Glucose-Capillary: 96 mg/dL (ref 70–99)
Glucose-Capillary: 193 mg/dL — ABNORMAL HIGH (ref 70–99)

## 2019-10-15 LAB — MAGNESIUM: Magnesium: 2.3 mg/dL (ref 1.7–2.4)

## 2019-10-15 MED ORDER — OXYCODONE HCL 5 MG PO TABS
5.0000 mg | ORAL_TABLET | ORAL | Status: DC | PRN
  Administered 2019-10-15 – 2019-10-17 (×3): 5 mg via ORAL
  Filled 2019-10-15 (×3): qty 1

## 2019-10-15 MED ORDER — HYDROMORPHONE HCL 1 MG/ML IJ SOLN: 1.0000 mg | INTRAMUSCULAR | Status: DC | PRN

## 2019-10-15 MED ORDER — IBUPROFEN 200 MG PO TABS: 200.0000 mg | ORAL_TABLET | Freq: Three times a day (TID) | ORAL | Status: DC

## 2019-10-15 MED ORDER — GABAPENTIN 300 MG PO CAPS
300.0000 mg | ORAL_CAPSULE | Freq: Every day | ORAL | Status: DC
  Administered 2019-10-15 – 2019-10-16 (×2): 300 mg via ORAL
  Filled 2019-10-15 (×2): qty 1

## 2019-10-15 MED ORDER — GABAPENTIN 300 MG PO CAPS: 300.0000 mg | ORAL_CAPSULE | Freq: Every evening | ORAL | Status: DC | PRN

## 2019-10-15 NOTE — Evaluation (Signed)
Occupational Therapy Evaluation Patient Details Name: Catherine Rich MRN: [id number] DOB: 1929/01/09 Today's Date: 10/15/2019    History of Present Illness Pt is an 86 yo female who presents with R hip fx after falling at home. She underwent IM nailing on 8/30.   PMH: hypothyroid, HTN, chronic respiratory failure, DM2, CHF, CKD3, GERD, macular degeneration, CVA, R TKA.    Clinical Impression   PTA pt living with daughter, able to complete BADL independently with as needed assist. At time of eval, pt presents with ability to complete bed mobility and sit <> stands at mod A with RW. Pt is currently requires max A for LB BADLs this date and mod A for toilet transfer. Noted cognitive deficits in memory and problem solving, suspect this to be baseline. Given current status, recommend SNF to support safety, BADL engagement, and PLOF. OT will continue to follow per POC listed below.    Follow Up Recommendations  SNF    Equipment Recommendations  None recommended by OT    Recommendations for Other Services       Precautions / Restrictions Precautions Precautions: Fall Precaution Comments: bruised/painful toes on L foot Restrictions Weight Bearing Restrictions: Yes RLE Weight Bearing: Weight bearing as tolerated      Mobility Bed Mobility Overal bed mobility: Needs Assistance Bed Mobility: Sit to Supine       Sit to supine: Mod assist   General bed mobility comments: assist for BLEs back onto bed R>L  Transfers Overall transfer level: Needs assistance Equipment used: Rolling walker (2 wheeled) Transfers: Sit to/from [facility] Sit to Stand: Mod assist Stand pivot transfers: Mod assist       General transfer comment: assist to rise and steady from chair, cues for sequencing RW usage    Balance Overall balance assessment: Needs assistance Sitting-balance support: Single extremity supported Sitting balance-Leahy Scale: Good     Standing balance  support: Bilateral upper extremity supported Standing balance-Leahy Scale: Poor Standing balance comment: heavily reliant on UE and external support                           ADL either performed or assessed with clinical judgement   ADL Overall ADL's : Needs assistance/impaired Eating/Feeding: Set up;Sitting   Grooming: Set up;Sitting   Upper Body Bathing: Minimal assistance;Sitting   Lower Body Bathing: Maximal assistance;Sitting/lateral leans;Sit to/from stand   Upper Body Dressing : Minimal assistance;Sitting   Lower Body Dressing: Maximal assistance;Sitting/lateral leans;Sit to/from stand Lower Body Dressing Details (indicate cue type and reason): is familiar with use of reacher to don/doff pants Toilet Transfer: Moderate assistance;Stand-pivot;BSC;RW   Toileting- Clothing Manipulation and Hygiene: Minimal assistance;Sit to/from stand;Sitting/lateral lean       Functional mobility during ADLs: Moderate assistance;Rolling walker;Cueing for safety (pivot transfers only)       Vision Baseline Vision/History: Wears glasses Wears Glasses: At all times Patient Visual Report: No change from baseline       Perception     Praxis      Pertinent Vitals/Pain Pain Assessment: Faces Pain Location: R thigh spasm Pain Descriptors / Indicators: Spasm;Sore Pain Intervention(s): Monitored during session;Limited activity within patient's tolerance     Hand Dominance Right   Extremity/Trunk Assessment Upper Extremity Assessment Upper Extremity Assessment: Generalized weakness   Lower Extremity Assessment Lower Extremity Assessment: Defer to PT evaluation       Communication     Cognition Arousal/Alertness: Awake/alert Behavior During Therapy: [facility] for tasks assessed/performed  Overall Cognitive Status: History of cognitive impairments - at baseline Area of Impairment: Memory;Problem solving                     Memory: Decreased short-term  memory;Decreased recall of precautions       Problem Solving: Slow processing;Requires verbal cues;Requires tactile cues General Comments: daughter present and reporting cognition is baseline. Requires increased time and cues for basic BADL tasks and for memory   General Comments       Exercises     Shoulder Instructions      Home Living Family/patient expects to be discharged to:: Private residence Living Arrangements: Children Available Help at Discharge: Family;Available 24 hours/day   Home Access: Stairs to enter [employer] of Steps: 4 Entrance Stairs-Rails: Left Home Layout: One level     Bathroom Shower/Tub: Other (comment) (walk in tub)   Bathroom Toilet: Handicapped height     Home Equipment: [place] - 4 wheels;Wheelchair - manual;Cane - single point;Other (comment) (lift chair)   Additional Comments: pt's daughter is retired and lives with her      Prior Functioning/Environment Level of Independence: Needs assistance  Gait / Transfers Assistance Needed: supervision level due to balance ADL's / Homemaking Assistance Needed: daughter cooks mostly, bathes and dressed herself            OT Problem List: Decreased strength;Decreased knowledge of use of DME or AE;Decreased knowledge of precautions;Decreased activity tolerance;Impaired balance (sitting and/or standing);Pain;Decreased cognition;Decreased safety awareness      OT Treatment/Interventions: Self-care/ADL training;Therapeutic exercise;Patient/family education;Balance training;Energy conservation;Therapeutic activities;DME and/or AE instruction    OT Goals(Current goals can be found in the care plan section) Acute Rehab OT Goals Patient Stated Goal: return to independence OT Goal Formulation: With patient Time For Goal Achievement: 10/29/19 Potential to Achieve Goals: Good  OT Frequency: Min 2X/week   Barriers to D/C:            Co-evaluation              AM-PAC OT "6  Clicks" Daily Activity     Outcome Measure Help from another person eating meals?: A Little Help from another person taking care of personal grooming?: A Little Help from another person toileting, which includes using toliet, bedpan, or urinal?: A Lot Help from another person bathing (including washing, rinsing, drying)?: A Lot Help from another person to put on and taking off regular upper body clothing?: A Little Help from another person to put on and taking off regular lower body clothing?: A Lot 6 Click Score: 15   End of Session Equipment Utilized During Treatment: Gait belt;Rolling walker Nurse Communication: Mobility status  Activity Tolerance: Patient tolerated treatment well Patient left: in bed;with call bell/phone within reach;with family/visitor present  OT Visit Diagnosis: Unsteadiness on feet (R26.81);Other abnormalities of gait and mobility (R26.89);History of falling (Z91.81);Pain Pain - Right/Left: Right Pain - part of body: Leg;Hip                Time: 1212-1231 OT Time Calculation (min): 19 min Charges:  OT General Charges $OT Visit: 1 Visit OT Evaluation $OT Eval Moderate Complexity: [place], MSOT, OTR/L Acute Rehabilitation Services [facility] Office Number: [phone] Pager: [phone]  Zenovia Jarred 10/15/2019, 2:29 PM

## 2019-10-15 NOTE — Progress Notes (Signed)
PROGRESS NOTE    Catherine Rich  GNF:[id number] DOB: [birth year] DOA: 10/12/2019 PCP: Caryl Bis, MD    Brief Narrative:  Patient admitted to the hospital with the working diagnosis of nondisplaced right intratrochanteric fracture.  86 year old female with past medical history for chronic kidney disease stage III, hypertension, GERD, type 2 diabetes mellitus, dyslipidemia and COPD who presented after a mechanical fall.  After patient falling down on the floor she felt immediate onset of pain at the right hip, with inability to bear weight.  On her initial physical examination her temperature was 98.4, blood pressure 204/57, heart rate 72 and respiratory 12, her lungs are clear to auscultation bilaterally, heart S1-S2, present rhythmic, abdomen was soft, she had no lower extremity edema. Right hip x-rays with nondisplaced intertrochanteric fracture of the right femur with a mildly displaced lesser trochanteric fracture fragment.  Patient was admitted to the medical ward, received DVT prophylaxis and analgesics.  She underwent intramedullary fixation of the right femur per orthopedics on August 30.  She received intraoperative 1 unit packed red blood cells.  Assessment & Plan:   Principal Problem:   Closed nondisplaced intertrochanteric fracture of right femur (HCC) Active Problems:   Hypothyroidism   HTN (hypertension)   Chronic respiratory failure with hypoxia (HCC)   DM (diabetes mellitus) type II controlled with renal manifestation (HCC)   Chronic diastolic CHF (congestive heart failure) ([place])   Elective surgery   1. Right hip fracture. Patient continue with significant pain, worse with movement and touch. No nausea or vomiting. Her daughter is at bedside.   Continue pain control with scheduled acetaminophen 1000 mg tid, as needed flexeril, hydromorphone and methocarbamol.  Will add as needed oxycodone and scheduled tid ibuprofen. Continue with physical and occupational  therapy.   Follow with surgical recommendations. On full dose aspirin for DVT prophylaxis.   2. HTN. Holding blood pressure medications for now to prevent hypotension.   3. Hypothyroid. Continue with levothyroxine   4. T2DM/ dyslipidemia. Glucose cover and monitoring with insulin sliding scale. Patient is tolerating po well.   Continue statin therapy with atorvastatin.   5, Chronic diastolic heart failure. Stable with no signs of exacerbation. Will hold on furosemide for now. Continue blood pressure monitoring.   6. Anxiety. Continue alprazolam at night.   7. CKD stage 4/ hyperkalemia. Stable renal function with base cr at 1,8 to 2,0. K is 5,3 and serum bicarbonate at 24.  Patient not hypervolemic, will continue to hold on furosemide and ace inh for now.  Will continue close follow up on renal function and electrolytes. Avoid hypotension and nephrotoxic medications.   8. Anemia of chronic renal disease.  hgb continue stable at 8,8 with Hct 30,3. Close follow up on cell count.    Status is: Inpatient  Remains inpatient appropriate because:IV treatments appropriate due to intensity of illness or inability to take PO and Inpatient level of care appropriate due to severity of illness   Dispo: The patient is from: Home              Anticipated d/c is to: SNF              Anticipated d/c date is: 2 days              Patient currently is not medically stable to d/c.   DVT prophylaxis: Aspirin   Code Status:   dnr   Family Communication:  I spoke with patient's daughter at the bedside, we talked in  detail about patient's condition, plan of care and prognosis and all questions were addressed.      Consultants:   Orthopedics   Procedures:   Right hip  intramedullary fixation of femur      Subjective: Patient continue to have significant pain at the surgical site, worse with movement, no nausea or vomiting, no dyspnea or chest pain   Objective: Vitals:   10/14/19 1430  10/14/19 2040 10/15/19 0336 10/15/19 0744  BP: (!) 141/50 (!) 150/43 (!) 137/41 (!) 163/50  Pulse: 76 71 72 83  Resp:  17 17 18   Temp: 98.5 F (36.9 C) 98.4 F (36.9 C) 98.2 F (36.8 C) 98.3 F (36.8 C)  TempSrc: Axillary Oral Oral Oral  SpO2:  90% 90% 91%  Weight:      Height:       No intake or output data in the 24 hours ending 10/15/19 1342 Filed Weights   10/12/19 1718  Weight: 77.1 kg    Examination:   General: positive pain, no dyspnea  Neurology: Awake and alert, non focal  E ENT: no pallor, no icterus, oral mucosa moist Cardiovascular: No JVD. S1-S2 present, rhythmic, no gallops, rubs, or murmurs. No lower extremity edema. Pulmonary: positive breath sounds bilaterally, adequate air movement, no wheezing, rhonchi or rales. Gastrointestinal. Abdomen soft and non tender Skin. No rashes Musculoskeletal: no joint deformities     Data Reviewed: I have personally reviewed following labs and imaging studies  CBC: Recent Labs  Lab 10/12/19 1712 10/13/19 0959 10/14/19 0444 10/15/19 0551  WBC 5.6 5.6 6.9 7.2  NEUTROABS 2.9 3.4 4.4  --   HGB 8.2* 8.1* 8.3* 8.8*  HCT 30.3* 30.5* 30.2* 30.3*  MCV 70.1* 71.6* 72.4* 72.8*  PLT 159 128* 136* 751*   Basic Metabolic Panel: Recent Labs  Lab 10/12/19 1712 10/13/19 0743 10/13/19 0959 10/14/19 0444 10/15/19 0259  NA 135 135 135 135 136  K 4.9 4.7 4.9 4.8 5.3*  CL 102 103 104 100 99  CO2 21* 24 23 27 24   GLUCOSE 181* 173* 175* 192* 209*  BUN 30* 30* 30* 27* 31*  CREATININE 1.72* 1.86* 1.83* 1.99* 2.09*  CALCIUM 8.8* 8.1* 8.3* 8.4* 8.8*  MG  --   --  1.7  --  2.3   GFR: Estimated Creatinine Clearance: 17.9 mL/min (A) (by C-G formula based on SCr of 2.09 mg/dL (H)). Liver Function Tests: Recent Labs  Lab 10/13/19 0959  AST 19  ALT 14  ALKPHOS 78  BILITOT 1.1  PROT 6.6  ALBUMIN 3.1*   No results for input(s): LIPASE, AMYLASE in the last 168 hours. No results for input(s): AMMONIA in the last 168  hours. Coagulation Profile: Recent Labs  Lab 10/12/19 1712  INR 1.1   Cardiac Enzymes: No results for input(s): CKTOTAL, CKMB, CKMBINDEX, TROPONINI in the last 168 hours. BNP (last 3 results) No results for input(s): PROBNP in the last 8760 hours. HbA1C: Recent Labs    10/12/19 1712  HGBA1C 11.2*   CBG: Recent Labs  Lab 10/14/19 1322 10/14/19 1810 10/14/19 2039 10/15/19 0632 10/15/19 1129  GLUCAP 237* 143* 175* 216* 168*   Lipid Profile: No results for input(s): CHOL, HDL, LDLCALC, TRIG, CHOLHDL, LDLDIRECT in the last 72 hours. Thyroid Function Tests: No results for input(s): TSH, T4TOTAL, FREET4, T3FREE, THYROIDAB in the last 72 hours. Anemia Panel: No results for input(s): VITAMINB12, FOLATE, FERRITIN, TIBC, IRON, RETICCTPCT in the last 72 hours.    Radiology Studies: I have reviewed all of  the imaging during this hospital visit personally     Scheduled Meds: . acetaminophen  1,000 mg Oral TID  . ALPRAZolam  1 mg Oral QHS  . aspirin EC  325 mg Oral Q breakfast  . atorvastatin  40 mg Oral Daily  . Chlorhexidine Gluconate Cloth  6 each Topical Daily  . cholecalciferol  2,000 Units Oral Daily  . docusate sodium  100 mg Oral BID  . furosemide  40 mg Oral Daily  . gabapentin  300-600 mg Oral QHS  . insulin aspart  0-9 Units Subcutaneous TID WC  . irbesartan  300 mg Oral Daily  . levothyroxine  112 mcg Oral QAC breakfast  . loratadine  10 mg Oral Daily  . neomycin-polymyxin b-dexamethasone  2 drop Both Eyes Q6H  . ofloxacin  1 drop Both Eyes See admin instructions  . pantoprazole  40 mg Oral Daily  . senna  1 tablet Oral BID  . vitamin B-12  1,000 mcg Oral Daily   Continuous Infusions:   LOS: 3 days        Thana Ramp Gerome Apley, MD

## 2019-10-15 NOTE — Progress Notes (Signed)
Physical Therapy Treatment Patient Details Name: Catherine Rich MRN: [id number] DOB: Nov 03, 1928 Today's Date: 10/15/2019    History of Present Illness Pt is an 86 yo female who presents with R hip fx after falling at home. She underwent IM nailing on 8/30.   PMH: hypothyroid, HTN, chronic respiratory failure, DM2, CHF, CKD3, GERD, macular degeneration, CVA, R TKA.     PT Comments    Pt supine in bed on arrival this session.  Pt very nervous to move but she did progress well this session.  Pt able to take steps from bed to recliner.  Continue to recommend post acute rehab to maximize functional gains before returning home.     Follow Up Recommendations  SNF;Supervision/Assistance - 24 hour     Equipment Recommendations  Rolling walker with 5" wheels    Recommendations for Other Services OT consult     Precautions / Restrictions Precautions Precautions: Fall Precaution Comments: bruised/painful toes on L foot Restrictions Weight Bearing Restrictions: Yes RLE Weight Bearing: Weight bearing as tolerated    Mobility  Bed Mobility Overal bed mobility: Needs Assistance Bed Mobility: Supine to Sit     Supine to sit: Mod assist;HOB elevated Sit to supine: Mod assist   General bed mobility comments: Assist to elevate trunk into sitting and advance LEs to edge of bed.  Transfers Overall transfer level: Needs assistance Equipment used: Rolling walker (2 wheeled) Transfers: Sit to/from Stand Sit to Stand: Mod assist Stand pivot transfers: Mod assist       General transfer comment: Cues for hand placement and assistance to rise into standing.  Ambulation/Gait Ambulation/Gait assistance: Mod assist Gait Distance (Feet): 6 Feet Assistive device: Rolling walker (2 wheeled) Gait Pattern/deviations: Step-to pattern;Trunk flexed;Antalgic;Decreased stride length     General Gait Details: Pt painful on R side due to hip and L side due to her toes.  Heavy reliance on B UEs in  standing.  Pt required cues for sequencing and assistance to maintain close proximity to RW.  Able to step away from bed and back to recliner.  These were her first steps post operatively.   Stairs             Wheelchair Mobility    Modified Rankin (Stroke Patients Only)       Balance Overall balance assessment: Needs assistance Sitting-balance support: Single extremity supported Sitting balance-Leahy Scale: Good     Standing balance support: Bilateral upper extremity supported Standing balance-Leahy Scale: Poor Standing balance comment: heavily reliant on UE and external support                            Cognition Arousal/Alertness: Awake/alert Behavior During Therapy: WFL for tasks assessed/performed Overall Cognitive Status: History of cognitive impairments - at baseline Area of Impairment: Memory;Problem solving                     Memory: Decreased short-term memory;Decreased recall of precautions       Problem Solving: Slow processing;Requires verbal cues;Requires tactile cues General Comments: daughter present and reporting cognition is baseline. Requires increased time and cues for basic BADL tasks and for memory      Exercises General Exercises - Lower Extremity Ankle Circles/Pumps: AROM;Both;10 reps;Seated;Supine Quad Sets: AROM;Right;10 reps;Supine Heel Slides: AAROM;Right;10 reps;Supine Hip ABduction/ADduction: AAROM;Right;10 reps;Supine    General Comments        Pertinent Vitals/Pain Pain Assessment: Faces Pain Score: 7  Pain Location: R thigh  spasm and L 2nd and 3rd toes Pain Descriptors / Indicators: Spasm;Sore Pain Intervention(s): Monitored during session;Repositioned    Home Living Family/patient expects to be discharged to:: Private residence Living Arrangements: Children Available Help at Discharge: Family;Available 24 hours/day   Home Access: Stairs to enter Entrance Stairs-Rails: Left Home Layout: One  level Home Equipment: Walker - 4 wheels;Wheelchair - manual;Cane - single point;Other (comment) (lift chair) Additional Comments: pt's daughter is retired and lives with her    Prior Function Level of Independence: Needs assistance  Gait / Transfers Assistance Needed: supervision level due to balance ADL's / Homemaking Assistance Needed: daughter cooks mostly, bathes and dressed herself     PT Goals (current goals can now be found in the care plan section) Acute Rehab PT Goals Patient Stated Goal: return to independence Potential to Achieve Goals: Good Progress towards PT goals: Progressing toward goals    Frequency    Min 3X/week      PT Plan Current plan remains appropriate    Co-evaluation              AM-PAC PT "6 Clicks" Mobility   Outcome Measure  Help needed turning from your back to your side while in a flat bed without using bedrails?: A Lot Help needed moving from lying on your back to sitting on the side of a flat bed without using bedrails?: A Lot Help needed moving to and from a bed to a chair (including a wheelchair)?: A Lot Help needed standing up from a chair using your arms (e.g., wheelchair or bedside chair)?: A Lot Help needed to walk in hospital room?: Total Help needed climbing 3-5 steps with a railing? : Total 6 Click Score: 10    End of Session Equipment Utilized During Treatment: Gait belt Activity Tolerance: Patient limited by pain Patient left: in chair;with call bell/phone within reach;with chair alarm set;with family/visitor present Nurse Communication: Mobility status PT Visit Diagnosis: Unsteadiness on feet (R26.81);Muscle weakness (generalized) (M62.81);Pain;Difficulty in walking, not elsewhere classified (R26.2) Pain - Right/Left: Right Pain - part of body: Hip     Time: 1040-1105 PT Time Calculation (min) (ACUTE ONLY): 25 min  Charges:  $Therapeutic Exercise: 8-22 mins $Therapeutic Activity: 8-22 mins                      Erasmo Leventhal , PTA Acute Rehabilitation Services Pager [phone] Office [phone]     Marjani Kobel Eli Hose 10/15/2019, 2:37 PM

## 2019-10-15 NOTE — Progress Notes (Signed)
Inpatient Diabetes Program Recommendations  AACE/ADA: New Consensus Statement on Inpatient Glycemic Control (2015)  Target Ranges:  Prepandial:   less than 140 mg/dL      Peak postprandial:   less than 180 mg/dL (1-2 hours)      Critically ill patients:  140 - 180 mg/dL   Lab Results  Component Value Date   GLUCAP 216 (H) 10/15/2019   HGBA1C 11.2 (H) 10/12/2019    Review of Glycemic Control  Diabetes history: DM 2  Outpatient Diabetes medications: none Current orders for Inpatient glycemic control:  Novolog 0-9 units tid  Inpatient Diabetes Program Recommendations:    Will see pt today. A1c 11.2% goal 8% or less. Based on glucose trends here may only need Tradjenta 5 mg Daily (fecally cleared) at home with monitoring. Glucose trends to stay less than 180 at goal.  Spoke w/pt and daughter on 8/31 see note.  Thanks,  Tama Headings RN, MSN, BC-ADM Inpatient Diabetes Coordinator Team Pager [phone] (8a-5p)

## 2019-10-15 NOTE — Plan of Care (Signed)

## 2019-10-15 NOTE — Progress Notes (Signed)
Inpatient Diabetes Program Recommendations  AACE/ADA: New Consensus Statement on Inpatient Glycemic Control (2015)  Target Ranges:  Prepandial:   less than 140 mg/dL      Peak postprandial:   less than 180 mg/dL (1-2 hours)      Critically ill patients:  140 - 180 mg/dL   Lab Results  Component Value Date   GLUCAP 216 (H) 10/15/2019   HGBA1C 11.2 (H) 10/12/2019    Review of Glycemic Control  Diabetes history: DM 2  Outpatient Diabetes medications: none Current orders for Inpatient glycemic control:  Novolog 0-9 units tid  Inpatient Diabetes Program Recommendations:    Will see pt today. A1c 11.2% goal 8% or less. Based on glucose trends here may only need Tradjenta 5 mg Daily (fecally cleared) at home with monitoring. Glucose trends to stay less than 180 at goal.  Spoke w/pt and daughter on 8/31 see note.  - start Tradjenta 5 mg Daily.  Thanks,  Tama Headings RN, MSN, BC-ADM Inpatient Diabetes Coordinator Team Pager [phone] (8a-5p)

## 2019-10-16 LAB — BASIC METABOLIC PANEL
Anion gap: 11 (ref 5–15)
BUN: 34 mg/dL — ABNORMAL HIGH (ref 8–23)
CO2: 26 mmol/L (ref 22–32)
Calcium: 9.2 mg/dL (ref 8.9–10.3)
Chloride: 98 mmol/L (ref 98–111)
Creatinine, Ser: 2.49 mg/dL — ABNORMAL HIGH (ref 0.44–1.00)
GFR calc Af Amer: 19 mL/min — ABNORMAL LOW (ref >60)
GFR calc non Af Amer: 17 mL/min — ABNORMAL LOW (ref >60)
Glucose, Bld: 174 mg/dL — ABNORMAL HIGH (ref 70–99)
Potassium: 4.3 mmol/L (ref 3.5–5.1)
Sodium: 135 mmol/L (ref 135–145)

## 2019-10-16 LAB — GLUCOSE, CAPILLARY
Glucose-Capillary: 160 mg/dL — ABNORMAL HIGH (ref 70–99)
Glucose-Capillary: 154 mg/dL — ABNORMAL HIGH (ref 70–99)
Glucose-Capillary: 224 mg/dL — ABNORMAL HIGH (ref 70–99)
Glucose-Capillary: 204 mg/dL — ABNORMAL HIGH (ref 70–99)

## 2019-10-16 LAB — CBC
HCT: 29.1 % — ABNORMAL LOW (ref 36.0–46.0)
Hemoglobin: 8.1 g/dL — ABNORMAL LOW (ref 12.0–15.0)
MCH: 20.1 pg — ABNORMAL LOW (ref 26.0–34.0)
MCHC: 27.8 g/dL — ABNORMAL LOW (ref 30.0–36.0)
MCV: 72.4 fL — ABNORMAL LOW (ref 80.0–100.0)
Platelets: 168 10*3/uL (ref 150–400)
RBC: 4.02 MIL/uL (ref 3.87–5.11)
RDW: 22.8 % — ABNORMAL HIGH (ref 11.5–15.5)
WBC: 7 10*3/uL (ref 4.0–10.5)
nRBC: 0 % (ref 0.0–0.2)

## 2019-10-16 LAB — TYPE AND SCREEN
ABO/RH(D): A POS
Antibody Screen: NEGATIVE
Unit division: 0
Unit division: 0

## 2019-10-16 LAB — BPAM RBC
Blood Product Expiration Date: 202109112359
Blood Product Expiration Date: 202109112359
Unit Type and Rh: 6200
Unit Type and Rh: 6200

## 2019-10-16 MED ORDER — LACTATED RINGERS IV SOLN: INTRAVENOUS | Status: DC

## 2019-10-16 MED ORDER — METHOCARBAMOL 1000 MG/10ML IJ SOLN
500.0000 mg | Freq: Four times a day (QID) | INTRAVENOUS | Status: DC | PRN
  Filled 2019-10-16: qty 5

## 2019-10-16 MED ORDER — CYCLOBENZAPRINE HCL 5 MG PO TABS
7.5000 mg | ORAL_TABLET | Freq: Three times a day (TID) | ORAL | Status: DC
  Administered 2019-10-16 – 2019-10-17 (×3): 7.5 mg via ORAL
  Filled 2019-10-16 (×5): qty 1.5

## 2019-10-16 MED ORDER — DICLOFENAC SODIUM 1 % EX GEL
2.0000 g | Freq: Four times a day (QID) | CUTANEOUS | Status: DC
  Administered 2019-10-16 – 2019-10-17 (×4): 2 g via TOPICAL
  Filled 2019-10-16: qty 100

## 2019-10-16 NOTE — NC FL2 (Signed)
[place] LEVEL OF CARE SCREENING TOOL     IDENTIFICATION  Patient Name: Catherine Rich Birthdate: Nov 12, 1928 Sex: female Admission Date (Current Location): 10/12/2019  [facility] and [place] Number:  [employer] and Address:  [facility] [place], [place] [address] [place], [place] [address]      Provider Number: [id number]  Attending Physician Name and Address:  Tawni Millers,*  Relative Name and Phone Number:  Junie Spencer, daughter [phone]    Current Level of Care: Hospital Recommended Level of Care: [place] Prior Approval Number:    Date Approved/Denied:   PASRR Number: [id number]  Discharge Plan: SNF    Current Diagnoses: Patient Active Problem List   Diagnosis Date Noted  . Elective surgery   . Hypothyroidism 10/12/2019  . HTN (hypertension) 10/12/2019  . Chronic respiratory failure with hypoxia ([place]) 10/12/2019  . DM (diabetes mellitus) type II controlled with renal manifestation ([place]) 10/12/2019  . Closed nondisplaced intertrochanteric fracture of right femur ([place]) 10/12/2019  . Chronic diastolic CHF (congestive heart failure) ([place]) 10/12/2019  . Osteoarthritis of right knee 08/02/2018  . S/P TKR (total knee replacement) using cement, right 08/02/2018    Orientation RESPIRATION BLADDER Height & Weight     Self, Time, Situation, Place  Normal, O2 (Nasal cannula 1 liter) Incontinent Weight: 77.1 kg Height:  5\' 3"  (160 cm)  BEHAVIORAL SYMPTOMS/MOOD NEUROLOGICAL BOWEL NUTRITION STATUS      Continent Diet (please see dc summary)  AMBULATORY STATUS COMMUNICATION OF NEEDS Skin   Limited Assist Verbally Surgical wounds (close incision on hip)                       Personal Care Assistance Level of Assistance  Bathing, Feeding, Dressing Bathing Assistance: Maximum assistance Feeding assistance: Independent Dressing Assistance: Limited assistance     Functional Limitations Info              SPECIAL CARE FACTORS FREQUENCY  PT (By licensed PT), OT (By licensed OT)     PT Frequency: 5 times per week OT Frequency: 5 times per week            Contractures Contractures Info: Not present    Additional Factors Info  Code Status, Allergies, Psychotropic, Insulin Sliding Scale Code Status Info: DNR Allergies Info: Penicillins, Betadine (Povidone Iodine), Eggs Or Egg-derived Products, Other, Codeine, Sulfa Antibiotics Psychotropic Info: xanax Insulin Sliding Scale Info: see dc summary for dose       Current Medications (10/16/2019):  This is the current hospital active medication list Current Facility-Administered Medications  Medication Dose Route Frequency Provider Last Rate Last Admin  . acetaminophen (TYLENOL) tablet 1,000 mg  1,000 mg Oral TID Rod Can, MD   1,000 mg at 10/16/19 1533  . ALPRAZolam Duanne Moron) tablet 1 mg  1 mg Oral QHS Rod Can, MD   1 mg at 10/15/19 2225  . aspirin EC tablet 325 mg  325 mg Oral Q breakfast Rod Can, MD   325 mg at 10/16/19 0813  . atorvastatin (LIPITOR) tablet 40 mg  40 mg Oral Daily Rod Can, MD   40 mg at 10/16/19 0813  . Chlorhexidine Gluconate Cloth 2 % PADS 6 each  6 each Topical Daily Rod Can, MD   6 each at 10/16/19 1210  . cholecalciferol (VITAMIN D3) tablet 2,000 Units  2,000 Units Oral Daily Rod Can, MD   2,000 Units at 10/16/19 0813  . cyclobenzaprine (FLEXERIL) tablet  7.5 mg  7.5 mg Oral TID Tawni Millers, MD   7.5 mg at 10/16/19 1533  . diclofenac Sodium (VOLTAREN) 1 % topical gel 2 g  2 g Topical QID Arrien, Jimmy Picket, MD   2 g at 10/16/19 1535  . docusate sodium (COLACE) capsule 100 mg  100 mg Oral BID Rod Can, MD   100 mg at 10/16/19 6269  . gabapentin (NEURONTIN) capsule 300 mg  300 mg Oral QHS Arrien, Jimmy Picket, MD   300 mg at 10/15/19 2226  . HYDROmorphone (DILAUDID) injection 1 mg  1 mg Intravenous Q4H PRN Arrien, Jimmy Picket, MD       . insulin aspart (novoLOG) injection 0-9 Units  0-9 Units Subcutaneous TID WC Rod Can, MD   2 Units at 10/16/19 1152  . lactated ringers infusion   Intravenous Continuous Tawni Millers, MD 50 mL/hr at 10/16/19 1304 New Bag at 10/16/19 1304  . levothyroxine (SYNTHROID) tablet 112 mcg  112 mcg Oral QAC breakfast Rod Can, MD   112 mcg at 10/16/19 [phone]  . loratadine (CLARITIN) tablet 10 mg  10 mg Oral Daily Rod Can, MD   10 mg at 10/16/19 0813  . menthol-cetylpyridinium (CEPACOL) lozenge 3 mg  1 lozenge Oral PRN Swinteck, Aaron Edelman, MD       Or  . phenol (CHLORASEPTIC) mouth spray 1 spray  1 spray Mouth/Throat PRN Swinteck, Aaron Edelman, MD      . methocarbamol (ROBAXIN) 500 mg in dextrose 5 % 50 mL IVPB  500 mg Intravenous Q6H PRN Arrien, Jimmy Picket, MD      . metoCLOPramide (REGLAN) tablet 5-10 mg  5-10 mg Oral Q8H PRN Swinteck, Aaron Edelman, MD       Or  . metoCLOPramide (REGLAN) injection 5-10 mg  5-10 mg Intravenous Q8H PRN Swinteck, Aaron Edelman, MD      . neomycin-polymyxin b-dexamethasone (MAXITROL) ophthalmic suspension 2 drop  2 drop Both Eyes Q6H Rod Can, MD   2 drop at 10/16/19 1153  . ofloxacin (OCUFLOX) 0.3 % ophthalmic solution 1 drop  1 drop Both Eyes See admin instructions Swinteck, Aaron Edelman, MD      . ondansetron [facility]) tablet 4 mg  4 mg Oral Q6H PRN Swinteck, Aaron Edelman, MD       Or  . ondansetron (ZOFRAN) injection 4 mg  4 mg Intravenous Q6H PRN Swinteck, Aaron Edelman, MD      . oxyCODONE (Oxy IR/ROXICODONE) immediate release tablet 5 mg  5 mg Oral Q4H PRN Arrien, Jimmy Picket, MD   5 mg at 10/16/19 1050  . pantoprazole (PROTONIX) EC tablet 40 mg  40 mg Oral Daily Rod Can, MD   40 mg at 10/16/19 0813  . senna (SENOKOT) tablet 8.6 mg  1 tablet Oral BID Rod Can, MD   8.6 mg at 10/16/19 6270  . vitamin B-12 (CYANOCOBALAMIN) tablet 1,000 mcg  1,000 mcg Oral Daily Rod Can, MD   1,000 mcg at 10/16/19 0813     Discharge Medications: Please see  discharge summary for a list of discharge medications.  Relevant Imaging Results:  Relevant Lab Results:   Additional Information [id number]    has received both COVID vaccines  Hyman Hopes, RN

## 2019-10-16 NOTE — Progress Notes (Signed)
PROGRESS NOTE    Catherine Rich  OBS:[id number] DOB: 03-21-1928 DOA: 10/12/2019 PCP: Caryl Bis, MD    Brief Narrative:  Patient admitted to the hospital with the working diagnosis of nondisplaced right intratrochanteric fracture.  86 year old female with past medical history for chronic kidney disease stage III, hypertension, GERD, type 2 diabetes mellitus, dyslipidemia and COPD who presented after a mechanical fall.  After patient falling down on the floor she felt immediate onset of pain at the right hip, with inability to bear weight.  On her initial physical examination her temperature was 98.4, blood pressure 204/57, heart rate 72 and respiratory 12, her lungs are clear to auscultation bilaterally, heart S1-S2, present rhythmic, abdomen was soft, she had no lower extremity edema. Right hip x-rays with nondisplaced intertrochanteric fracture of the right femur with a mildly displaced lesser trochanteric fracture fragment.  Patient was admitted to the medical ward, received DVT prophylaxis and analgesics.  She underwent intramedullary fixation of the right femur per orthopedics on August 30.  She received intraoperative 1 unit packed red blood cells.  Patient with persistent post operative pain, and poor oral intake. Worsening renal function per serum creatinine.    Assessment & Plan:   Principal Problem:   Closed nondisplaced intertrochanteric fracture of right femur (HCC) Active Problems:   Hypothyroidism   HTN (hypertension)   Chronic respiratory failure with hypoxia (HCC)   DM (diabetes mellitus) type II controlled with renal manifestation (HCC)   Chronic diastolic CHF (congestive heart failure) ([place])   Elective surgery    1. Right hip fracture. Patient with significant pain, worse with movement and touch. Has muscle cramps.   On scheduled acetaminophen 1000 mg tid, ibuprofen tid, as needed hydromorphone oxycodone and methocarbamol. Change flexeril to tid.    Continue physical therapy and occupational therapy, patient will need SNF.   2. HTN. Holding blood pressure medications for now to prevent hypotension.   3. Hypothyroid. On levothyroxine   4. T2DM/ dyslipidemia. Fasting glucose is 174 this am, will continue with gucose cover and monitoring with insulin sliding scale. Patient with poor oral intake.   On atorvastatin.   5, Chronic diastolic heart failure. No clinical signs of exacerbation, continue to hold on diuretics.  6. Anxiety. On alprazolam at night.   7. AKI on CKD stage 4/ hyperkalemia. Patient with worsening renal function, serum cr up to 2,49 from 2,09, K at 4,3 and serum bicarbonate at 26.  Will add gentle hydration with balanced electrolyte solutions at 50 ml per H and will follow up on renal function in am.   8. Anemia of chronic renal disease/ post operative anemia.  Hgb stable at 8,1 with Hct at 29.2 Will continue close monitoring. Transfusion threshold is hgb of 7.0   Status is: Inpatient  Remains inpatient appropriate because:Ongoing active pain requiring inpatient pain management   Dispo: The patient is from: Home              Anticipated d/c is to: SNF              Anticipated d/c date is: 2 days              Patient currently is not medically stable to d/c.   DVT prophylaxis: Aspirin  Code Status:   full  Family Communication:  I spoke with patient's daughter at the bedside, we talked in detail about patient's condition, plan of care and prognosis and all questions were addressed.     Consultants:  orthopedics  Procedures:   Right hip  intramedullary fixation of femur    Subjective: Patient continue to have pain on her left leg, positive muscle cramps, no nausea or vomiting, no chest pain or dyspnea, positive decrease po intake.   Objective: Vitals:   10/15/19 0744 10/15/19 1455 10/15/19 2019 10/16/19 0536  BP: (!) 163/50 (!) 140/41 (!) 135/45 (!) 130/46  Pulse: 83 75 84 76  Resp:  18 17 16 15   Temp: 98.3 F (36.8 C) 98 F (36.7 C) 98 F (36.7 C) 97.7 F (36.5 C)  TempSrc: Oral Oral Oral Oral  SpO2: 91% 91% 90% (!) 86%  Weight:      Height:        Intake/Output Summary (Last 24 hours) at 10/16/2019 1131 Last data filed at 10/16/2019 0600 Gross per 24 hour  Intake 240 ml  Output --  Net 240 ml   Filed Weights   10/12/19 1718  Weight: 77.1 kg    Examination:   General: Not in pain or dyspnea, deconditioned  Neurology: Awake and alert, non focal  E ENT: positive pallor, no icterus, oral mucosa dry Cardiovascular: No JVD. S1-S2 present, rhythmic, no gallops, rubs, or murmurs. No lower extremity edema. Pulmonary: positive breath sounds bilaterally, adequate air movement, no wheezing, rhonchi or rales. Gastrointestinal. Abdomen soft and non tender Skin. No rashes Musculoskeletal: no joint deformities     Data Reviewed: I have personally reviewed following labs and imaging studies  CBC: Recent Labs  Lab 10/12/19 1712 10/13/19 0959 10/14/19 0444 10/15/19 0551 10/16/19 0602  WBC 5.6 5.6 6.9 7.2 7.0  NEUTROABS 2.9 3.4 4.4  --   --   HGB 8.2* 8.1* 8.3* 8.8* 8.1*  HCT 30.3* 30.5* 30.2* 30.3* 29.1*  MCV 70.1* 71.6* 72.4* 72.8* 72.4*  PLT 159 128* 136* 141* 098   Basic Metabolic Panel: Recent Labs  Lab 10/13/19 0743 10/13/19 0959 10/14/19 0444 10/15/19 0259 10/16/19 0602  NA 135 135 135 136 135  K 4.7 4.9 4.8 5.3* 4.3  CL 103 104 100 99 98  CO2 24 23 27 24 26   GLUCOSE 173* 175* 192* 209* 174*  BUN 30* 30* 27* 31* 34*  CREATININE 1.86* 1.83* 1.99* 2.09* 2.49*  CALCIUM 8.1* 8.3* 8.4* 8.8* 9.2  MG  --  1.7  --  2.3  --    GFR: Estimated Creatinine Clearance: 15.1 mL/min (A) (by C-G formula based on SCr of 2.49 mg/dL (H)). Liver Function Tests: Recent Labs  Lab 10/13/19 0959  AST 19  ALT 14  ALKPHOS 78  BILITOT 1.1  PROT 6.6  ALBUMIN 3.1*   No results for input(s): LIPASE, AMYLASE in the last 168 hours. No results for input(s):  AMMONIA in the last 168 hours. Coagulation Profile: Recent Labs  Lab 10/12/19 1712  INR 1.1   Cardiac Enzymes: No results for input(s): CKTOTAL, CKMB, CKMBINDEX, TROPONINI in the last 168 hours. BNP (last 3 results) No results for input(s): PROBNP in the last 8760 hours. HbA1C: No results for input(s): HGBA1C in the last 72 hours. CBG: Recent Labs  Lab 10/15/19 0632 10/15/19 1129 10/15/19 1618 10/15/19 2049 10/16/19 0637  GLUCAP 216* 168* 96 193* 160*   Lipid Profile: No results for input(s): CHOL, HDL, LDLCALC, TRIG, CHOLHDL, LDLDIRECT in the last 72 hours. Thyroid Function Tests: No results for input(s): TSH, T4TOTAL, FREET4, T3FREE, THYROIDAB in the last 72 hours. Anemia Panel: No results for input(s): VITAMINB12, FOLATE, FERRITIN, TIBC, IRON, RETICCTPCT in the last 72 hours.  Radiology Studies: I have reviewed all of the imaging during this hospital visit personally     Scheduled Meds: . acetaminophen  1,000 mg Oral TID  . ALPRAZolam  1 mg Oral QHS  . aspirin EC  325 mg Oral Q breakfast  . atorvastatin  40 mg Oral Daily  . Chlorhexidine Gluconate Cloth  6 each Topical Daily  . cholecalciferol  2,000 Units Oral Daily  . docusate sodium  100 mg Oral BID  . gabapentin  300 mg Oral QHS  . insulin aspart  0-9 Units Subcutaneous TID WC  . levothyroxine  112 mcg Oral QAC breakfast  . loratadine  10 mg Oral Daily  . neomycin-polymyxin b-dexamethasone  2 drop Both Eyes Q6H  . ofloxacin  1 drop Both Eyes See admin instructions  . pantoprazole  40 mg Oral Daily  . senna  1 tablet Oral BID  . vitamin B-12  1,000 mcg Oral Daily   Continuous Infusions:   LOS: 4 days        Keelan Pomerleau Gerome Apley, MD

## 2019-10-16 NOTE — TOC Initial Note (Signed)
Transition of Care [facility]) - Initial/Assessment Note    Patient Details  Name: Catherine Rich MRN: [id number] Date of Birth: [birth year]  Transition of [facility]) CM/SW Contact:    Hyman Hopes, RN Phone Number: 10/16/2019, 3:32 PM  Clinical Narrative:     RN Case manager received consult for skilled nursing facility placement.  RN CM spoke to patient and patient willing to go to [facility] where she has been on prior occasions.  Patient states that she is accepting [facility] as transport.  Patient states that she has had both of her COVID vaccinations.    Will follow up on bed orders.         Expected Discharge Plan: Skilled Nursing Facility Barriers to Discharge: No Barriers Identified   Patient Goals and CMS Choice Patient states their goals for this hospitalization and ongoing recovery are:: to go to skilled nursing facility for rehab. CMS Medicare.gov Compare Post Acute Care list provided to:: Patient Choice offered to / list presented to : Patient  Expected Discharge Plan and Services Expected Discharge Plan: [place] In-house Referral: NA Discharge Planning Services: CM Consult Post Acute Care Choice: [place] Living arrangements for the past 2 months: Single Family Home                 DME Arranged: N/A                    Prior Living Arrangements/Services Living arrangements for the past 2 months: Single Family Home Lives with:: Self Patient language and need for interpreter reviewed:: No Do you feel safe going back to the place where you live?: Yes      Need for Family Participation in Patient Care: No (Comment) Care giver support system in place?: Yes (comment)   Criminal Activity/Legal Involvement Pertinent to Current Situation/Hospitalization: No - Comment as needed  Activities of Daily Living Home Assistive Devices/Equipment: Walker (specify type) ADL Screening (condition at time of admission) Patient's cognitive  ability adequate to safely complete daily activities?: Yes Is the patient deaf or have difficulty hearing?: No Does the patient have difficulty seeing, even when wearing glasses/contacts?: No Does the patient have difficulty concentrating, remembering, or making decisions?: No Patient able to express need for assistance with ADLs?: No Does the patient have difficulty dressing or bathing?: No Independently performs ADLs?: No Communication: Independent Dressing (OT): Independent Grooming: Needs assistance Is this a change from baseline?: Change from baseline, expected to last <3 days Feeding: Dependent Does the patient have difficulty walking or climbing stairs?: Yes Weakness of Legs: Right Weakness of Arms/Hands: None  Permission Sought/Granted Permission sought to share information with : Case Manager       Permission granted to share info w AGENCY: SNFs        Emotional Assessment Appearance:: Appears stated age Attitude/Demeanor/Rapport: Gracious Affect (typically observed): Appropriate Orientation: : Oriented to Self, Oriented to Place, Oriented to  Time, Oriented to Situation Alcohol / Substance Use: Not Applicable Psych Involvement: No (comment)  Admission diagnosis:  Essential hypertension [I10] Closed nondisplaced intertrochanteric fracture of right femur, initial encounter ([place]) [S72.144A] Intertrochanteric fx-closed, right, initial encounter [facility]) [S72.141A] Patient Active Problem List   Diagnosis Date Noted  . Elective surgery   . Hypothyroidism 10/12/2019  . HTN (hypertension) 10/12/2019  . Chronic respiratory failure with hypoxia ([place]) 10/12/2019  . DM (diabetes mellitus) type II controlled with renal manifestation ([place]) 10/12/2019  . Closed nondisplaced intertrochanteric fracture of right femur ([place]) 10/12/2019  .  Chronic diastolic CHF (congestive heart failure) ([place]) 10/12/2019  . Osteoarthritis of right knee 08/02/2018  . S/P TKR (total knee replacement)  using cement, right 08/02/2018   PCP:  Caryl Bis, MD Pharmacy:   [facility], [place]  [address] Phone: [phone] Fax: [phone]     Social Determinants of Health (SDOH) Interventions    Readmission Risk Interventions No flowsheet data found.

## 2019-10-16 NOTE — Plan of Care (Signed)

## 2019-10-16 NOTE — Progress Notes (Signed)
Physical Therapy Treatment Patient Details Name: Catherine Rich MRN: [id number] DOB: August 09, 1928 Today's Date: 10/16/2019    History of Present Illness Pt is an 86 yo female who presents with R hip fx after falling at home. She underwent IM nailing on 8/30.   PMH: hypothyroid, HTN, chronic respiratory failure, DM2, CHF, CKD3, GERD, macular degeneration, CVA, R TKA.     PT Comments    Pt supine in bed on arrival this session.  Pt motivated to move OOB to recliner chair.  Utilized shoes for OOB and she reports L foot feeling better.  Continue to recommend snf placement at d/c as she continues to require mod assistance.    Follow Up Recommendations  SNF;Supervision/Assistance - 24 hour     Equipment Recommendations       Recommendations for Other Services       Precautions / Restrictions Precautions Precautions: Fall Precaution Comments: bruised/painful toes on L foot Restrictions Weight Bearing Restrictions: Yes RLE Weight Bearing: Weight bearing as tolerated    Mobility  Bed Mobility Overal bed mobility: Needs Assistance Bed Mobility: Supine to Sit     Supine to sit: Mod assist;HOB elevated     General bed mobility comments: Assist to elevate trunk into sitting and advance LEs to edge of bed.  Transfers Overall transfer level: Needs assistance Equipment used: Rolling walker (2 wheeled) Transfers: Sit to/from Stand Sit to Stand: Mod assist         General transfer comment: Cues for hand placement and assistance to rise into standing.  Ambulation/Gait Ambulation/Gait assistance: Mod assist Gait Distance (Feet): 6 Feet Assistive device: Rolling walker (2 wheeled) Gait Pattern/deviations: Step-to pattern;Trunk flexed;Antalgic;Decreased stride length     General Gait Details: Continued Heavy reliance on B UEs in standing.  Pt required cues for sequencing and assistance to maintain close proximity to RW.  Able to step away from bed and back to recliner.  Utilized  shoes and reports L foot feeling better.   Stairs             Wheelchair Mobility    Modified Rankin (Stroke Patients Only)       Balance                                            Cognition Arousal/Alertness: Awake/alert Behavior During Therapy: WFL for tasks assessed/performed Overall Cognitive Status: History of cognitive impairments - at baseline                                        Exercises      General Comments        Pertinent Vitals/Pain Pain Assessment: Faces Faces Pain Scale: Hurts whole lot Pain Location: R thigh spasm and L 2nd and 3rd toes Pain Descriptors / Indicators: Spasm;Sore Pain Intervention(s): Monitored during session;Repositioned    Home Living                      Prior Function            PT Goals (current goals can now be found in the care plan section) Acute Rehab PT Goals Patient Stated Goal: return to independence Potential to Achieve Goals: Good Progress towards PT goals: Progressing toward goals    Frequency  Min 3X/week      PT Plan Current plan remains appropriate    Co-evaluation              AM-PAC PT "6 Clicks" Mobility   Outcome Measure  Help needed turning from your back to your side while in a flat bed without using bedrails?: A Lot Help needed moving from lying on your back to sitting on the side of a flat bed without using bedrails?: A Lot Help needed moving to and from a bed to a chair (including a wheelchair)?: A Lot Help needed standing up from a chair using your arms (e.g., wheelchair or bedside chair)?: A Lot Help needed to walk in hospital room?: A Lot Help needed climbing 3-5 steps with a railing? : Total 6 Click Score: 11    End of Session Equipment Utilized During Treatment: Gait belt Activity Tolerance: Patient limited by pain Patient left: in chair;with call bell/phone within reach;with chair alarm set;with family/visitor  present Nurse Communication: Mobility status PT Visit Diagnosis: Unsteadiness on feet (R26.81);Muscle weakness (generalized) (M62.81);Pain;Difficulty in walking, not elsewhere classified (R26.2) Pain - Right/Left: Right Pain - part of body: Hip     Time: 3748-2707 PT Time Calculation (min) (ACUTE ONLY): 21 min  Charges:  $Therapeutic Activity: 8-22 mins                     Catherine Rich , PTA Acute Rehabilitation Services Pager [phone] Office [phone]     Catherine Rich 10/16/2019, 6:50 PM

## 2019-10-17 DIAGNOSIS — N184 Chronic kidney disease, stage 4 (severe): Secondary | ICD-10-CM | POA: Diagnosis not present

## 2019-10-17 DIAGNOSIS — S72001D Fracture of unspecified part of neck of right femur, subsequent encounter for closed fracture with routine healing: Secondary | ICD-10-CM | POA: Diagnosis not present

## 2019-10-17 DIAGNOSIS — M6281 Muscle weakness (generalized): Secondary | ICD-10-CM | POA: Diagnosis not present

## 2019-10-17 DIAGNOSIS — I959 Hypotension, unspecified: Secondary | ICD-10-CM | POA: Diagnosis not present

## 2019-10-17 DIAGNOSIS — R0602 Shortness of breath: Secondary | ICD-10-CM | POA: Diagnosis not present

## 2019-10-17 DIAGNOSIS — F411 Generalized anxiety disorder: Secondary | ICD-10-CM | POA: Diagnosis not present

## 2019-10-17 DIAGNOSIS — E1122 Type 2 diabetes mellitus with diabetic chronic kidney disease: Secondary | ICD-10-CM | POA: Diagnosis not present

## 2019-10-17 DIAGNOSIS — I5032 Chronic diastolic (congestive) heart failure: Secondary | ICD-10-CM | POA: Diagnosis not present

## 2019-10-17 DIAGNOSIS — I10 Essential (primary) hypertension: Secondary | ICD-10-CM | POA: Diagnosis not present

## 2019-10-17 DIAGNOSIS — Z9181 History of falling: Secondary | ICD-10-CM | POA: Diagnosis not present

## 2019-10-17 DIAGNOSIS — E875 Hyperkalemia: Secondary | ICD-10-CM | POA: Diagnosis not present

## 2019-10-17 DIAGNOSIS — J9611 Chronic respiratory failure with hypoxia: Secondary | ICD-10-CM | POA: Diagnosis not present

## 2019-10-17 DIAGNOSIS — M2550 Pain in unspecified joint: Secondary | ICD-10-CM | POA: Diagnosis not present

## 2019-10-17 DIAGNOSIS — S72144A Nondisplaced intertrochanteric fracture of right femur, initial encounter for closed fracture: Secondary | ICD-10-CM | POA: Diagnosis not present

## 2019-10-17 DIAGNOSIS — J449 Chronic obstructive pulmonary disease, unspecified: Secondary | ICD-10-CM | POA: Diagnosis not present

## 2019-10-17 DIAGNOSIS — N179 Acute kidney failure, unspecified: Secondary | ICD-10-CM | POA: Diagnosis not present

## 2019-10-17 DIAGNOSIS — D649 Anemia, unspecified: Secondary | ICD-10-CM | POA: Diagnosis not present

## 2019-10-17 DIAGNOSIS — S72144D Nondisplaced intertrochanteric fracture of right femur, subsequent encounter for closed fracture with routine healing: Secondary | ICD-10-CM | POA: Diagnosis not present

## 2019-10-17 DIAGNOSIS — R41841 Cognitive communication deficit: Secondary | ICD-10-CM | POA: Diagnosis not present

## 2019-10-17 DIAGNOSIS — Z7401 Bed confinement status: Secondary | ICD-10-CM | POA: Diagnosis not present

## 2019-10-17 DIAGNOSIS — S72141D Displaced intertrochanteric fracture of right femur, subsequent encounter for closed fracture with routine healing: Secondary | ICD-10-CM | POA: Diagnosis not present

## 2019-10-17 DIAGNOSIS — M1990 Unspecified osteoarthritis, unspecified site: Secondary | ICD-10-CM | POA: Diagnosis not present

## 2019-10-17 DIAGNOSIS — E785 Hyperlipidemia, unspecified: Secondary | ICD-10-CM | POA: Diagnosis not present

## 2019-10-17 DIAGNOSIS — R52 Pain, unspecified: Secondary | ICD-10-CM | POA: Diagnosis not present

## 2019-10-17 DIAGNOSIS — S79929A Unspecified injury of unspecified thigh, initial encounter: Secondary | ICD-10-CM | POA: Diagnosis not present

## 2019-10-17 DIAGNOSIS — N189 Chronic kidney disease, unspecified: Secondary | ICD-10-CM | POA: Diagnosis not present

## 2019-10-17 DIAGNOSIS — R2689 Other abnormalities of gait and mobility: Secondary | ICD-10-CM | POA: Diagnosis not present

## 2019-10-17 DIAGNOSIS — E039 Hypothyroidism, unspecified: Secondary | ICD-10-CM | POA: Diagnosis not present

## 2019-10-17 LAB — BASIC METABOLIC PANEL
Anion gap: 9 (ref 5–15)
BUN: 38 mg/dL — ABNORMAL HIGH (ref 8–23)
CO2: 25 mmol/L (ref 22–32)
Calcium: 8.9 mg/dL (ref 8.9–10.3)
Chloride: 100 mmol/L (ref 98–111)
Creatinine, Ser: 2.36 mg/dL — ABNORMAL HIGH (ref 0.44–1.00)
GFR calc Af Amer: 20 mL/min — ABNORMAL LOW (ref >60)
GFR calc non Af Amer: 18 mL/min — ABNORMAL LOW (ref >60)
Glucose, Bld: 200 mg/dL — ABNORMAL HIGH (ref 70–99)
Potassium: 4.3 mmol/L (ref 3.5–5.1)
Sodium: 134 mmol/L — ABNORMAL LOW (ref 135–145)

## 2019-10-17 LAB — BPAM RBC
Blood Product Expiration Date: 202109252359
Blood Product Expiration Date: 202109252359
ISSUE DATE / TIME: 202108301448
Unit Type and Rh: 6200
Unit Type and Rh: 6200

## 2019-10-17 LAB — TYPE AND SCREEN
ABO/RH(D): A POS
Antibody Screen: NEGATIVE
Unit division: 0
Unit division: 0

## 2019-10-17 LAB — GLUCOSE, CAPILLARY
Glucose-Capillary: 184 mg/dL — ABNORMAL HIGH (ref 70–99)
Glucose-Capillary: 175 mg/dL — ABNORMAL HIGH (ref 70–99)

## 2019-10-17 LAB — SARS CORONAVIRUS 2 BY RT PCR (HOSPITAL ORDER, PERFORMED IN ~~LOC~~ HOSPITAL LAB): SARS Coronavirus 2: NEGATIVE

## 2019-10-17 MED ORDER — AMLODIPINE BESYLATE 5 MG PO TABS
5.0000 mg | ORAL_TABLET | Freq: Every day | ORAL | Status: DC
  Administered 2019-10-17: 5 mg via ORAL
  Filled 2019-10-17: qty 1

## 2019-10-17 MED ORDER — DICLOFENAC SODIUM 1 % EX GEL: 2.0000 g | Freq: Four times a day (QID) | CUTANEOUS | 0 refills | Status: AC

## 2019-10-17 MED ORDER — DOCUSATE SODIUM 100 MG PO CAPS: 100.0000 mg | ORAL_CAPSULE | Freq: Two times a day (BID) | ORAL | 0 refills | Status: AC

## 2019-10-17 MED ORDER — AMLODIPINE BESYLATE 5 MG PO TABS: 5.0000 mg | ORAL_TABLET | Freq: Every day | ORAL | 0 refills | Status: AC

## 2019-10-17 MED ORDER — ALPRAZOLAM 0.5 MG PO TABS: 1.0000 mg | ORAL_TABLET | Freq: Every day | ORAL | 0 refills | Status: AC

## 2019-10-17 MED ORDER — CYCLOBENZAPRINE HCL 7.5 MG PO TABS: 7.5000 mg | ORAL_TABLET | Freq: Three times a day (TID) | ORAL | 0 refills | Status: AC | PRN

## 2019-10-17 MED ORDER — ACETAMINOPHEN 500 MG PO TABS: 1000.0000 mg | ORAL_TABLET | Freq: Four times a day (QID) | ORAL | 0 refills | Status: AC | PRN

## 2019-10-17 NOTE — TOC Progression Note (Addendum)
Transition of Care [facility]) - Progression Note    Patient Details  Name: Catherine Rich MRN: [id number] Date of Birth: [birth year]  Transition of Care [facility]) CM/SW Contact  Sharin Mons, RN Phone Number: 10/17/2019, 9:05 AM  Clinical Narrative:    [facility] reviewing for acceptance. Pt will need updated COVID for SNF placement.  TOC team will continue to monitor and follow ....   9/3 @ 10:48 am [facility] extended bed offer and pt accepted,  Expected Discharge Plan: [place] Barriers to Discharge: Continued Medical Work up, No SNF bed  Expected Discharge Plan and Services Expected Discharge Plan: [place] In-house Referral: NA Discharge Planning Services: CM Consult Post Acute Care Choice: [place] Living arrangements for the past 2 months: Single Family Home                 DME Arranged: N/A                     Social Determinants of Health (SDOH) Interventions    Readmission Risk Interventions No flowsheet data found.

## 2019-10-17 NOTE — Progress Notes (Signed)
Discharge package printed and sent with patient. Called report to nurse [name] at [facility].

## 2019-10-17 NOTE — Care Management Important Message (Signed)
Important Message  Patient Details  Name: Catherine Rich MRN: [id number] Date of Birth: 1928-07-12   Medicare Important Message Given:  Yes - Important Message mailed due to current National Emergency  Verbal consent obtained due to current National Emergency  Relationship to patient: Child Contact Name: Junie Spencer Call Date: 10/17/19  Time: 1135 Phone: [phone] Outcome: Spoke with contact Important Message mailed to: Patient address on file    Delorse Lek 10/17/2019, 11:36 AM

## 2019-10-17 NOTE — Consult Note (Signed)
   [facility] Inpatient Consult   10/17/2019  Catherine Rich 11-22-1928 [id number]   [facility] [ACO] Patient:  Medicare NextGen   Patient screened for transition/disposition from the  Hospital to check if potential [name] service needs.  Review of patient's medical record reveals patient transition  Is rehab.  Primary Care Provider is Gar Ponto, MD  Plan: Will alert [facility] RN PAC of transition to [facility] planned for today for rehab.  For questions contact:   Natividad Brood, RN BSN [facility] Liaison  [phone] business mobile phone Toll free office [phone]  Fax number: [phone] [name]

## 2019-10-17 NOTE — Plan of Care (Signed)

## 2019-10-17 NOTE — Discharge Summary (Signed)
Physician Discharge Summary  Catherine Rich YCX:[id number] DOB: 10-Aug-1928 DOA: 10/12/2019  PCP: Caryl Bis, MD  Admit date: 10/12/2019 Discharge date: 10/17/2019  Admitted From: Home  Disposition:  SNF   Recommendations for Outpatient Follow-up and new medication changes:  1. Follow up with Dr. Coralyn Mark in 7 days 2. Follow up on renal function in 7 days.  3. Discontinue losartan and furosemide due to acute worsening renal function  4. Placed on amlodipine for blood pressure control.  5. Weight bearing as tolerated   Home Health: na   Equipment/Devices: na    Discharge Condition: stable  CODE STATUS: dnr   Diet recommendation:  Heart healthy   Brief/Interim Summary: Patient admitted to the hospital with the working diagnosis of nondisplaced right intratrochanteric fracture.  86 year old female with past medical history for chronic kidney disease stage VI, hypertension, GERD, type 2 diabetes mellitus, dyslipidemia and COPD who presented after a mechanical fall. After patient falling down on the floor she felt immediate onset of pain at the right hip, with inability to bear weight. On her initial physical examination her temperature was 98.4, blood pressure 204/57, heart rate 72 and respiratory rate 12,her lungs were clear to auscultation bilaterally, heart S1-S2, present rhythmic, abdomen was soft, she had no lower extremity edema. Right hip x-rays with nondisplaced intertrochanteric fracture of the right femur with a mildly displaced lesser trochanteric fracture fragment.  Sodium 135, potassium 4.9, chloride 102, bicarb 21, glucose 181, BUN 30, creatinine 1.72, white count 5.6, hemoglobin 8.2, hematocrit 30.3, platelets 159.  SARS COVID-19 negative.  Chest radiograph with mild cardiomegaly, hilar vascular congestion.  EKG 78 bpm, normal axis, normal intervals, sinus rhythm, no ST segment or T wave changes, low voltage.  Patient was admitted to the medical ward, received DVT  prophylaxis and analgesics. She underwent intramedullary fixation of the right femur per orthopedics on August 30. She received intraoperative 1 unit packed red blood cells.  Patient with persistent post operative pain, and poor oral intake and worsening renal function per serum creatinine.   She received IV fluids with improvement on renal function, will need close follow up on renal function in 7 days.   1.  Right hip fracture.  Postoperatively she experienced pain that required further adjustment in analgesics and muscle relaxants. She received DVT prophylaxis with toleration.  Patient was seen by physical therapy, recommendations to continue recovery at the skilled nursing facility.  2.  Hypertension.  Losartan and furosemide were held due to worsening kidney function.  Patient will continue blood pressure control with amlodipine.  3.  Hypothyroidism.  Continue levothyroxine.  4.  Uncontrolled type 2 diabetes mellitus, hemoglobin A1c 11.2.  Dyslipidemia.  She was placed on insulin sliding scale for glucose coverage and monitoring.  Her p.o. intake has improved. Continue atorvastatin.  5.  History of diastolic heart failure.  No signs of acute exacerbation, diuretic therapy on hold due to worsening kidney function in the setting of poor oral intake.  6.  Anxiety.  Patient continue alprazolam.  7.  Acute kidney injury on chronic kidney disease stage III-IV, hyperkalemia.  Nephrotoxic agents were held, patient received gentle hydration with balance electrolyte solutions intravenously. Continue holding losartan and furosemide.  Discharge sodium 134, potassium 4.3, chloride 100, bicarb 25, BUN 30, creatinine 2.36.  Peak creatinine 2.49. Recommend follow-up kidney function as an outpatient.  8.  Anemia chronic renal disease, postoperative anemia.  Patient received packed red blood cells during her hospitalization, during surgery.  Discharge hemoglobin 8.1,  hematocrit 21.9   Discharge  Diagnoses:  Principal Problem:   Closed nondisplaced intertrochanteric fracture of right femur ([place]) Active Problems:   Hypothyroidism   HTN (hypertension)   Chronic respiratory failure with hypoxia (HCC)   DM (diabetes mellitus) type II controlled with renal manifestation (HCC)   Chronic diastolic CHF (congestive heart failure) ([place])   Elective surgery    Discharge Instructions   Allergies as of 10/17/2019      Reactions   Penicillins Anaphylaxis, Rash   Did it involve swelling of the face/tongue/throat, SOB, or low BP? Yes Did it involve sudden or severe rash/hives, skin peeling, or any reaction on the inside of your mouth or nose? Yes Did you need to seek medical attention at a hospital or doctor's office? Yes When did it last happen?38 YEARS AGO , IN HER 30S If all above answers are "NO", may proceed with cephalosporin use.   Betadine [povidone Iodine] Other (See Comments)   IRRITATION AROUND EYES WHEN GETTING EYE INJECTIONS    Eggs Or Egg-derived Products Diarrhea, Nausea And Vomiting   Other    icecream-diarrhea   Codeine Nausea And Vomiting   Sulfa Antibiotics Nausea And Vomiting      Medication List    STOP taking these medications   azithromycin 500 MG tablet Commonly known as: ZITHROMAX   furosemide 40 MG tablet Commonly known as: LASIX   irbesartan 300 MG tablet Commonly known as: AVAPRO     TAKE these medications   acetaminophen 500 MG tablet Commonly known as: TYLENOL Take 2 tablets (1,000 mg total) by mouth every 6 (six) hours as needed for moderate pain.   ALPRAZolam 0.5 MG tablet Commonly known as: XANAX Take 2 tablets (1 mg total) by mouth at bedtime.   amLODipine 5 MG tablet Commonly known as: NORVASC Take 1 tablet (5 mg total) by mouth daily. Start taking on: October 18, 2019   aspirin EC 81 MG tablet Take 81 mg by mouth daily. Swallow whole. What changed: Another medication with the same name was added. Make sure you understand  how and when to take each.   aspirin 81 MG chewable tablet Commonly known as: Aspirin Childrens Chew 1 tablet (81 mg total) by mouth 2 (two) times daily with a meal. What changed: You were already taking a medication with the same name, and this prescription was added. Make sure you understand how and when to take each.   atorvastatin 40 MG tablet Commonly known as: LIPITOR Take 40 mg by mouth daily.   cyclobenzaprine 7.5 MG tablet Commonly known as: FEXMID Take 1 tablet (7.5 mg total) by mouth 3 (three) times daily as needed for muscle spasms.   diclofenac Sodium 1 % Gel Commonly known as: VOLTAREN Apply 2 g topically 4 (four) times daily. Apply for the right thigh.   docusate sodium 100 MG capsule Commonly known as: COLACE Take 1 capsule (100 mg total) by mouth 2 (two) times daily.   gabapentin 300 MG capsule Commonly known as: NEURONTIN Take 300-600 mg by mouth at bedtime. DEPENDING ON PAIN , PT MAY TAKE 300 MG OR 600 MG AT BEDTIME   levothyroxine 112 MCG tablet Commonly known as: SYNTHROID Take 112 mcg by mouth daily before breakfast.   loratadine 10 MG tablet Commonly known as: CLARITIN Take 10 mg by mouth daily.   Lutein-Zeaxanthin 25-5 MG Caps Take 1 capsule by mouth daily.   multivitamin with minerals tablet Take 1 tablet by mouth daily.   neomycin-polymyxin-dexameth 0.1 %  Oint Commonly known as: MAXITROL Place 1 application into both eyes 3 (three) times daily.   ofloxacin 0.3 % ophthalmic solution Commonly known as: OCUFLOX Place 1 drop into both eyes See admin instructions. Pt to instill 1 drop in operative eye three times daily  2 days before and 3 days after eye injection   oxyCODONE-acetaminophen 7.5-325 MG tablet Commonly known as: Percocet Take 1 tablet by mouth every 4 (four) hours as needed for up to 7 days for severe pain.   pantoprazole 20 MG tablet Commonly known as: PROTONIX Take 20 mg by mouth daily.   triamcinolone cream 0.1 % Commonly  known as: KENALOG Apply 1 application topically 2 (two) times daily.   vitamin B-12 1000 MCG tablet Commonly known as: CYANOCOBALAMIN Take 1,000 mcg by mouth daily.   vitamin C 500 MG tablet Commonly known as: ASCORBIC ACID Take 500 mg by mouth daily.   Vitamin D 50 MCG (2000 UT) tablet Take 2,000 Units by mouth daily.       Follow-up Information    Swinteck, Aaron Edelman, MD. Schedule an appointment as soon as possible for a visit in 2 weeks.   Specialty: Orthopedic Surgery Why: For wound re-check Contact information: [address] [place] [address] [phone]              Allergies  Allergen Reactions  . Penicillins Anaphylaxis and Rash    Did it involve swelling of the face/tongue/throat, SOB, or low BP? Yes Did it involve sudden or severe rash/hives, skin peeling, or any reaction on the inside of your mouth or nose? Yes Did you need to seek medical attention at a hospital or doctor's office? Yes When did it last happen?55 YEARS AGO , IN HER 30S If all above answers are "NO", may proceed with cephalosporin use.   . Betadine [Povidone Iodine] Other (See Comments)    IRRITATION AROUND EYES WHEN GETTING EYE INJECTIONS   . Eggs Or Egg-Derived Products Diarrhea and Nausea And Vomiting  . Other     icecream-diarrhea  . Codeine Nausea And Vomiting  . Sulfa Antibiotics Nausea And Vomiting    Consultations:  orthopedics   Procedures/Studies: Pelvis Portable  Result Date: 10/13/2019 CLINICAL DATA:  Postoperative evaluation. EXAM: PORTABLE PELVIS 1-2 VIEWS COMPARISON:  None. FINDINGS: A radiopaque intramedullary rod and compression screw device are seen within the proximal right femur. A well-aligned acute fracture is seen just below the inter trochanteric region. There is no evidence of dislocation. No pelvic bone lesions are seen. IMPRESSION: Status post open reduction internal fixation of the proximal right femur. Electronically Signed   By:  Virgina Norfolk M.D.   On: 10/13/2019 18:00   DG Chest Port 1 View  Result Date: 10/12/2019 CLINICAL DATA:  Pt states she fell back and landed on floor earlier today. Pt c/o right leg pain EXAM: PORTABLE CHEST 1 VIEW COMPARISON:  Chest radiograph 02/08/2019 FINDINGS: Stable cardiomediastinal contours with aortic stent graft in place. Enlarged heart size. There are scattered bibasilar opacities likely atelectasis or scarring. No pneumothorax or large pleural effusion. No acute finding in the visualized skeleton. IMPRESSION: Scattered bibasilar opacities likely reflecting atelectasis or scarring. Electronically Signed   By: Audie Pinto M.D.   On: 10/12/2019 17:38   DG C-Arm 1-60 Min  Result Date: 10/13/2019 CLINICAL DATA:  Intraoperative right femoral fracture fixation. EXAM: DG C-ARM 1-60 MIN CONTRAST:  N/A FLUOROSCOPY TIME:  Fluoroscopy Time:  33 seconds Radiation Exposure Index (if provided by the fluoroscopic device):  4.4 mGy Number of Acquired Spot Images: 2 COMPARISON:  None. FINDINGS: A radiopaque intramedullary rod and compression screw device are seen within the proximal right femur. There is alignment of the fracture deformity involving the proximal right femoral shaft. There is no evidence of dislocation. IMPRESSION: Status post ORIF of a proximal right femoral shaft fracture. Electronically Signed   By: Virgina Norfolk M.D.   On: 10/13/2019 17:59   DG HIP OPERATIVE UNILAT W OR W/O PELVIS RIGHT  Result Date: 10/13/2019 CLINICAL DATA:  Intraoperative evaluation of proximal right femoral fracture fixation. EXAM: OPERATIVE RIGHT HIP (WITH PELVIS IF PERFORMED) 2 VIEWS TECHNIQUE: Fluoroscopic spot image(s) were submitted for interpretation post-operatively. COMPARISON:  None. FINDINGS: A radiopaque intramedullary rod and compression screw device are seen within the proximal right femur. The acute fracture of the proximal right femur is well aligned. Noted evidence of dislocation is seen.  Soft tissue structures are unremarkable. IMPRESSION: Status post ORIF of the proximal right femur. Electronically Signed   By: Virgina Norfolk M.D.   On: 10/13/2019 17:55   DG Hip Unilat With Pelvis 2-3 Views Right  Result Date: 10/12/2019 CLINICAL DATA:  [place] backwards landing on floor earlier today, RIGHT hip pain EXAM: DG HIP (WITH OR WITHOUT PELVIS) 2-3V RIGHT COMPARISON:  None FINDINGS: Osseous demineralization. SI joints poorly visualized. Mild narrowing of the hip joints bilaterally. Mildly displaced lesser trochanteric fracture. Lucency in the intertrochanteric region of the proximal RIGHT femur, suspicious for nondisplaced intertrochanteric fracture. A portion of the fracture plane is evident on the shoot through lateral view but the superolateral portion of the fracture line is not clearly delineated. No dislocation or pelvic fracture seen. IMPRESSION: Suspected nondisplaced intertrochanteric fracture of the RIGHT femur with a mildly displaced lesser trochanteric fracture fragment. The superolateral extent of the fracture is not well demonstrated; this could be better delineated by CT. Electronically Signed   By: Lavonia Dana M.D.   On: 10/12/2019 17:42   DG Femur Min 2 Views Right  Result Date: 10/12/2019 CLINICAL DATA:  Pt states she fell back and landed on floor earlier today. Pt c/o right leg pain EXAM: RIGHT FEMUR 2 VIEWS COMPARISON:  Right hip radiographs 10/12/2019 FINDINGS: There is again a mildly displaced lesser trochanteric fracture, similar in appearance to prior. Subtle lucency in the inter trochanteric region suspicious for nondisplaced fracture. No evidence of dislocation. No new acute finding in the right femur. Status post right knee arthroplasty. Alignment appears intact. There are vascular calcifications in the regional soft tissues. IMPRESSION: No significant changes in the right hip from earlier prior radiographs demonstrating a mildly displaced lesser trochanteric fracture  and lucency in the inter trochanteric region suspicious for nondisplaced fracture. Electronically Signed   By: Audie Pinto M.D.   On: 10/12/2019 20:13      Procedures: Intramedullary fixation, right femur.  Subjective: Patient is feeling better, her pain is better control and her po intake has improved, no nausea or vomiting no dyspnea or chest pain,.   Discharge Exam: Vitals:   10/17/19 0326 10/17/19 0827  BP: (!) 134/47 (!) 143/58  Pulse: 81 80  Resp:  15  Temp: 98.1 F (36.7 C) 97.9 F (36.6 C)  SpO2: 90% 91%   Vitals:   10/16/19 0536 10/16/19 2111 10/17/19 0326 10/17/19 0827  BP: (!) 130/46 (!) 120/46 (!) 134/47 (!) 143/58  Pulse: 76 85 81 80  Resp: 15   15  Temp: 97.7 F (36.5 C) 98 F (36.7 C) 98.1 F (36.7 C)  97.9 F (36.6 C)  TempSrc: Oral Oral Oral Oral  SpO2: (!) 86% 91% 90% 91%  Weight:      Height:        General: Not in pain or dyspnea.  Neurology: Awake and alert, non focal  E ENT: no pallor, no icterus, oral mucosa moist Cardiovascular: No JVD. S1-S2 present, rhythmic, no gallops, rubs, or murmurs. No lower extremity edema. Pulmonary: positive breath sounds bilaterally, adequate air movement, no wheezing, rhonchi or rales. Gastrointestinal. Abdomen soft and non tender Skin. No rashes Musculoskeletal: no joint deformities   The results of significant diagnostics from this hospitalization (including imaging, microbiology, ancillary and laboratory) are listed below for reference.     Microbiology: Recent Results (from the past 240 hour(s))  SARS Coronavirus 2 by RT PCR (hospital order, performed in [facility] hospital lab) Nasopharyngeal Nasopharyngeal Swab     Status: None   Collection Time: 10/12/19  6:57 PM   Specimen: Nasopharyngeal Swab  Result Value Ref Range Status   SARS Coronavirus 2 NEGATIVE NEGATIVE Final    Comment: (NOTE) SARS-CoV-2 target nucleic acids are NOT DETECTED.  The SARS-CoV-2 RNA is generally detectable in upper  and lower respiratory specimens during the acute phase of infection. The lowest concentration of SARS-CoV-2 viral copies this assay can detect is 250 copies / mL. A negative result does not preclude SARS-CoV-2 infection and should not be used as the sole basis for treatment or other patient management decisions.  A negative result may occur with improper specimen collection / handling, submission of specimen other than nasopharyngeal swab, presence of viral mutation(s) within the areas targeted by this assay, and inadequate number of viral copies (<250 copies / mL). A negative result must be combined with clinical observations, patient history, and epidemiological information.  Fact Sheet for Patients:   StrictlyIdeas.no  Fact Sheet for Healthcare Providers: BankingDealers.co.za  This test is not yet approved or  cleared by the [place] FDA and has been authorized for detection and/or diagnosis of SARS-CoV-2 by FDA under an Emergency Use Authorization (EUA).  This EUA will remain in effect (meaning this test can be used) for the duration of the COVID-19 declaration under Section 564(b)(1) of the Act, 21 U.S.C. section 360bbb-3(b)(1), unless the authorization is terminated or revoked sooner.  Performed at [facility], [address] [place], [place] [address]      Labs: BNP (last 3 results) No results for input(s): BNP in the last 8760 hours. Basic Metabolic Panel: Recent Labs  Lab 10/13/19 0959 10/14/19 0444 10/15/19 0259 10/16/19 0602 10/17/19 0325  NA 135 135 136 135 134*  K 4.9 4.8 5.3* 4.3 4.3  CL 104 100 99 98 100  CO2 23 27 24 26 25   GLUCOSE 175* 192* 209* 174* 200*  BUN 30* 27* 31* 34* 38*  CREATININE 1.83* 1.99* 2.09* 2.49* 2.36*  CALCIUM 8.3* 8.4* 8.8* 9.2 8.9  MG 1.7  --  2.3  --   --    Liver Function Tests: Recent Labs  Lab 10/13/19 0959  AST 19  ALT 14  ALKPHOS 78  BILITOT 1.1  PROT 6.6   ALBUMIN 3.1*   No results for input(s): LIPASE, AMYLASE in the last 168 hours. No results for input(s): AMMONIA in the last 168 hours. CBC: Recent Labs  Lab 10/12/19 1712 10/13/19 0959 10/14/19 0444 10/15/19 0551 10/16/19 0602  WBC 5.6 5.6 6.9 7.2 7.0  NEUTROABS 2.9 3.4 4.4  --   --   HGB 8.2* 8.1*  8.3* 8.8* 8.1*  HCT 30.3* 30.5* 30.2* 30.3* 29.1*  MCV 70.1* 71.6* 72.4* 72.8* 72.4*  PLT 159 128* 136* 141* 168   Cardiac Enzymes: No results for input(s): CKTOTAL, CKMB, CKMBINDEX, TROPONINI in the last 168 hours. BNP: Invalid input(s): POCBNP CBG: Recent Labs  Lab 10/16/19 0637 10/16/19 1133 10/16/19 1639 10/16/19 2102 10/17/19 0632  GLUCAP 160* 154* 224* 204* 184*   D-Dimer No results for input(s): DDIMER in the last 72 hours. Hgb A1c No results for input(s): HGBA1C in the last 72 hours. Lipid Profile No results for input(s): CHOL, HDL, LDLCALC, TRIG, CHOLHDL, LDLDIRECT in the last 72 hours. Thyroid function studies No results for input(s): TSH, T4TOTAL, T3FREE, THYROIDAB in the last 72 hours.  Invalid input(s): FREET3 Anemia work up No results for input(s): VITAMINB12, FOLATE, FERRITIN, TIBC, IRON, RETICCTPCT in the last 72 hours. Urinalysis No results found for: COLORURINE, APPEARANCEUR, [place], [place], [place], [place], [place], [place], PROTEINUR, UROBILINOGEN, NITRITE, LEUKOCYTESUR Sepsis Labs Invalid input(s): PROCALCITONIN,  WBC,  LACTICIDVEN Microbiology Recent Results (from the past 240 hour(s))  SARS Coronavirus 2 by RT PCR (hospital order, performed in [facility] hospital lab) Nasopharyngeal Nasopharyngeal Swab     Status: None   Collection Time: 10/12/19  6:57 PM   Specimen: Nasopharyngeal Swab  Result Value Ref Range Status   SARS Coronavirus 2 NEGATIVE NEGATIVE Final    Comment: (NOTE) SARS-CoV-2 target nucleic acids are NOT DETECTED.  The SARS-CoV-2 RNA is generally detectable in upper and lower respiratory specimens during the acute  phase of infection. The lowest concentration of SARS-CoV-2 viral copies this assay can detect is 250 copies / mL. A negative result does not preclude SARS-CoV-2 infection and should not be used as the sole basis for treatment or other patient management decisions.  A negative result may occur with improper specimen collection / handling, submission of specimen other than nasopharyngeal swab, presence of viral mutation(s) within the areas targeted by this assay, and inadequate number of viral copies (<250 copies / mL). A negative result must be combined with clinical observations, patient history, and epidemiological information.  Fact Sheet for Patients:   StrictlyIdeas.no  Fact Sheet for Healthcare Providers: BankingDealers.co.za  This test is not yet approved or  cleared by the [place] FDA and has been authorized for detection and/or diagnosis of SARS-CoV-2 by FDA under an Emergency Use Authorization (EUA).  This EUA will remain in effect (meaning this test can be used) for the duration of the COVID-19 declaration under Section 564(b)(1) of the Act, 21 U.S.C. section 360bbb-3(b)(1), unless the authorization is terminated or revoked sooner.  Performed at [facility], [address] [place], [place] [address]      Time coordinating discharge: 45 minutes  SIGNED:   Tawni Millers, MD  [facility] 10/17/2019, 8:42 AM

## 2019-10-17 NOTE — TOC Transition Note (Addendum)
Transition of Care [facility]) - CM/SW Discharge Note   Patient Details  Name: Catherine Rich MRN: [id number] Date of Birth: 02-Feb-1929  Transition of Care [facility]) CM/SW Contact:  Sharin Mons, RN Phone Number: 10/17/2019, 11:35 AM   Clinical Narrative:    Patient will DC to: [facility]. Anticipated DC date: 10/17/2019 Family notified: daughter Transport by: Corey Harold   Per MD patient ready for DC today . RN, patient, patient's family, and facility notified of DC. Discharge Summary and FL2 sent to facility. RN to call report prior discharge ([phone]). DC packet on chart. Ambulance transport requested for patient., time pick up for 1pm.  Junie Spencer (Daughter)       [phone]           RNCM will sign off for now as intervention is no longer needed. Please consult [place] again if new needs arise.    Final next level of care: [place] Barriers to Discharge: No Barriers Identified   Patient Goals and CMS Choice Patient states their goals for this hospitalization and ongoing recovery are:: to go to skilled nursing facility for rehab. CMS Medicare.gov Compare Post Acute Care list provided to:: Patient Choice offered to / list presented to : Patient  Discharge Placement                       Discharge Plan and Services In-house Referral: NA Discharge Planning Services: CM Consult Post Acute Care Choice: [place]          DME Arranged: N/A                    Social Determinants of Health (SDOH) Interventions     Readmission Risk Interventions No flowsheet data found.

## 2019-10-18 DIAGNOSIS — E1122 Type 2 diabetes mellitus with diabetic chronic kidney disease: Secondary | ICD-10-CM | POA: Diagnosis not present

## 2019-10-18 DIAGNOSIS — S72001D Fracture of unspecified part of neck of right femur, subsequent encounter for closed fracture with routine healing: Secondary | ICD-10-CM | POA: Diagnosis not present

## 2019-11-05 DIAGNOSIS — S72141D Displaced intertrochanteric fracture of right femur, subsequent encounter for closed fracture with routine healing: Secondary | ICD-10-CM | POA: Diagnosis not present

## 2019-11-21 DIAGNOSIS — K219 Gastro-esophageal reflux disease without esophagitis: Secondary | ICD-10-CM | POA: Diagnosis not present

## 2019-11-21 DIAGNOSIS — H3530 Unspecified macular degeneration: Secondary | ICD-10-CM | POA: Diagnosis not present

## 2019-11-21 DIAGNOSIS — I5032 Chronic diastolic (congestive) heart failure: Secondary | ICD-10-CM | POA: Diagnosis not present

## 2019-11-21 DIAGNOSIS — N184 Chronic kidney disease, stage 4 (severe): Secondary | ICD-10-CM | POA: Diagnosis not present

## 2019-11-21 DIAGNOSIS — I2510 Atherosclerotic heart disease of native coronary artery without angina pectoris: Secondary | ICD-10-CM | POA: Diagnosis not present

## 2019-11-21 DIAGNOSIS — W19XXXD Unspecified fall, subsequent encounter: Secondary | ICD-10-CM | POA: Diagnosis not present

## 2019-11-21 DIAGNOSIS — D631 Anemia in chronic kidney disease: Secondary | ICD-10-CM | POA: Diagnosis not present

## 2019-11-21 DIAGNOSIS — M5136 Other intervertebral disc degeneration, lumbar region: Secondary | ICD-10-CM | POA: Diagnosis not present

## 2019-11-21 DIAGNOSIS — E1142 Type 2 diabetes mellitus with diabetic polyneuropathy: Secondary | ICD-10-CM | POA: Diagnosis not present

## 2019-11-21 DIAGNOSIS — Z9181 History of falling: Secondary | ICD-10-CM | POA: Diagnosis not present

## 2019-11-21 DIAGNOSIS — Z8673 Personal history of transient ischemic attack (TIA), and cerebral infarction without residual deficits: Secondary | ICD-10-CM | POA: Diagnosis not present

## 2019-11-21 DIAGNOSIS — M1990 Unspecified osteoarthritis, unspecified site: Secondary | ICD-10-CM | POA: Diagnosis not present

## 2019-11-21 DIAGNOSIS — S72144D Nondisplaced intertrochanteric fracture of right femur, subsequent encounter for closed fracture with routine healing: Secondary | ICD-10-CM | POA: Diagnosis not present

## 2019-11-21 DIAGNOSIS — J9611 Chronic respiratory failure with hypoxia: Secondary | ICD-10-CM | POA: Diagnosis not present

## 2019-11-21 DIAGNOSIS — I130 Hypertensive heart and chronic kidney disease with heart failure and stage 1 through stage 4 chronic kidney disease, or unspecified chronic kidney disease: Secondary | ICD-10-CM | POA: Diagnosis not present

## 2019-11-21 DIAGNOSIS — J449 Chronic obstructive pulmonary disease, unspecified: Secondary | ICD-10-CM | POA: Diagnosis not present

## 2019-11-21 DIAGNOSIS — E785 Hyperlipidemia, unspecified: Secondary | ICD-10-CM | POA: Diagnosis not present

## 2019-11-21 DIAGNOSIS — Z7982 Long term (current) use of aspirin: Secondary | ICD-10-CM | POA: Diagnosis not present

## 2019-11-21 DIAGNOSIS — F411 Generalized anxiety disorder: Secondary | ICD-10-CM | POA: Diagnosis not present

## 2019-11-21 DIAGNOSIS — Z9981 Dependence on supplemental oxygen: Secondary | ICD-10-CM | POA: Diagnosis not present

## 2019-11-21 DIAGNOSIS — E039 Hypothyroidism, unspecified: Secondary | ICD-10-CM | POA: Diagnosis not present

## 2019-11-21 DIAGNOSIS — N179 Acute kidney failure, unspecified: Secondary | ICD-10-CM | POA: Diagnosis not present

## 2019-11-21 DIAGNOSIS — S72145D Nondisplaced intertrochanteric fracture of left femur, subsequent encounter for closed fracture with routine healing: Secondary | ICD-10-CM | POA: Diagnosis not present

## 2019-11-21 DIAGNOSIS — E1122 Type 2 diabetes mellitus with diabetic chronic kidney disease: Secondary | ICD-10-CM | POA: Diagnosis not present

## 2019-11-25 DIAGNOSIS — J449 Chronic obstructive pulmonary disease, unspecified: Secondary | ICD-10-CM | POA: Diagnosis not present

## 2019-11-25 DIAGNOSIS — J9611 Chronic respiratory failure with hypoxia: Secondary | ICD-10-CM | POA: Diagnosis not present

## 2019-11-25 DIAGNOSIS — S72145D Nondisplaced intertrochanteric fracture of left femur, subsequent encounter for closed fracture with routine healing: Secondary | ICD-10-CM | POA: Diagnosis not present

## 2019-11-25 DIAGNOSIS — W19XXXD Unspecified fall, subsequent encounter: Secondary | ICD-10-CM | POA: Diagnosis not present

## 2019-11-25 DIAGNOSIS — I130 Hypertensive heart and chronic kidney disease with heart failure and stage 1 through stage 4 chronic kidney disease, or unspecified chronic kidney disease: Secondary | ICD-10-CM | POA: Diagnosis not present

## 2019-11-25 DIAGNOSIS — N179 Acute kidney failure, unspecified: Secondary | ICD-10-CM | POA: Diagnosis not present

## 2019-11-27 DIAGNOSIS — S72145D Nondisplaced intertrochanteric fracture of left femur, subsequent encounter for closed fracture with routine healing: Secondary | ICD-10-CM | POA: Diagnosis not present

## 2019-11-27 DIAGNOSIS — I130 Hypertensive heart and chronic kidney disease with heart failure and stage 1 through stage 4 chronic kidney disease, or unspecified chronic kidney disease: Secondary | ICD-10-CM | POA: Diagnosis not present

## 2019-11-27 DIAGNOSIS — J9611 Chronic respiratory failure with hypoxia: Secondary | ICD-10-CM | POA: Diagnosis not present

## 2019-11-27 DIAGNOSIS — J449 Chronic obstructive pulmonary disease, unspecified: Secondary | ICD-10-CM | POA: Diagnosis not present

## 2019-11-27 DIAGNOSIS — N179 Acute kidney failure, unspecified: Secondary | ICD-10-CM | POA: Diagnosis not present

## 2019-11-27 DIAGNOSIS — R4582 Worries: Secondary | ICD-10-CM | POA: Diagnosis not present

## 2019-11-27 DIAGNOSIS — Z23 Encounter for immunization: Secondary | ICD-10-CM | POA: Diagnosis not present

## 2019-11-27 DIAGNOSIS — W19XXXD Unspecified fall, subsequent encounter: Secondary | ICD-10-CM | POA: Diagnosis not present

## 2019-11-28 DIAGNOSIS — I130 Hypertensive heart and chronic kidney disease with heart failure and stage 1 through stage 4 chronic kidney disease, or unspecified chronic kidney disease: Secondary | ICD-10-CM | POA: Diagnosis not present

## 2019-11-28 DIAGNOSIS — J449 Chronic obstructive pulmonary disease, unspecified: Secondary | ICD-10-CM | POA: Diagnosis not present

## 2019-11-28 DIAGNOSIS — N179 Acute kidney failure, unspecified: Secondary | ICD-10-CM | POA: Diagnosis not present

## 2019-11-28 DIAGNOSIS — S72145D Nondisplaced intertrochanteric fracture of left femur, subsequent encounter for closed fracture with routine healing: Secondary | ICD-10-CM | POA: Diagnosis not present

## 2019-11-28 DIAGNOSIS — W19XXXD Unspecified fall, subsequent encounter: Secondary | ICD-10-CM | POA: Diagnosis not present

## 2019-11-28 DIAGNOSIS — J9611 Chronic respiratory failure with hypoxia: Secondary | ICD-10-CM | POA: Diagnosis not present

## 2019-12-02 DIAGNOSIS — S72141D Displaced intertrochanteric fracture of right femur, subsequent encounter for closed fracture with routine healing: Secondary | ICD-10-CM | POA: Diagnosis not present

## 2019-12-03 DIAGNOSIS — N179 Acute kidney failure, unspecified: Secondary | ICD-10-CM | POA: Diagnosis not present

## 2019-12-03 DIAGNOSIS — I130 Hypertensive heart and chronic kidney disease with heart failure and stage 1 through stage 4 chronic kidney disease, or unspecified chronic kidney disease: Secondary | ICD-10-CM | POA: Diagnosis not present

## 2019-12-03 DIAGNOSIS — J449 Chronic obstructive pulmonary disease, unspecified: Secondary | ICD-10-CM | POA: Diagnosis not present

## 2019-12-03 DIAGNOSIS — W19XXXD Unspecified fall, subsequent encounter: Secondary | ICD-10-CM | POA: Diagnosis not present

## 2019-12-03 DIAGNOSIS — J9611 Chronic respiratory failure with hypoxia: Secondary | ICD-10-CM | POA: Diagnosis not present

## 2019-12-03 DIAGNOSIS — S72145D Nondisplaced intertrochanteric fracture of left femur, subsequent encounter for closed fracture with routine healing: Secondary | ICD-10-CM | POA: Diagnosis not present

## 2019-12-04 DIAGNOSIS — J9611 Chronic respiratory failure with hypoxia: Secondary | ICD-10-CM | POA: Diagnosis not present

## 2019-12-04 DIAGNOSIS — S72145D Nondisplaced intertrochanteric fracture of left femur, subsequent encounter for closed fracture with routine healing: Secondary | ICD-10-CM | POA: Diagnosis not present

## 2019-12-04 DIAGNOSIS — N179 Acute kidney failure, unspecified: Secondary | ICD-10-CM | POA: Diagnosis not present

## 2019-12-04 DIAGNOSIS — J449 Chronic obstructive pulmonary disease, unspecified: Secondary | ICD-10-CM | POA: Diagnosis not present

## 2019-12-04 DIAGNOSIS — I130 Hypertensive heart and chronic kidney disease with heart failure and stage 1 through stage 4 chronic kidney disease, or unspecified chronic kidney disease: Secondary | ICD-10-CM | POA: Diagnosis not present

## 2019-12-04 DIAGNOSIS — W19XXXD Unspecified fall, subsequent encounter: Secondary | ICD-10-CM | POA: Diagnosis not present

## 2019-12-05 DIAGNOSIS — J9611 Chronic respiratory failure with hypoxia: Secondary | ICD-10-CM | POA: Diagnosis not present

## 2019-12-05 DIAGNOSIS — J449 Chronic obstructive pulmonary disease, unspecified: Secondary | ICD-10-CM | POA: Diagnosis not present

## 2019-12-05 DIAGNOSIS — I130 Hypertensive heart and chronic kidney disease with heart failure and stage 1 through stage 4 chronic kidney disease, or unspecified chronic kidney disease: Secondary | ICD-10-CM | POA: Diagnosis not present

## 2019-12-05 DIAGNOSIS — N179 Acute kidney failure, unspecified: Secondary | ICD-10-CM | POA: Diagnosis not present

## 2019-12-05 DIAGNOSIS — W19XXXD Unspecified fall, subsequent encounter: Secondary | ICD-10-CM | POA: Diagnosis not present

## 2019-12-05 DIAGNOSIS — S72145D Nondisplaced intertrochanteric fracture of left femur, subsequent encounter for closed fracture with routine healing: Secondary | ICD-10-CM | POA: Diagnosis not present

## 2019-12-08 DIAGNOSIS — J449 Chronic obstructive pulmonary disease, unspecified: Secondary | ICD-10-CM | POA: Diagnosis not present

## 2019-12-08 DIAGNOSIS — N179 Acute kidney failure, unspecified: Secondary | ICD-10-CM | POA: Diagnosis not present

## 2019-12-08 DIAGNOSIS — J9611 Chronic respiratory failure with hypoxia: Secondary | ICD-10-CM | POA: Diagnosis not present

## 2019-12-08 DIAGNOSIS — I130 Hypertensive heart and chronic kidney disease with heart failure and stage 1 through stage 4 chronic kidney disease, or unspecified chronic kidney disease: Secondary | ICD-10-CM | POA: Diagnosis not present

## 2019-12-08 DIAGNOSIS — S72145D Nondisplaced intertrochanteric fracture of left femur, subsequent encounter for closed fracture with routine healing: Secondary | ICD-10-CM | POA: Diagnosis not present

## 2019-12-08 DIAGNOSIS — W19XXXD Unspecified fall, subsequent encounter: Secondary | ICD-10-CM | POA: Diagnosis not present

## 2019-12-09 DIAGNOSIS — J9611 Chronic respiratory failure with hypoxia: Secondary | ICD-10-CM | POA: Diagnosis not present

## 2019-12-09 DIAGNOSIS — W19XXXD Unspecified fall, subsequent encounter: Secondary | ICD-10-CM | POA: Diagnosis not present

## 2019-12-09 DIAGNOSIS — I130 Hypertensive heart and chronic kidney disease with heart failure and stage 1 through stage 4 chronic kidney disease, or unspecified chronic kidney disease: Secondary | ICD-10-CM | POA: Diagnosis not present

## 2019-12-09 DIAGNOSIS — J449 Chronic obstructive pulmonary disease, unspecified: Secondary | ICD-10-CM | POA: Diagnosis not present

## 2019-12-09 DIAGNOSIS — S72145D Nondisplaced intertrochanteric fracture of left femur, subsequent encounter for closed fracture with routine healing: Secondary | ICD-10-CM | POA: Diagnosis not present

## 2019-12-09 DIAGNOSIS — N179 Acute kidney failure, unspecified: Secondary | ICD-10-CM | POA: Diagnosis not present

## 2019-12-11 DIAGNOSIS — J449 Chronic obstructive pulmonary disease, unspecified: Secondary | ICD-10-CM | POA: Diagnosis not present

## 2019-12-11 DIAGNOSIS — W19XXXD Unspecified fall, subsequent encounter: Secondary | ICD-10-CM | POA: Diagnosis not present

## 2019-12-11 DIAGNOSIS — J9611 Chronic respiratory failure with hypoxia: Secondary | ICD-10-CM | POA: Diagnosis not present

## 2019-12-11 DIAGNOSIS — S72145D Nondisplaced intertrochanteric fracture of left femur, subsequent encounter for closed fracture with routine healing: Secondary | ICD-10-CM | POA: Diagnosis not present

## 2019-12-11 DIAGNOSIS — N179 Acute kidney failure, unspecified: Secondary | ICD-10-CM | POA: Diagnosis not present

## 2019-12-11 DIAGNOSIS — I130 Hypertensive heart and chronic kidney disease with heart failure and stage 1 through stage 4 chronic kidney disease, or unspecified chronic kidney disease: Secondary | ICD-10-CM | POA: Diagnosis not present

## 2019-12-15 DIAGNOSIS — H353132 Nonexudative age-related macular degeneration, bilateral, intermediate dry stage: Secondary | ICD-10-CM | POA: Diagnosis not present

## 2019-12-15 DIAGNOSIS — H18593 Other hereditary corneal dystrophies, bilateral: Secondary | ICD-10-CM | POA: Diagnosis not present

## 2019-12-16 DIAGNOSIS — J449 Chronic obstructive pulmonary disease, unspecified: Secondary | ICD-10-CM | POA: Diagnosis not present

## 2019-12-16 DIAGNOSIS — N179 Acute kidney failure, unspecified: Secondary | ICD-10-CM | POA: Diagnosis not present

## 2019-12-16 DIAGNOSIS — W19XXXD Unspecified fall, subsequent encounter: Secondary | ICD-10-CM | POA: Diagnosis not present

## 2019-12-16 DIAGNOSIS — S72145D Nondisplaced intertrochanteric fracture of left femur, subsequent encounter for closed fracture with routine healing: Secondary | ICD-10-CM | POA: Diagnosis not present

## 2019-12-16 DIAGNOSIS — J9611 Chronic respiratory failure with hypoxia: Secondary | ICD-10-CM | POA: Diagnosis not present

## 2019-12-16 DIAGNOSIS — I130 Hypertensive heart and chronic kidney disease with heart failure and stage 1 through stage 4 chronic kidney disease, or unspecified chronic kidney disease: Secondary | ICD-10-CM | POA: Diagnosis not present

## 2019-12-17 DIAGNOSIS — N189 Chronic kidney disease, unspecified: Secondary | ICD-10-CM | POA: Diagnosis not present

## 2019-12-17 DIAGNOSIS — E782 Mixed hyperlipidemia: Secondary | ICD-10-CM | POA: Diagnosis not present

## 2019-12-17 DIAGNOSIS — D519 Vitamin B12 deficiency anemia, unspecified: Secondary | ICD-10-CM | POA: Diagnosis not present

## 2019-12-17 DIAGNOSIS — L0100 Impetigo, unspecified: Secondary | ICD-10-CM | POA: Diagnosis not present

## 2019-12-17 DIAGNOSIS — L309 Dermatitis, unspecified: Secondary | ICD-10-CM | POA: Diagnosis not present

## 2019-12-17 DIAGNOSIS — E1122 Type 2 diabetes mellitus with diabetic chronic kidney disease: Secondary | ICD-10-CM | POA: Diagnosis not present

## 2019-12-17 DIAGNOSIS — D529 Folate deficiency anemia, unspecified: Secondary | ICD-10-CM | POA: Diagnosis not present

## 2019-12-18 DIAGNOSIS — J9611 Chronic respiratory failure with hypoxia: Secondary | ICD-10-CM | POA: Diagnosis not present

## 2019-12-18 DIAGNOSIS — S72145D Nondisplaced intertrochanteric fracture of left femur, subsequent encounter for closed fracture with routine healing: Secondary | ICD-10-CM | POA: Diagnosis not present

## 2019-12-18 DIAGNOSIS — N179 Acute kidney failure, unspecified: Secondary | ICD-10-CM | POA: Diagnosis not present

## 2019-12-18 DIAGNOSIS — J449 Chronic obstructive pulmonary disease, unspecified: Secondary | ICD-10-CM | POA: Diagnosis not present

## 2019-12-18 DIAGNOSIS — I130 Hypertensive heart and chronic kidney disease with heart failure and stage 1 through stage 4 chronic kidney disease, or unspecified chronic kidney disease: Secondary | ICD-10-CM | POA: Diagnosis not present

## 2019-12-18 DIAGNOSIS — W19XXXD Unspecified fall, subsequent encounter: Secondary | ICD-10-CM | POA: Diagnosis not present

## 2019-12-20 DIAGNOSIS — Z23 Encounter for immunization: Secondary | ICD-10-CM | POA: Diagnosis not present

## 2019-12-20 DIAGNOSIS — N184 Chronic kidney disease, stage 4 (severe): Secondary | ICD-10-CM | POA: Diagnosis not present

## 2019-12-20 DIAGNOSIS — S72001A Fracture of unspecified part of neck of right femur, initial encounter for closed fracture: Secondary | ICD-10-CM | POA: Diagnosis not present

## 2019-12-21 DIAGNOSIS — K219 Gastro-esophageal reflux disease without esophagitis: Secondary | ICD-10-CM | POA: Diagnosis not present

## 2019-12-21 DIAGNOSIS — S72145D Nondisplaced intertrochanteric fracture of left femur, subsequent encounter for closed fracture with routine healing: Secondary | ICD-10-CM | POA: Diagnosis not present

## 2019-12-21 DIAGNOSIS — J9611 Chronic respiratory failure with hypoxia: Secondary | ICD-10-CM | POA: Diagnosis not present

## 2019-12-21 DIAGNOSIS — F411 Generalized anxiety disorder: Secondary | ICD-10-CM | POA: Diagnosis not present

## 2019-12-21 DIAGNOSIS — H3530 Unspecified macular degeneration: Secondary | ICD-10-CM | POA: Diagnosis not present

## 2019-12-21 DIAGNOSIS — I2510 Atherosclerotic heart disease of native coronary artery without angina pectoris: Secondary | ICD-10-CM | POA: Diagnosis not present

## 2019-12-21 DIAGNOSIS — Z7982 Long term (current) use of aspirin: Secondary | ICD-10-CM | POA: Diagnosis not present

## 2019-12-21 DIAGNOSIS — Z8673 Personal history of transient ischemic attack (TIA), and cerebral infarction without residual deficits: Secondary | ICD-10-CM | POA: Diagnosis not present

## 2019-12-21 DIAGNOSIS — E1142 Type 2 diabetes mellitus with diabetic polyneuropathy: Secondary | ICD-10-CM | POA: Diagnosis not present

## 2019-12-21 DIAGNOSIS — Z9981 Dependence on supplemental oxygen: Secondary | ICD-10-CM | POA: Diagnosis not present

## 2019-12-21 DIAGNOSIS — Z9181 History of falling: Secondary | ICD-10-CM | POA: Diagnosis not present

## 2019-12-21 DIAGNOSIS — M1990 Unspecified osteoarthritis, unspecified site: Secondary | ICD-10-CM | POA: Diagnosis not present

## 2019-12-21 DIAGNOSIS — D631 Anemia in chronic kidney disease: Secondary | ICD-10-CM | POA: Diagnosis not present

## 2019-12-21 DIAGNOSIS — I5032 Chronic diastolic (congestive) heart failure: Secondary | ICD-10-CM | POA: Diagnosis not present

## 2019-12-21 DIAGNOSIS — E1122 Type 2 diabetes mellitus with diabetic chronic kidney disease: Secondary | ICD-10-CM | POA: Diagnosis not present

## 2019-12-21 DIAGNOSIS — I130 Hypertensive heart and chronic kidney disease with heart failure and stage 1 through stage 4 chronic kidney disease, or unspecified chronic kidney disease: Secondary | ICD-10-CM | POA: Diagnosis not present

## 2019-12-21 DIAGNOSIS — E039 Hypothyroidism, unspecified: Secondary | ICD-10-CM | POA: Diagnosis not present

## 2019-12-21 DIAGNOSIS — E785 Hyperlipidemia, unspecified: Secondary | ICD-10-CM | POA: Diagnosis not present

## 2019-12-21 DIAGNOSIS — N184 Chronic kidney disease, stage 4 (severe): Secondary | ICD-10-CM | POA: Diagnosis not present

## 2019-12-21 DIAGNOSIS — N179 Acute kidney failure, unspecified: Secondary | ICD-10-CM | POA: Diagnosis not present

## 2019-12-21 DIAGNOSIS — W19XXXD Unspecified fall, subsequent encounter: Secondary | ICD-10-CM | POA: Diagnosis not present

## 2019-12-21 DIAGNOSIS — J449 Chronic obstructive pulmonary disease, unspecified: Secondary | ICD-10-CM | POA: Diagnosis not present

## 2019-12-21 DIAGNOSIS — M5136 Other intervertebral disc degeneration, lumbar region: Secondary | ICD-10-CM | POA: Diagnosis not present

## 2019-12-23 DIAGNOSIS — J449 Chronic obstructive pulmonary disease, unspecified: Secondary | ICD-10-CM | POA: Diagnosis not present

## 2019-12-23 DIAGNOSIS — I10 Essential (primary) hypertension: Secondary | ICD-10-CM | POA: Diagnosis not present

## 2019-12-23 DIAGNOSIS — E039 Hypothyroidism, unspecified: Secondary | ICD-10-CM | POA: Diagnosis not present

## 2019-12-23 DIAGNOSIS — E1122 Type 2 diabetes mellitus with diabetic chronic kidney disease: Secondary | ICD-10-CM | POA: Diagnosis not present

## 2019-12-23 DIAGNOSIS — I714 Abdominal aortic aneurysm, without rupture: Secondary | ICD-10-CM | POA: Diagnosis not present

## 2019-12-23 DIAGNOSIS — R4582 Worries: Secondary | ICD-10-CM | POA: Diagnosis not present

## 2019-12-23 DIAGNOSIS — J9611 Chronic respiratory failure with hypoxia: Secondary | ICD-10-CM | POA: Diagnosis not present

## 2019-12-23 DIAGNOSIS — D500 Iron deficiency anemia secondary to blood loss (chronic): Secondary | ICD-10-CM | POA: Diagnosis not present

## 2019-12-24 DIAGNOSIS — Z23 Encounter for immunization: Secondary | ICD-10-CM | POA: Diagnosis not present

## 2019-12-24 DIAGNOSIS — S72145D Nondisplaced intertrochanteric fracture of left femur, subsequent encounter for closed fracture with routine healing: Secondary | ICD-10-CM | POA: Diagnosis not present

## 2019-12-24 DIAGNOSIS — J449 Chronic obstructive pulmonary disease, unspecified: Secondary | ICD-10-CM | POA: Diagnosis not present

## 2019-12-24 DIAGNOSIS — I130 Hypertensive heart and chronic kidney disease with heart failure and stage 1 through stage 4 chronic kidney disease, or unspecified chronic kidney disease: Secondary | ICD-10-CM | POA: Diagnosis not present

## 2019-12-24 DIAGNOSIS — J9611 Chronic respiratory failure with hypoxia: Secondary | ICD-10-CM | POA: Diagnosis not present

## 2019-12-24 DIAGNOSIS — N179 Acute kidney failure, unspecified: Secondary | ICD-10-CM | POA: Diagnosis not present

## 2019-12-24 DIAGNOSIS — W19XXXD Unspecified fall, subsequent encounter: Secondary | ICD-10-CM | POA: Diagnosis not present

## 2020-01-02 DIAGNOSIS — S72145D Nondisplaced intertrochanteric fracture of left femur, subsequent encounter for closed fracture with routine healing: Secondary | ICD-10-CM | POA: Diagnosis not present

## 2020-01-02 DIAGNOSIS — J9611 Chronic respiratory failure with hypoxia: Secondary | ICD-10-CM | POA: Diagnosis not present

## 2020-01-02 DIAGNOSIS — W19XXXD Unspecified fall, subsequent encounter: Secondary | ICD-10-CM | POA: Diagnosis not present

## 2020-01-02 DIAGNOSIS — N179 Acute kidney failure, unspecified: Secondary | ICD-10-CM | POA: Diagnosis not present

## 2020-01-02 DIAGNOSIS — J449 Chronic obstructive pulmonary disease, unspecified: Secondary | ICD-10-CM | POA: Diagnosis not present

## 2020-01-02 DIAGNOSIS — I130 Hypertensive heart and chronic kidney disease with heart failure and stage 1 through stage 4 chronic kidney disease, or unspecified chronic kidney disease: Secondary | ICD-10-CM | POA: Diagnosis not present

## 2020-01-06 DIAGNOSIS — I130 Hypertensive heart and chronic kidney disease with heart failure and stage 1 through stage 4 chronic kidney disease, or unspecified chronic kidney disease: Secondary | ICD-10-CM | POA: Diagnosis not present

## 2020-01-06 DIAGNOSIS — N179 Acute kidney failure, unspecified: Secondary | ICD-10-CM | POA: Diagnosis not present

## 2020-01-06 DIAGNOSIS — J9611 Chronic respiratory failure with hypoxia: Secondary | ICD-10-CM | POA: Diagnosis not present

## 2020-01-06 DIAGNOSIS — J449 Chronic obstructive pulmonary disease, unspecified: Secondary | ICD-10-CM | POA: Diagnosis not present

## 2020-01-06 DIAGNOSIS — S72145D Nondisplaced intertrochanteric fracture of left femur, subsequent encounter for closed fracture with routine healing: Secondary | ICD-10-CM | POA: Diagnosis not present

## 2020-01-06 DIAGNOSIS — W19XXXD Unspecified fall, subsequent encounter: Secondary | ICD-10-CM | POA: Diagnosis not present

## 2020-01-13 DIAGNOSIS — S72141D Displaced intertrochanteric fracture of right femur, subsequent encounter for closed fracture with routine healing: Secondary | ICD-10-CM | POA: Diagnosis not present

## 2020-01-14 DIAGNOSIS — H353221 Exudative age-related macular degeneration, left eye, with active choroidal neovascularization: Secondary | ICD-10-CM | POA: Diagnosis not present

## 2020-01-14 DIAGNOSIS — H353211 Exudative age-related macular degeneration, right eye, with active choroidal neovascularization: Secondary | ICD-10-CM | POA: Diagnosis not present

## 2020-01-16 DIAGNOSIS — S72145D Nondisplaced intertrochanteric fracture of left femur, subsequent encounter for closed fracture with routine healing: Secondary | ICD-10-CM | POA: Diagnosis not present

## 2020-01-16 DIAGNOSIS — W19XXXD Unspecified fall, subsequent encounter: Secondary | ICD-10-CM | POA: Diagnosis not present

## 2020-01-16 DIAGNOSIS — J9611 Chronic respiratory failure with hypoxia: Secondary | ICD-10-CM | POA: Diagnosis not present

## 2020-01-16 DIAGNOSIS — I130 Hypertensive heart and chronic kidney disease with heart failure and stage 1 through stage 4 chronic kidney disease, or unspecified chronic kidney disease: Secondary | ICD-10-CM | POA: Diagnosis not present

## 2020-01-16 DIAGNOSIS — N179 Acute kidney failure, unspecified: Secondary | ICD-10-CM | POA: Diagnosis not present

## 2020-01-16 DIAGNOSIS — J449 Chronic obstructive pulmonary disease, unspecified: Secondary | ICD-10-CM | POA: Diagnosis not present

## 2020-01-20 DIAGNOSIS — S72141D Displaced intertrochanteric fracture of right femur, subsequent encounter for closed fracture with routine healing: Secondary | ICD-10-CM | POA: Diagnosis not present

## 2020-01-20 DIAGNOSIS — R2689 Other abnormalities of gait and mobility: Secondary | ICD-10-CM | POA: Diagnosis not present

## 2020-01-20 DIAGNOSIS — M6281 Muscle weakness (generalized): Secondary | ICD-10-CM | POA: Diagnosis not present

## 2020-01-20 DIAGNOSIS — M79604 Pain in right leg: Secondary | ICD-10-CM | POA: Diagnosis not present

## 2020-01-21 DIAGNOSIS — H353221 Exudative age-related macular degeneration, left eye, with active choroidal neovascularization: Secondary | ICD-10-CM | POA: Diagnosis not present

## 2020-01-23 DIAGNOSIS — R2689 Other abnormalities of gait and mobility: Secondary | ICD-10-CM | POA: Diagnosis not present

## 2020-01-23 DIAGNOSIS — M79604 Pain in right leg: Secondary | ICD-10-CM | POA: Diagnosis not present

## 2020-01-23 DIAGNOSIS — S72141D Displaced intertrochanteric fracture of right femur, subsequent encounter for closed fracture with routine healing: Secondary | ICD-10-CM | POA: Diagnosis not present

## 2020-01-23 DIAGNOSIS — M6281 Muscle weakness (generalized): Secondary | ICD-10-CM | POA: Diagnosis not present

## 2020-01-26 DIAGNOSIS — M79604 Pain in right leg: Secondary | ICD-10-CM | POA: Diagnosis not present

## 2020-01-26 DIAGNOSIS — M6281 Muscle weakness (generalized): Secondary | ICD-10-CM | POA: Diagnosis not present

## 2020-01-26 DIAGNOSIS — S72141D Displaced intertrochanteric fracture of right femur, subsequent encounter for closed fracture with routine healing: Secondary | ICD-10-CM | POA: Diagnosis not present

## 2020-01-26 DIAGNOSIS — R2689 Other abnormalities of gait and mobility: Secondary | ICD-10-CM | POA: Diagnosis not present

## 2020-01-28 DIAGNOSIS — M6281 Muscle weakness (generalized): Secondary | ICD-10-CM | POA: Diagnosis not present

## 2020-01-28 DIAGNOSIS — R2689 Other abnormalities of gait and mobility: Secondary | ICD-10-CM | POA: Diagnosis not present

## 2020-01-28 DIAGNOSIS — M79604 Pain in right leg: Secondary | ICD-10-CM | POA: Diagnosis not present

## 2020-01-28 DIAGNOSIS — S72141D Displaced intertrochanteric fracture of right femur, subsequent encounter for closed fracture with routine healing: Secondary | ICD-10-CM | POA: Diagnosis not present

## 2020-01-29 DIAGNOSIS — M79604 Pain in right leg: Secondary | ICD-10-CM | POA: Diagnosis not present

## 2020-01-29 DIAGNOSIS — S72141D Displaced intertrochanteric fracture of right femur, subsequent encounter for closed fracture with routine healing: Secondary | ICD-10-CM | POA: Diagnosis not present

## 2020-01-29 DIAGNOSIS — R2689 Other abnormalities of gait and mobility: Secondary | ICD-10-CM | POA: Diagnosis not present

## 2020-01-29 DIAGNOSIS — M6281 Muscle weakness (generalized): Secondary | ICD-10-CM | POA: Diagnosis not present

## 2020-02-03 DIAGNOSIS — S72141D Displaced intertrochanteric fracture of right femur, subsequent encounter for closed fracture with routine healing: Secondary | ICD-10-CM | POA: Diagnosis not present

## 2020-02-03 DIAGNOSIS — R2689 Other abnormalities of gait and mobility: Secondary | ICD-10-CM | POA: Diagnosis not present

## 2020-02-03 DIAGNOSIS — M79604 Pain in right leg: Secondary | ICD-10-CM | POA: Diagnosis not present

## 2020-02-03 DIAGNOSIS — M6281 Muscle weakness (generalized): Secondary | ICD-10-CM | POA: Diagnosis not present

## 2020-02-17 DIAGNOSIS — R2689 Other abnormalities of gait and mobility: Secondary | ICD-10-CM | POA: Diagnosis not present

## 2020-02-17 DIAGNOSIS — M6281 Muscle weakness (generalized): Secondary | ICD-10-CM | POA: Diagnosis not present

## 2020-02-17 DIAGNOSIS — S72141D Displaced intertrochanteric fracture of right femur, subsequent encounter for closed fracture with routine healing: Secondary | ICD-10-CM | POA: Diagnosis not present

## 2020-02-17 DIAGNOSIS — M79604 Pain in right leg: Secondary | ICD-10-CM | POA: Diagnosis not present

## 2020-02-19 DIAGNOSIS — S72141D Displaced intertrochanteric fracture of right femur, subsequent encounter for closed fracture with routine healing: Secondary | ICD-10-CM | POA: Diagnosis not present

## 2020-02-19 DIAGNOSIS — M6281 Muscle weakness (generalized): Secondary | ICD-10-CM | POA: Diagnosis not present

## 2020-02-19 DIAGNOSIS — R2689 Other abnormalities of gait and mobility: Secondary | ICD-10-CM | POA: Diagnosis not present

## 2020-02-19 DIAGNOSIS — M79604 Pain in right leg: Secondary | ICD-10-CM | POA: Diagnosis not present

## 2020-02-24 DIAGNOSIS — M79604 Pain in right leg: Secondary | ICD-10-CM | POA: Diagnosis not present

## 2020-02-24 DIAGNOSIS — R2689 Other abnormalities of gait and mobility: Secondary | ICD-10-CM | POA: Diagnosis not present

## 2020-02-24 DIAGNOSIS — M6281 Muscle weakness (generalized): Secondary | ICD-10-CM | POA: Diagnosis not present

## 2020-02-24 DIAGNOSIS — S72141D Displaced intertrochanteric fracture of right femur, subsequent encounter for closed fracture with routine healing: Secondary | ICD-10-CM | POA: Diagnosis not present

## 2020-02-26 DIAGNOSIS — M6281 Muscle weakness (generalized): Secondary | ICD-10-CM | POA: Diagnosis not present

## 2020-02-26 DIAGNOSIS — M79604 Pain in right leg: Secondary | ICD-10-CM | POA: Diagnosis not present

## 2020-02-26 DIAGNOSIS — S72141D Displaced intertrochanteric fracture of right femur, subsequent encounter for closed fracture with routine healing: Secondary | ICD-10-CM | POA: Diagnosis not present

## 2020-02-26 DIAGNOSIS — R2689 Other abnormalities of gait and mobility: Secondary | ICD-10-CM | POA: Diagnosis not present

## 2020-03-04 DIAGNOSIS — S72141D Displaced intertrochanteric fracture of right femur, subsequent encounter for closed fracture with routine healing: Secondary | ICD-10-CM | POA: Diagnosis not present

## 2020-03-04 DIAGNOSIS — M6281 Muscle weakness (generalized): Secondary | ICD-10-CM | POA: Diagnosis not present

## 2020-03-04 DIAGNOSIS — R2689 Other abnormalities of gait and mobility: Secondary | ICD-10-CM | POA: Diagnosis not present

## 2020-03-04 DIAGNOSIS — M79604 Pain in right leg: Secondary | ICD-10-CM | POA: Diagnosis not present

## 2020-03-09 DIAGNOSIS — R2689 Other abnormalities of gait and mobility: Secondary | ICD-10-CM | POA: Diagnosis not present

## 2020-03-09 DIAGNOSIS — M6281 Muscle weakness (generalized): Secondary | ICD-10-CM | POA: Diagnosis not present

## 2020-03-09 DIAGNOSIS — S72141D Displaced intertrochanteric fracture of right femur, subsequent encounter for closed fracture with routine healing: Secondary | ICD-10-CM | POA: Diagnosis not present

## 2020-03-09 DIAGNOSIS — M79604 Pain in right leg: Secondary | ICD-10-CM | POA: Diagnosis not present

## 2020-03-10 DIAGNOSIS — H353211 Exudative age-related macular degeneration, right eye, with active choroidal neovascularization: Secondary | ICD-10-CM | POA: Diagnosis not present

## 2020-03-11 DIAGNOSIS — M79604 Pain in right leg: Secondary | ICD-10-CM | POA: Diagnosis not present

## 2020-03-11 DIAGNOSIS — R2689 Other abnormalities of gait and mobility: Secondary | ICD-10-CM | POA: Diagnosis not present

## 2020-03-11 DIAGNOSIS — M6281 Muscle weakness (generalized): Secondary | ICD-10-CM | POA: Diagnosis not present

## 2020-03-11 DIAGNOSIS — S72141D Displaced intertrochanteric fracture of right femur, subsequent encounter for closed fracture with routine healing: Secondary | ICD-10-CM | POA: Diagnosis not present

## 2020-03-15 DIAGNOSIS — H353221 Exudative age-related macular degeneration, left eye, with active choroidal neovascularization: Secondary | ICD-10-CM | POA: Diagnosis not present

## 2020-03-16 DIAGNOSIS — R2689 Other abnormalities of gait and mobility: Secondary | ICD-10-CM | POA: Diagnosis not present

## 2020-03-16 DIAGNOSIS — M79604 Pain in right leg: Secondary | ICD-10-CM | POA: Diagnosis not present

## 2020-03-16 DIAGNOSIS — M6281 Muscle weakness (generalized): Secondary | ICD-10-CM | POA: Diagnosis not present

## 2020-03-16 DIAGNOSIS — S72141D Displaced intertrochanteric fracture of right femur, subsequent encounter for closed fracture with routine healing: Secondary | ICD-10-CM | POA: Diagnosis not present

## 2020-03-19 DIAGNOSIS — E1122 Type 2 diabetes mellitus with diabetic chronic kidney disease: Secondary | ICD-10-CM | POA: Diagnosis not present

## 2020-03-19 DIAGNOSIS — N189 Chronic kidney disease, unspecified: Secondary | ICD-10-CM | POA: Diagnosis not present

## 2020-03-19 DIAGNOSIS — R2689 Other abnormalities of gait and mobility: Secondary | ICD-10-CM | POA: Diagnosis not present

## 2020-03-19 DIAGNOSIS — E782 Mixed hyperlipidemia: Secondary | ICD-10-CM | POA: Diagnosis not present

## 2020-03-19 DIAGNOSIS — M6281 Muscle weakness (generalized): Secondary | ICD-10-CM | POA: Diagnosis not present

## 2020-03-19 DIAGNOSIS — D519 Vitamin B12 deficiency anemia, unspecified: Secondary | ICD-10-CM | POA: Diagnosis not present

## 2020-03-19 DIAGNOSIS — E7849 Other hyperlipidemia: Secondary | ICD-10-CM | POA: Diagnosis not present

## 2020-03-19 DIAGNOSIS — D529 Folate deficiency anemia, unspecified: Secondary | ICD-10-CM | POA: Diagnosis not present

## 2020-03-19 DIAGNOSIS — S72141D Displaced intertrochanteric fracture of right femur, subsequent encounter for closed fracture with routine healing: Secondary | ICD-10-CM | POA: Diagnosis not present

## 2020-03-19 DIAGNOSIS — N184 Chronic kidney disease, stage 4 (severe): Secondary | ICD-10-CM | POA: Diagnosis not present

## 2020-03-19 DIAGNOSIS — M79604 Pain in right leg: Secondary | ICD-10-CM | POA: Diagnosis not present

## 2020-03-23 DIAGNOSIS — E039 Hypothyroidism, unspecified: Secondary | ICD-10-CM | POA: Diagnosis not present

## 2020-03-23 DIAGNOSIS — J9611 Chronic respiratory failure with hypoxia: Secondary | ICD-10-CM | POA: Diagnosis not present

## 2020-03-23 DIAGNOSIS — D500 Iron deficiency anemia secondary to blood loss (chronic): Secondary | ICD-10-CM | POA: Diagnosis not present

## 2020-03-23 DIAGNOSIS — S72141D Displaced intertrochanteric fracture of right femur, subsequent encounter for closed fracture with routine healing: Secondary | ICD-10-CM | POA: Diagnosis not present

## 2020-03-23 DIAGNOSIS — M6281 Muscle weakness (generalized): Secondary | ICD-10-CM | POA: Diagnosis not present

## 2020-03-23 DIAGNOSIS — E7849 Other hyperlipidemia: Secondary | ICD-10-CM | POA: Diagnosis not present

## 2020-03-23 DIAGNOSIS — M79604 Pain in right leg: Secondary | ICD-10-CM | POA: Diagnosis not present

## 2020-03-23 DIAGNOSIS — J449 Chronic obstructive pulmonary disease, unspecified: Secondary | ICD-10-CM | POA: Diagnosis not present

## 2020-03-23 DIAGNOSIS — R4582 Worries: Secondary | ICD-10-CM | POA: Diagnosis not present

## 2020-03-23 DIAGNOSIS — E1122 Type 2 diabetes mellitus with diabetic chronic kidney disease: Secondary | ICD-10-CM | POA: Diagnosis not present

## 2020-03-23 DIAGNOSIS — I10 Essential (primary) hypertension: Secondary | ICD-10-CM | POA: Diagnosis not present

## 2020-03-23 DIAGNOSIS — R2689 Other abnormalities of gait and mobility: Secondary | ICD-10-CM | POA: Diagnosis not present

## 2020-03-25 DIAGNOSIS — S72141D Displaced intertrochanteric fracture of right femur, subsequent encounter for closed fracture with routine healing: Secondary | ICD-10-CM | POA: Diagnosis not present

## 2020-03-25 DIAGNOSIS — R2689 Other abnormalities of gait and mobility: Secondary | ICD-10-CM | POA: Diagnosis not present

## 2020-03-25 DIAGNOSIS — M6281 Muscle weakness (generalized): Secondary | ICD-10-CM | POA: Diagnosis not present

## 2020-03-25 DIAGNOSIS — M79604 Pain in right leg: Secondary | ICD-10-CM | POA: Diagnosis not present

## 2020-03-31 DIAGNOSIS — R2689 Other abnormalities of gait and mobility: Secondary | ICD-10-CM | POA: Diagnosis not present

## 2020-03-31 DIAGNOSIS — M79604 Pain in right leg: Secondary | ICD-10-CM | POA: Diagnosis not present

## 2020-03-31 DIAGNOSIS — M6281 Muscle weakness (generalized): Secondary | ICD-10-CM | POA: Diagnosis not present

## 2020-03-31 DIAGNOSIS — S72141D Displaced intertrochanteric fracture of right femur, subsequent encounter for closed fracture with routine healing: Secondary | ICD-10-CM | POA: Diagnosis not present

## 2020-04-03 DIAGNOSIS — J329 Chronic sinusitis, unspecified: Secondary | ICD-10-CM | POA: Diagnosis not present

## 2020-04-06 DIAGNOSIS — M79604 Pain in right leg: Secondary | ICD-10-CM | POA: Diagnosis not present

## 2020-04-06 DIAGNOSIS — M6281 Muscle weakness (generalized): Secondary | ICD-10-CM | POA: Diagnosis not present

## 2020-04-06 DIAGNOSIS — S72141D Displaced intertrochanteric fracture of right femur, subsequent encounter for closed fracture with routine healing: Secondary | ICD-10-CM | POA: Diagnosis not present

## 2020-04-06 DIAGNOSIS — R2689 Other abnormalities of gait and mobility: Secondary | ICD-10-CM | POA: Diagnosis not present

## 2020-04-08 DIAGNOSIS — M79604 Pain in right leg: Secondary | ICD-10-CM | POA: Diagnosis not present

## 2020-04-08 DIAGNOSIS — S72141D Displaced intertrochanteric fracture of right femur, subsequent encounter for closed fracture with routine healing: Secondary | ICD-10-CM | POA: Diagnosis not present

## 2020-04-08 DIAGNOSIS — M6281 Muscle weakness (generalized): Secondary | ICD-10-CM | POA: Diagnosis not present

## 2020-04-08 DIAGNOSIS — R2689 Other abnormalities of gait and mobility: Secondary | ICD-10-CM | POA: Diagnosis not present

## 2020-04-09 DIAGNOSIS — J0101 Acute recurrent maxillary sinusitis: Secondary | ICD-10-CM | POA: Diagnosis not present

## 2020-04-13 DIAGNOSIS — J301 Allergic rhinitis due to pollen: Secondary | ICD-10-CM | POA: Diagnosis not present

## 2020-04-14 DIAGNOSIS — J329 Chronic sinusitis, unspecified: Secondary | ICD-10-CM | POA: Diagnosis not present

## 2020-04-20 DIAGNOSIS — L570 Actinic keratosis: Secondary | ICD-10-CM | POA: Diagnosis not present

## 2020-04-20 DIAGNOSIS — D485 Neoplasm of uncertain behavior of skin: Secondary | ICD-10-CM | POA: Diagnosis not present

## 2020-04-20 DIAGNOSIS — S72141D Displaced intertrochanteric fracture of right femur, subsequent encounter for closed fracture with routine healing: Secondary | ICD-10-CM | POA: Diagnosis not present

## 2020-04-20 DIAGNOSIS — R2689 Other abnormalities of gait and mobility: Secondary | ICD-10-CM | POA: Diagnosis not present

## 2020-04-20 DIAGNOSIS — M79604 Pain in right leg: Secondary | ICD-10-CM | POA: Diagnosis not present

## 2020-04-20 DIAGNOSIS — M6281 Muscle weakness (generalized): Secondary | ICD-10-CM | POA: Diagnosis not present

## 2020-04-20 DIAGNOSIS — L308 Other specified dermatitis: Secondary | ICD-10-CM | POA: Diagnosis not present

## 2020-04-22 DIAGNOSIS — R2689 Other abnormalities of gait and mobility: Secondary | ICD-10-CM | POA: Diagnosis not present

## 2020-04-22 DIAGNOSIS — S72141D Displaced intertrochanteric fracture of right femur, subsequent encounter for closed fracture with routine healing: Secondary | ICD-10-CM | POA: Diagnosis not present

## 2020-04-22 DIAGNOSIS — M6281 Muscle weakness (generalized): Secondary | ICD-10-CM | POA: Diagnosis not present

## 2020-04-22 DIAGNOSIS — M79604 Pain in right leg: Secondary | ICD-10-CM | POA: Diagnosis not present

## 2020-04-27 DIAGNOSIS — M79604 Pain in right leg: Secondary | ICD-10-CM | POA: Diagnosis not present

## 2020-04-27 DIAGNOSIS — M6281 Muscle weakness (generalized): Secondary | ICD-10-CM | POA: Diagnosis not present

## 2020-04-27 DIAGNOSIS — R2689 Other abnormalities of gait and mobility: Secondary | ICD-10-CM | POA: Diagnosis not present

## 2020-04-27 DIAGNOSIS — S72141D Displaced intertrochanteric fracture of right femur, subsequent encounter for closed fracture with routine healing: Secondary | ICD-10-CM | POA: Diagnosis not present

## 2020-04-28 DIAGNOSIS — H353211 Exudative age-related macular degeneration, right eye, with active choroidal neovascularization: Secondary | ICD-10-CM | POA: Diagnosis not present

## 2020-04-29 DIAGNOSIS — I87311 Chronic venous hypertension (idiopathic) with ulcer of right lower extremity: Secondary | ICD-10-CM | POA: Diagnosis not present

## 2020-04-29 DIAGNOSIS — L0100 Impetigo, unspecified: Secondary | ICD-10-CM | POA: Diagnosis not present

## 2020-05-04 DIAGNOSIS — M7061 Trochanteric bursitis, right hip: Secondary | ICD-10-CM | POA: Diagnosis not present

## 2020-05-04 DIAGNOSIS — S72141D Displaced intertrochanteric fracture of right femur, subsequent encounter for closed fracture with routine healing: Secondary | ICD-10-CM | POA: Diagnosis not present

## 2020-05-05 DIAGNOSIS — H353221 Exudative age-related macular degeneration, left eye, with active choroidal neovascularization: Secondary | ICD-10-CM | POA: Diagnosis not present

## 2020-05-06 DIAGNOSIS — I87311 Chronic venous hypertension (idiopathic) with ulcer of right lower extremity: Secondary | ICD-10-CM | POA: Diagnosis not present

## 2020-05-13 DIAGNOSIS — I87311 Chronic venous hypertension (idiopathic) with ulcer of right lower extremity: Secondary | ICD-10-CM | POA: Diagnosis not present

## 2020-05-17 DIAGNOSIS — I87311 Chronic venous hypertension (idiopathic) with ulcer of right lower extremity: Secondary | ICD-10-CM | POA: Diagnosis not present

## 2020-05-17 DIAGNOSIS — R2243 Localized swelling, mass and lump, lower limb, bilateral: Secondary | ICD-10-CM | POA: Diagnosis not present

## 2020-05-24 DIAGNOSIS — Z85828 Personal history of other malignant neoplasm of skin: Secondary | ICD-10-CM | POA: Diagnosis not present

## 2020-05-24 DIAGNOSIS — L97909 Non-pressure chronic ulcer of unspecified part of unspecified lower leg with unspecified severity: Secondary | ICD-10-CM | POA: Diagnosis not present

## 2020-05-31 DIAGNOSIS — S72141D Displaced intertrochanteric fracture of right femur, subsequent encounter for closed fracture with routine healing: Secondary | ICD-10-CM | POA: Diagnosis not present

## 2020-05-31 DIAGNOSIS — M6281 Muscle weakness (generalized): Secondary | ICD-10-CM | POA: Diagnosis not present

## 2020-05-31 DIAGNOSIS — M79604 Pain in right leg: Secondary | ICD-10-CM | POA: Diagnosis not present

## 2020-05-31 DIAGNOSIS — R2689 Other abnormalities of gait and mobility: Secondary | ICD-10-CM | POA: Diagnosis not present

## 2020-06-01 DIAGNOSIS — M6281 Muscle weakness (generalized): Secondary | ICD-10-CM | POA: Diagnosis not present

## 2020-06-01 DIAGNOSIS — M79604 Pain in right leg: Secondary | ICD-10-CM | POA: Diagnosis not present

## 2020-06-01 DIAGNOSIS — S72141D Displaced intertrochanteric fracture of right femur, subsequent encounter for closed fracture with routine healing: Secondary | ICD-10-CM | POA: Diagnosis not present

## 2020-06-01 DIAGNOSIS — R2689 Other abnormalities of gait and mobility: Secondary | ICD-10-CM | POA: Diagnosis not present

## 2020-06-02 DIAGNOSIS — S72141D Displaced intertrochanteric fracture of right femur, subsequent encounter for closed fracture with routine healing: Secondary | ICD-10-CM | POA: Diagnosis not present

## 2020-06-02 DIAGNOSIS — M6281 Muscle weakness (generalized): Secondary | ICD-10-CM | POA: Diagnosis not present

## 2020-06-02 DIAGNOSIS — M79604 Pain in right leg: Secondary | ICD-10-CM | POA: Diagnosis not present

## 2020-06-02 DIAGNOSIS — R2689 Other abnormalities of gait and mobility: Secondary | ICD-10-CM | POA: Diagnosis not present

## 2020-06-08 DIAGNOSIS — M79604 Pain in right leg: Secondary | ICD-10-CM | POA: Diagnosis not present

## 2020-06-08 DIAGNOSIS — M6281 Muscle weakness (generalized): Secondary | ICD-10-CM | POA: Diagnosis not present

## 2020-06-08 DIAGNOSIS — S72141D Displaced intertrochanteric fracture of right femur, subsequent encounter for closed fracture with routine healing: Secondary | ICD-10-CM | POA: Diagnosis not present

## 2020-06-08 DIAGNOSIS — R2689 Other abnormalities of gait and mobility: Secondary | ICD-10-CM | POA: Diagnosis not present

## 2020-06-10 DIAGNOSIS — M79604 Pain in right leg: Secondary | ICD-10-CM | POA: Diagnosis not present

## 2020-06-10 DIAGNOSIS — R2689 Other abnormalities of gait and mobility: Secondary | ICD-10-CM | POA: Diagnosis not present

## 2020-06-10 DIAGNOSIS — M6281 Muscle weakness (generalized): Secondary | ICD-10-CM | POA: Diagnosis not present

## 2020-06-10 DIAGNOSIS — S72141D Displaced intertrochanteric fracture of right femur, subsequent encounter for closed fracture with routine healing: Secondary | ICD-10-CM | POA: Diagnosis not present

## 2020-06-15 DIAGNOSIS — S72141D Displaced intertrochanteric fracture of right femur, subsequent encounter for closed fracture with routine healing: Secondary | ICD-10-CM | POA: Diagnosis not present

## 2020-06-15 DIAGNOSIS — R2689 Other abnormalities of gait and mobility: Secondary | ICD-10-CM | POA: Diagnosis not present

## 2020-06-15 DIAGNOSIS — M79604 Pain in right leg: Secondary | ICD-10-CM | POA: Diagnosis not present

## 2020-06-15 DIAGNOSIS — M6281 Muscle weakness (generalized): Secondary | ICD-10-CM | POA: Diagnosis not present

## 2020-06-16 DIAGNOSIS — H353211 Exudative age-related macular degeneration, right eye, with active choroidal neovascularization: Secondary | ICD-10-CM | POA: Diagnosis not present

## 2020-06-18 DIAGNOSIS — I10 Essential (primary) hypertension: Secondary | ICD-10-CM | POA: Diagnosis not present

## 2020-06-18 DIAGNOSIS — N183 Chronic kidney disease, stage 3 unspecified: Secondary | ICD-10-CM | POA: Diagnosis not present

## 2020-06-18 DIAGNOSIS — E1122 Type 2 diabetes mellitus with diabetic chronic kidney disease: Secondary | ICD-10-CM | POA: Diagnosis not present

## 2020-06-18 DIAGNOSIS — N189 Chronic kidney disease, unspecified: Secondary | ICD-10-CM | POA: Diagnosis not present

## 2020-06-18 DIAGNOSIS — E782 Mixed hyperlipidemia: Secondary | ICD-10-CM | POA: Diagnosis not present

## 2020-06-18 DIAGNOSIS — E875 Hyperkalemia: Secondary | ICD-10-CM | POA: Diagnosis not present

## 2020-06-18 DIAGNOSIS — E039 Hypothyroidism, unspecified: Secondary | ICD-10-CM | POA: Diagnosis not present

## 2020-06-18 DIAGNOSIS — E7849 Other hyperlipidemia: Secondary | ICD-10-CM | POA: Diagnosis not present

## 2020-06-22 DIAGNOSIS — I714 Abdominal aortic aneurysm, without rupture: Secondary | ICD-10-CM | POA: Diagnosis not present

## 2020-06-22 DIAGNOSIS — E782 Mixed hyperlipidemia: Secondary | ICD-10-CM | POA: Diagnosis not present

## 2020-06-22 DIAGNOSIS — Z23 Encounter for immunization: Secondary | ICD-10-CM | POA: Diagnosis not present

## 2020-06-22 DIAGNOSIS — J9611 Chronic respiratory failure with hypoxia: Secondary | ICD-10-CM | POA: Diagnosis not present

## 2020-06-22 DIAGNOSIS — Z1212 Encounter for screening for malignant neoplasm of rectum: Secondary | ICD-10-CM | POA: Diagnosis not present

## 2020-06-22 DIAGNOSIS — F331 Major depressive disorder, recurrent, moderate: Secondary | ICD-10-CM | POA: Diagnosis not present

## 2020-06-22 DIAGNOSIS — I10 Essential (primary) hypertension: Secondary | ICD-10-CM | POA: Diagnosis not present

## 2020-06-22 DIAGNOSIS — N184 Chronic kidney disease, stage 4 (severe): Secondary | ICD-10-CM | POA: Diagnosis not present

## 2020-06-22 DIAGNOSIS — D500 Iron deficiency anemia secondary to blood loss (chronic): Secondary | ICD-10-CM | POA: Diagnosis not present

## 2020-06-22 DIAGNOSIS — Z0001 Encounter for general adult medical examination with abnormal findings: Secondary | ICD-10-CM | POA: Diagnosis not present

## 2020-06-22 DIAGNOSIS — R4582 Worries: Secondary | ICD-10-CM | POA: Diagnosis not present

## 2020-06-22 DIAGNOSIS — E1122 Type 2 diabetes mellitus with diabetic chronic kidney disease: Secondary | ICD-10-CM | POA: Diagnosis not present

## 2020-06-22 DIAGNOSIS — E039 Hypothyroidism, unspecified: Secondary | ICD-10-CM | POA: Diagnosis not present

## 2020-06-22 DIAGNOSIS — J449 Chronic obstructive pulmonary disease, unspecified: Secondary | ICD-10-CM | POA: Diagnosis not present

## 2020-06-22 DIAGNOSIS — E11622 Type 2 diabetes mellitus with other skin ulcer: Secondary | ICD-10-CM | POA: Diagnosis not present

## 2020-06-22 DIAGNOSIS — K2100 Gastro-esophageal reflux disease with esophagitis, without bleeding: Secondary | ICD-10-CM | POA: Diagnosis not present

## 2020-06-28 DIAGNOSIS — H353221 Exudative age-related macular degeneration, left eye, with active choroidal neovascularization: Secondary | ICD-10-CM | POA: Diagnosis not present

## 2020-06-29 DIAGNOSIS — H0100B Unspecified blepharitis left eye, upper and lower eyelids: Secondary | ICD-10-CM | POA: Diagnosis not present

## 2020-06-29 DIAGNOSIS — H18593 Other hereditary corneal dystrophies, bilateral: Secondary | ICD-10-CM | POA: Diagnosis not present

## 2020-06-29 DIAGNOSIS — H0100A Unspecified blepharitis right eye, upper and lower eyelids: Secondary | ICD-10-CM | POA: Diagnosis not present

## 2020-07-13 DIAGNOSIS — R2689 Other abnormalities of gait and mobility: Secondary | ICD-10-CM | POA: Diagnosis not present

## 2020-07-13 DIAGNOSIS — S72141D Displaced intertrochanteric fracture of right femur, subsequent encounter for closed fracture with routine healing: Secondary | ICD-10-CM | POA: Diagnosis not present

## 2020-07-13 DIAGNOSIS — M79604 Pain in right leg: Secondary | ICD-10-CM | POA: Diagnosis not present

## 2020-07-13 DIAGNOSIS — M6281 Muscle weakness (generalized): Secondary | ICD-10-CM | POA: Diagnosis not present

## 2020-07-15 DIAGNOSIS — Z882 Allergy status to sulfonamides status: Secondary | ICD-10-CM | POA: Diagnosis not present

## 2020-07-15 DIAGNOSIS — R932 Abnormal findings on diagnostic imaging of liver and biliary tract: Secondary | ICD-10-CM | POA: Diagnosis not present

## 2020-07-15 DIAGNOSIS — Z7982 Long term (current) use of aspirin: Secondary | ICD-10-CM | POA: Diagnosis not present

## 2020-07-15 DIAGNOSIS — Z95828 Presence of other vascular implants and grafts: Secondary | ICD-10-CM | POA: Diagnosis not present

## 2020-07-15 DIAGNOSIS — Z87891 Personal history of nicotine dependence: Secondary | ICD-10-CM | POA: Diagnosis not present

## 2020-07-15 DIAGNOSIS — R918 Other nonspecific abnormal finding of lung field: Secondary | ICD-10-CM | POA: Diagnosis not present

## 2020-07-15 DIAGNOSIS — E119 Type 2 diabetes mellitus without complications: Secondary | ICD-10-CM | POA: Diagnosis not present

## 2020-07-15 DIAGNOSIS — Z09 Encounter for follow-up examination after completed treatment for conditions other than malignant neoplasm: Secondary | ICD-10-CM | POA: Diagnosis not present

## 2020-07-15 DIAGNOSIS — I716 Thoracoabdominal aortic aneurysm, without rupture: Secondary | ICD-10-CM | POA: Diagnosis not present

## 2020-07-15 DIAGNOSIS — I10 Essential (primary) hypertension: Secondary | ICD-10-CM | POA: Diagnosis not present

## 2020-07-15 DIAGNOSIS — I714 Abdominal aortic aneurysm, without rupture: Secondary | ICD-10-CM | POA: Diagnosis not present

## 2020-07-15 DIAGNOSIS — Z880 Allergy status to penicillin: Secondary | ICD-10-CM | POA: Diagnosis not present

## 2020-07-16 DIAGNOSIS — R0902 Hypoxemia: Secondary | ICD-10-CM | POA: Diagnosis not present

## 2020-07-16 DIAGNOSIS — S42215A Unspecified nondisplaced fracture of surgical neck of left humerus, initial encounter for closed fracture: Secondary | ICD-10-CM | POA: Diagnosis not present

## 2020-07-16 DIAGNOSIS — J9611 Chronic respiratory failure with hypoxia: Secondary | ICD-10-CM | POA: Diagnosis not present

## 2020-07-16 DIAGNOSIS — M25519 Pain in unspecified shoulder: Secondary | ICD-10-CM | POA: Diagnosis not present

## 2020-07-16 DIAGNOSIS — S42295A Other nondisplaced fracture of upper end of left humerus, initial encounter for closed fracture: Secondary | ICD-10-CM | POA: Diagnosis not present

## 2020-07-16 DIAGNOSIS — E222 Syndrome of inappropriate secretion of antidiuretic hormone: Secondary | ICD-10-CM | POA: Diagnosis not present

## 2020-07-16 DIAGNOSIS — I5032 Chronic diastolic (congestive) heart failure: Secondary | ICD-10-CM | POA: Diagnosis not present

## 2020-07-16 DIAGNOSIS — M4326 Fusion of spine, lumbar region: Secondary | ICD-10-CM | POA: Diagnosis not present

## 2020-07-16 DIAGNOSIS — E1122 Type 2 diabetes mellitus with diabetic chronic kidney disease: Secondary | ICD-10-CM | POA: Diagnosis not present

## 2020-07-16 DIAGNOSIS — I130 Hypertensive heart and chronic kidney disease with heart failure and stage 1 through stage 4 chronic kidney disease, or unspecified chronic kidney disease: Secondary | ICD-10-CM | POA: Diagnosis not present

## 2020-07-16 DIAGNOSIS — W19XXXA Unspecified fall, initial encounter: Secondary | ICD-10-CM | POA: Diagnosis not present

## 2020-07-16 DIAGNOSIS — S42225D 2-part nondisplaced fracture of surgical neck of left humerus, subsequent encounter for fracture with routine healing: Secondary | ICD-10-CM | POA: Diagnosis not present

## 2020-07-16 DIAGNOSIS — S42212A Unspecified displaced fracture of surgical neck of left humerus, initial encounter for closed fracture: Secondary | ICD-10-CM | POA: Diagnosis not present

## 2020-07-16 DIAGNOSIS — Z981 Arthrodesis status: Secondary | ICD-10-CM | POA: Diagnosis not present

## 2020-07-16 DIAGNOSIS — S4982XA Other specified injuries of left shoulder and upper arm, initial encounter: Secondary | ICD-10-CM | POA: Diagnosis not present

## 2020-07-16 DIAGNOSIS — Z043 Encounter for examination and observation following other accident: Secondary | ICD-10-CM | POA: Diagnosis not present

## 2020-07-16 DIAGNOSIS — Z20822 Contact with and (suspected) exposure to covid-19: Secondary | ICD-10-CM | POA: Diagnosis not present

## 2020-07-16 DIAGNOSIS — N184 Chronic kidney disease, stage 4 (severe): Secondary | ICD-10-CM | POA: Diagnosis not present

## 2020-07-16 DIAGNOSIS — M79632 Pain in left forearm: Secondary | ICD-10-CM | POA: Diagnosis not present

## 2020-07-17 DIAGNOSIS — S42225D 2-part nondisplaced fracture of surgical neck of left humerus, subsequent encounter for fracture with routine healing: Secondary | ICD-10-CM | POA: Diagnosis not present

## 2020-07-17 DIAGNOSIS — E1122 Type 2 diabetes mellitus with diabetic chronic kidney disease: Secondary | ICD-10-CM | POA: Diagnosis not present

## 2020-07-18 DIAGNOSIS — E1122 Type 2 diabetes mellitus with diabetic chronic kidney disease: Secondary | ICD-10-CM | POA: Diagnosis not present

## 2020-07-18 DIAGNOSIS — I517 Cardiomegaly: Secondary | ICD-10-CM | POA: Diagnosis not present

## 2020-07-18 DIAGNOSIS — S42225D 2-part nondisplaced fracture of surgical neck of left humerus, subsequent encounter for fracture with routine healing: Secondary | ICD-10-CM | POA: Diagnosis not present

## 2020-07-18 DIAGNOSIS — R509 Fever, unspecified: Secondary | ICD-10-CM | POA: Diagnosis not present

## 2020-07-19 DIAGNOSIS — N184 Chronic kidney disease, stage 4 (severe): Secondary | ICD-10-CM | POA: Diagnosis present

## 2020-07-19 DIAGNOSIS — E1151 Type 2 diabetes mellitus with diabetic peripheral angiopathy without gangrene: Secondary | ICD-10-CM | POA: Diagnosis present

## 2020-07-19 DIAGNOSIS — R41841 Cognitive communication deficit: Secondary | ICD-10-CM | POA: Diagnosis not present

## 2020-07-19 DIAGNOSIS — R2689 Other abnormalities of gait and mobility: Secondary | ICD-10-CM | POA: Diagnosis not present

## 2020-07-19 DIAGNOSIS — Z885 Allergy status to narcotic agent status: Secondary | ICD-10-CM | POA: Diagnosis not present

## 2020-07-19 DIAGNOSIS — G609 Hereditary and idiopathic neuropathy, unspecified: Secondary | ICD-10-CM | POA: Diagnosis present

## 2020-07-19 DIAGNOSIS — S43102A Unspecified dislocation of left acromioclavicular joint, initial encounter: Secondary | ICD-10-CM | POA: Diagnosis present

## 2020-07-19 DIAGNOSIS — S42225D 2-part nondisplaced fracture of surgical neck of left humerus, subsequent encounter for fracture with routine healing: Secondary | ICD-10-CM | POA: Diagnosis not present

## 2020-07-19 DIAGNOSIS — I5032 Chronic diastolic (congestive) heart failure: Secondary | ICD-10-CM | POA: Diagnosis present

## 2020-07-19 DIAGNOSIS — E222 Syndrome of inappropriate secretion of antidiuretic hormone: Secondary | ICD-10-CM | POA: Diagnosis present

## 2020-07-19 DIAGNOSIS — S42252A Displaced fracture of greater tuberosity of left humerus, initial encounter for closed fracture: Secondary | ICD-10-CM | POA: Diagnosis not present

## 2020-07-19 DIAGNOSIS — D631 Anemia in chronic kidney disease: Secondary | ICD-10-CM | POA: Diagnosis present

## 2020-07-19 DIAGNOSIS — Z794 Long term (current) use of insulin: Secondary | ICD-10-CM | POA: Diagnosis not present

## 2020-07-19 DIAGNOSIS — S42295A Other nondisplaced fracture of upper end of left humerus, initial encounter for closed fracture: Secondary | ICD-10-CM | POA: Diagnosis present

## 2020-07-19 DIAGNOSIS — Z20822 Contact with and (suspected) exposure to covid-19: Secondary | ICD-10-CM | POA: Diagnosis present

## 2020-07-19 DIAGNOSIS — M6281 Muscle weakness (generalized): Secondary | ICD-10-CM | POA: Diagnosis not present

## 2020-07-19 DIAGNOSIS — Z87891 Personal history of nicotine dependence: Secondary | ICD-10-CM | POA: Diagnosis not present

## 2020-07-19 DIAGNOSIS — Z9181 History of falling: Secondary | ICD-10-CM | POA: Diagnosis not present

## 2020-07-19 DIAGNOSIS — M25512 Pain in left shoulder: Secondary | ICD-10-CM | POA: Diagnosis not present

## 2020-07-19 DIAGNOSIS — Z882 Allergy status to sulfonamides status: Secondary | ICD-10-CM | POA: Diagnosis not present

## 2020-07-19 DIAGNOSIS — S42215D Unspecified nondisplaced fracture of surgical neck of left humerus, subsequent encounter for fracture with routine healing: Secondary | ICD-10-CM | POA: Diagnosis not present

## 2020-07-19 DIAGNOSIS — S42215A Unspecified nondisplaced fracture of surgical neck of left humerus, initial encounter for closed fracture: Secondary | ICD-10-CM | POA: Diagnosis not present

## 2020-07-19 DIAGNOSIS — E1122 Type 2 diabetes mellitus with diabetic chronic kidney disease: Secondary | ICD-10-CM | POA: Diagnosis present

## 2020-07-19 DIAGNOSIS — J9611 Chronic respiratory failure with hypoxia: Secondary | ICD-10-CM | POA: Diagnosis present

## 2020-07-19 DIAGNOSIS — Z91012 Allergy to eggs: Secondary | ICD-10-CM | POA: Diagnosis not present

## 2020-07-19 DIAGNOSIS — Z880 Allergy status to penicillin: Secondary | ICD-10-CM | POA: Diagnosis not present

## 2020-07-19 DIAGNOSIS — I716 Thoracoabdominal aortic aneurysm, without rupture: Secondary | ICD-10-CM | POA: Diagnosis present

## 2020-07-19 DIAGNOSIS — E063 Autoimmune thyroiditis: Secondary | ICD-10-CM | POA: Diagnosis present

## 2020-07-19 DIAGNOSIS — I130 Hypertensive heart and chronic kidney disease with heart failure and stage 1 through stage 4 chronic kidney disease, or unspecified chronic kidney disease: Secondary | ICD-10-CM | POA: Diagnosis present

## 2020-07-19 DIAGNOSIS — J449 Chronic obstructive pulmonary disease, unspecified: Secondary | ICD-10-CM | POA: Diagnosis not present

## 2020-07-19 DIAGNOSIS — E875 Hyperkalemia: Secondary | ICD-10-CM | POA: Diagnosis present

## 2020-07-19 DIAGNOSIS — S42212A Unspecified displaced fracture of surgical neck of left humerus, initial encounter for closed fracture: Secondary | ICD-10-CM | POA: Diagnosis not present

## 2020-07-19 DIAGNOSIS — E785 Hyperlipidemia, unspecified: Secondary | ICD-10-CM | POA: Diagnosis present

## 2020-07-19 DIAGNOSIS — S42292A Other displaced fracture of upper end of left humerus, initial encounter for closed fracture: Secondary | ICD-10-CM | POA: Diagnosis not present

## 2020-07-19 DIAGNOSIS — G8929 Other chronic pain: Secondary | ICD-10-CM | POA: Diagnosis present

## 2020-07-23 DIAGNOSIS — Z9181 History of falling: Secondary | ICD-10-CM | POA: Diagnosis not present

## 2020-07-23 DIAGNOSIS — N184 Chronic kidney disease, stage 4 (severe): Secondary | ICD-10-CM | POA: Diagnosis not present

## 2020-07-23 DIAGNOSIS — R2689 Other abnormalities of gait and mobility: Secondary | ICD-10-CM | POA: Diagnosis not present

## 2020-07-23 DIAGNOSIS — J9611 Chronic respiratory failure with hypoxia: Secondary | ICD-10-CM | POA: Diagnosis not present

## 2020-07-23 DIAGNOSIS — J449 Chronic obstructive pulmonary disease, unspecified: Secondary | ICD-10-CM | POA: Diagnosis not present

## 2020-07-23 DIAGNOSIS — M25532 Pain in left wrist: Secondary | ICD-10-CM | POA: Diagnosis not present

## 2020-07-23 DIAGNOSIS — S42225D 2-part nondisplaced fracture of surgical neck of left humerus, subsequent encounter for fracture with routine healing: Secondary | ICD-10-CM | POA: Diagnosis not present

## 2020-07-23 DIAGNOSIS — M25512 Pain in left shoulder: Secondary | ICD-10-CM | POA: Diagnosis not present

## 2020-07-23 DIAGNOSIS — Z6837 Body mass index (BMI) 37.0-37.9, adult: Secondary | ICD-10-CM | POA: Diagnosis not present

## 2020-07-23 DIAGNOSIS — R41841 Cognitive communication deficit: Secondary | ICD-10-CM | POA: Diagnosis not present

## 2020-07-23 DIAGNOSIS — M6281 Muscle weakness (generalized): Secondary | ICD-10-CM | POA: Diagnosis not present

## 2020-07-23 DIAGNOSIS — I5032 Chronic diastolic (congestive) heart failure: Secondary | ICD-10-CM | POA: Diagnosis not present

## 2020-07-23 DIAGNOSIS — S42202A Unspecified fracture of upper end of left humerus, initial encounter for closed fracture: Secondary | ICD-10-CM | POA: Diagnosis not present

## 2020-07-23 DIAGNOSIS — E1122 Type 2 diabetes mellitus with diabetic chronic kidney disease: Secondary | ICD-10-CM | POA: Diagnosis not present

## 2020-07-23 DIAGNOSIS — S42215D Unspecified nondisplaced fracture of surgical neck of left humerus, subsequent encounter for fracture with routine healing: Secondary | ICD-10-CM | POA: Diagnosis not present

## 2020-07-24 DIAGNOSIS — S42225D 2-part nondisplaced fracture of surgical neck of left humerus, subsequent encounter for fracture with routine healing: Secondary | ICD-10-CM | POA: Diagnosis not present

## 2020-07-24 DIAGNOSIS — E1122 Type 2 diabetes mellitus with diabetic chronic kidney disease: Secondary | ICD-10-CM | POA: Diagnosis not present

## 2020-08-04 DIAGNOSIS — E1122 Type 2 diabetes mellitus with diabetic chronic kidney disease: Secondary | ICD-10-CM | POA: Diagnosis not present

## 2020-08-04 DIAGNOSIS — S42225D 2-part nondisplaced fracture of surgical neck of left humerus, subsequent encounter for fracture with routine healing: Secondary | ICD-10-CM | POA: Diagnosis not present

## 2020-08-09 DIAGNOSIS — M25512 Pain in left shoulder: Secondary | ICD-10-CM | POA: Diagnosis not present

## 2020-08-09 DIAGNOSIS — Z6837 Body mass index (BMI) 37.0-37.9, adult: Secondary | ICD-10-CM | POA: Diagnosis not present

## 2020-08-09 DIAGNOSIS — M25532 Pain in left wrist: Secondary | ICD-10-CM | POA: Diagnosis not present

## 2020-08-09 DIAGNOSIS — S42202A Unspecified fracture of upper end of left humerus, initial encounter for closed fracture: Secondary | ICD-10-CM | POA: Diagnosis not present

## 2020-08-15 DIAGNOSIS — S42225D 2-part nondisplaced fracture of surgical neck of left humerus, subsequent encounter for fracture with routine healing: Secondary | ICD-10-CM | POA: Diagnosis not present

## 2020-08-15 DIAGNOSIS — E1122 Type 2 diabetes mellitus with diabetic chronic kidney disease: Secondary | ICD-10-CM | POA: Diagnosis not present

## 2020-08-21 DIAGNOSIS — J449 Chronic obstructive pulmonary disease, unspecified: Secondary | ICD-10-CM | POA: Diagnosis not present

## 2020-08-21 DIAGNOSIS — I716 Thoracoabdominal aortic aneurysm, without rupture: Secondary | ICD-10-CM | POA: Diagnosis not present

## 2020-08-21 DIAGNOSIS — K219 Gastro-esophageal reflux disease without esophagitis: Secondary | ICD-10-CM | POA: Diagnosis not present

## 2020-08-21 DIAGNOSIS — E785 Hyperlipidemia, unspecified: Secondary | ICD-10-CM | POA: Diagnosis not present

## 2020-08-21 DIAGNOSIS — E1122 Type 2 diabetes mellitus with diabetic chronic kidney disease: Secondary | ICD-10-CM | POA: Diagnosis not present

## 2020-08-21 DIAGNOSIS — Z7984 Long term (current) use of oral hypoglycemic drugs: Secondary | ICD-10-CM | POA: Diagnosis not present

## 2020-08-21 DIAGNOSIS — S42215D Unspecified nondisplaced fracture of surgical neck of left humerus, subsequent encounter for fracture with routine healing: Secondary | ICD-10-CM | POA: Diagnosis not present

## 2020-08-21 DIAGNOSIS — D631 Anemia in chronic kidney disease: Secondary | ICD-10-CM | POA: Diagnosis not present

## 2020-08-21 DIAGNOSIS — Z9981 Dependence on supplemental oxygen: Secondary | ICD-10-CM | POA: Diagnosis not present

## 2020-08-21 DIAGNOSIS — N184 Chronic kidney disease, stage 4 (severe): Secondary | ICD-10-CM | POA: Diagnosis not present

## 2020-08-21 DIAGNOSIS — M47816 Spondylosis without myelopathy or radiculopathy, lumbar region: Secondary | ICD-10-CM | POA: Diagnosis not present

## 2020-08-21 DIAGNOSIS — E063 Autoimmune thyroiditis: Secondary | ICD-10-CM | POA: Diagnosis not present

## 2020-08-21 DIAGNOSIS — E1142 Type 2 diabetes mellitus with diabetic polyneuropathy: Secondary | ICD-10-CM | POA: Diagnosis not present

## 2020-08-21 DIAGNOSIS — E1151 Type 2 diabetes mellitus with diabetic peripheral angiopathy without gangrene: Secondary | ICD-10-CM | POA: Diagnosis not present

## 2020-08-21 DIAGNOSIS — I110 Hypertensive heart disease with heart failure: Secondary | ICD-10-CM | POA: Diagnosis not present

## 2020-08-21 DIAGNOSIS — F419 Anxiety disorder, unspecified: Secondary | ICD-10-CM | POA: Diagnosis not present

## 2020-08-21 DIAGNOSIS — I5032 Chronic diastolic (congestive) heart failure: Secondary | ICD-10-CM | POA: Diagnosis not present

## 2020-08-21 DIAGNOSIS — Z9181 History of falling: Secondary | ICD-10-CM | POA: Diagnosis not present

## 2020-08-21 DIAGNOSIS — G894 Chronic pain syndrome: Secondary | ICD-10-CM | POA: Diagnosis not present

## 2020-08-21 DIAGNOSIS — E222 Syndrome of inappropriate secretion of antidiuretic hormone: Secondary | ICD-10-CM | POA: Diagnosis not present

## 2020-08-21 DIAGNOSIS — K5900 Constipation, unspecified: Secondary | ICD-10-CM | POA: Diagnosis not present

## 2020-08-21 DIAGNOSIS — J9611 Chronic respiratory failure with hypoxia: Secondary | ICD-10-CM | POA: Diagnosis not present

## 2020-08-21 DIAGNOSIS — Z7982 Long term (current) use of aspirin: Secondary | ICD-10-CM | POA: Diagnosis not present

## 2020-08-21 DIAGNOSIS — E039 Hypothyroidism, unspecified: Secondary | ICD-10-CM | POA: Diagnosis not present

## 2020-08-23 DIAGNOSIS — I110 Hypertensive heart disease with heart failure: Secondary | ICD-10-CM | POA: Diagnosis not present

## 2020-08-23 DIAGNOSIS — N184 Chronic kidney disease, stage 4 (severe): Secondary | ICD-10-CM | POA: Diagnosis not present

## 2020-08-23 DIAGNOSIS — E1122 Type 2 diabetes mellitus with diabetic chronic kidney disease: Secondary | ICD-10-CM | POA: Diagnosis not present

## 2020-08-23 DIAGNOSIS — S42215D Unspecified nondisplaced fracture of surgical neck of left humerus, subsequent encounter for fracture with routine healing: Secondary | ICD-10-CM | POA: Diagnosis not present

## 2020-08-23 DIAGNOSIS — I5032 Chronic diastolic (congestive) heart failure: Secondary | ICD-10-CM | POA: Diagnosis not present

## 2020-08-23 DIAGNOSIS — D631 Anemia in chronic kidney disease: Secondary | ICD-10-CM | POA: Diagnosis not present

## 2020-08-24 DIAGNOSIS — S42215D Unspecified nondisplaced fracture of surgical neck of left humerus, subsequent encounter for fracture with routine healing: Secondary | ICD-10-CM | POA: Diagnosis not present

## 2020-08-24 DIAGNOSIS — I5032 Chronic diastolic (congestive) heart failure: Secondary | ICD-10-CM | POA: Diagnosis not present

## 2020-08-24 DIAGNOSIS — E1122 Type 2 diabetes mellitus with diabetic chronic kidney disease: Secondary | ICD-10-CM | POA: Diagnosis not present

## 2020-08-24 DIAGNOSIS — D631 Anemia in chronic kidney disease: Secondary | ICD-10-CM | POA: Diagnosis not present

## 2020-08-24 DIAGNOSIS — N184 Chronic kidney disease, stage 4 (severe): Secondary | ICD-10-CM | POA: Diagnosis not present

## 2020-08-24 DIAGNOSIS — I110 Hypertensive heart disease with heart failure: Secondary | ICD-10-CM | POA: Diagnosis not present

## 2020-08-25 DIAGNOSIS — I5032 Chronic diastolic (congestive) heart failure: Secondary | ICD-10-CM | POA: Diagnosis not present

## 2020-08-25 DIAGNOSIS — E1122 Type 2 diabetes mellitus with diabetic chronic kidney disease: Secondary | ICD-10-CM | POA: Diagnosis not present

## 2020-08-25 DIAGNOSIS — N184 Chronic kidney disease, stage 4 (severe): Secondary | ICD-10-CM | POA: Diagnosis not present

## 2020-08-25 DIAGNOSIS — I110 Hypertensive heart disease with heart failure: Secondary | ICD-10-CM | POA: Diagnosis not present

## 2020-08-25 DIAGNOSIS — D631 Anemia in chronic kidney disease: Secondary | ICD-10-CM | POA: Diagnosis not present

## 2020-08-25 DIAGNOSIS — S42215D Unspecified nondisplaced fracture of surgical neck of left humerus, subsequent encounter for fracture with routine healing: Secondary | ICD-10-CM | POA: Diagnosis not present

## 2020-08-26 DIAGNOSIS — I5032 Chronic diastolic (congestive) heart failure: Secondary | ICD-10-CM | POA: Diagnosis not present

## 2020-08-26 DIAGNOSIS — D631 Anemia in chronic kidney disease: Secondary | ICD-10-CM | POA: Diagnosis not present

## 2020-08-26 DIAGNOSIS — I110 Hypertensive heart disease with heart failure: Secondary | ICD-10-CM | POA: Diagnosis not present

## 2020-08-26 DIAGNOSIS — N184 Chronic kidney disease, stage 4 (severe): Secondary | ICD-10-CM | POA: Diagnosis not present

## 2020-08-26 DIAGNOSIS — E1122 Type 2 diabetes mellitus with diabetic chronic kidney disease: Secondary | ICD-10-CM | POA: Diagnosis not present

## 2020-08-26 DIAGNOSIS — S42215D Unspecified nondisplaced fracture of surgical neck of left humerus, subsequent encounter for fracture with routine healing: Secondary | ICD-10-CM | POA: Diagnosis not present

## 2020-08-31 DIAGNOSIS — S42215D Unspecified nondisplaced fracture of surgical neck of left humerus, subsequent encounter for fracture with routine healing: Secondary | ICD-10-CM | POA: Diagnosis not present

## 2020-08-31 DIAGNOSIS — D631 Anemia in chronic kidney disease: Secondary | ICD-10-CM | POA: Diagnosis not present

## 2020-08-31 DIAGNOSIS — I5032 Chronic diastolic (congestive) heart failure: Secondary | ICD-10-CM | POA: Diagnosis not present

## 2020-08-31 DIAGNOSIS — E1122 Type 2 diabetes mellitus with diabetic chronic kidney disease: Secondary | ICD-10-CM | POA: Diagnosis not present

## 2020-08-31 DIAGNOSIS — I110 Hypertensive heart disease with heart failure: Secondary | ICD-10-CM | POA: Diagnosis not present

## 2020-08-31 DIAGNOSIS — N184 Chronic kidney disease, stage 4 (severe): Secondary | ICD-10-CM | POA: Diagnosis not present

## 2020-09-03 DIAGNOSIS — S42215D Unspecified nondisplaced fracture of surgical neck of left humerus, subsequent encounter for fracture with routine healing: Secondary | ICD-10-CM | POA: Diagnosis not present

## 2020-09-03 DIAGNOSIS — I110 Hypertensive heart disease with heart failure: Secondary | ICD-10-CM | POA: Diagnosis not present

## 2020-09-03 DIAGNOSIS — N184 Chronic kidney disease, stage 4 (severe): Secondary | ICD-10-CM | POA: Diagnosis not present

## 2020-09-03 DIAGNOSIS — D631 Anemia in chronic kidney disease: Secondary | ICD-10-CM | POA: Diagnosis not present

## 2020-09-03 DIAGNOSIS — E1122 Type 2 diabetes mellitus with diabetic chronic kidney disease: Secondary | ICD-10-CM | POA: Diagnosis not present

## 2020-09-03 DIAGNOSIS — I5032 Chronic diastolic (congestive) heart failure: Secondary | ICD-10-CM | POA: Diagnosis not present

## 2020-09-06 ENCOUNTER — Encounter (HOSPITAL_COMMUNITY): Payer: Self-pay | Admitting: Emergency Medicine

## 2020-09-06 ENCOUNTER — Emergency Department (HOSPITAL_COMMUNITY): Payer: Medicare Other

## 2020-09-06 ENCOUNTER — Emergency Department (HOSPITAL_COMMUNITY)
Admission: EM | Admit: 2020-09-06 | Discharge: 2020-09-06 | Disposition: A | Discharge status: Home / Self Care | Payer: Medicare Other | Attending: Emergency Medicine | Admitting: Emergency Medicine

## 2020-09-06 ENCOUNTER — Other Ambulatory Visit: Payer: Self-pay

## 2020-09-06 DIAGNOSIS — E1122 Type 2 diabetes mellitus with diabetic chronic kidney disease: Secondary | ICD-10-CM | POA: Insufficient documentation

## 2020-09-06 DIAGNOSIS — J9811 Atelectasis: Secondary | ICD-10-CM | POA: Diagnosis not present

## 2020-09-06 DIAGNOSIS — E039 Hypothyroidism, unspecified: Secondary | ICD-10-CM | POA: Diagnosis not present

## 2020-09-06 DIAGNOSIS — R42 Dizziness and giddiness: Secondary | ICD-10-CM

## 2020-09-06 DIAGNOSIS — N261 Atrophy of kidney (terminal): Secondary | ICD-10-CM | POA: Diagnosis not present

## 2020-09-06 DIAGNOSIS — I10 Essential (primary) hypertension: Secondary | ICD-10-CM | POA: Diagnosis not present

## 2020-09-06 DIAGNOSIS — N189 Chronic kidney disease, unspecified: Secondary | ICD-10-CM | POA: Diagnosis not present

## 2020-09-06 DIAGNOSIS — J811 Chronic pulmonary edema: Secondary | ICD-10-CM | POA: Diagnosis not present

## 2020-09-06 DIAGNOSIS — Z96651 Presence of right artificial knee joint: Secondary | ICD-10-CM | POA: Diagnosis not present

## 2020-09-06 DIAGNOSIS — R110 Nausea: Secondary | ICD-10-CM | POA: Diagnosis not present

## 2020-09-06 DIAGNOSIS — R1110 Vomiting, unspecified: Secondary | ICD-10-CM | POA: Diagnosis not present

## 2020-09-06 DIAGNOSIS — I509 Heart failure, unspecified: Secondary | ICD-10-CM | POA: Diagnosis not present

## 2020-09-06 DIAGNOSIS — K8020 Calculus of gallbladder without cholecystitis without obstruction: Secondary | ICD-10-CM | POA: Diagnosis not present

## 2020-09-06 DIAGNOSIS — R112 Nausea with vomiting, unspecified: Secondary | ICD-10-CM

## 2020-09-06 DIAGNOSIS — Z20822 Contact with and (suspected) exposure to covid-19: Secondary | ICD-10-CM | POA: Diagnosis not present

## 2020-09-06 DIAGNOSIS — R1111 Vomiting without nausea: Secondary | ICD-10-CM | POA: Diagnosis not present

## 2020-09-06 DIAGNOSIS — I129 Hypertensive chronic kidney disease with stage 1 through stage 4 chronic kidney disease, or unspecified chronic kidney disease: Secondary | ICD-10-CM | POA: Insufficient documentation

## 2020-09-06 DIAGNOSIS — I517 Cardiomegaly: Secondary | ICD-10-CM | POA: Diagnosis not present

## 2020-09-06 LAB — URINALYSIS, ROUTINE W REFLEX MICROSCOPIC
Bilirubin Urine: NEGATIVE
Glucose, UA: NEGATIVE mg/dL
Hgb urine dipstick: NEGATIVE
Ketones, ur: NEGATIVE mg/dL
Leukocytes,Ua: NEGATIVE
Nitrite: NEGATIVE
Protein, ur: 100 mg/dL — AB
Specific Gravity, Urine: 1.016 (ref 1.005–1.030)
pH: 6 (ref 5.0–8.0)

## 2020-09-06 LAB — PROTIME-INR
INR: 1 (ref 0.8–1.2)
Prothrombin Time: 12.9 seconds (ref 11.4–15.2)

## 2020-09-06 LAB — CBC
HCT: 30.5 % — ABNORMAL LOW (ref 36.0–46.0)
Hemoglobin: 8.8 g/dL — ABNORMAL LOW (ref 12.0–15.0)
MCH: 22.8 pg — ABNORMAL LOW (ref 26.0–34.0)
MCHC: 28.9 g/dL — ABNORMAL LOW (ref 30.0–36.0)
MCV: 79 fL — ABNORMAL LOW (ref 80.0–100.0)
Platelets: 204 10*3/uL (ref 150–400)
RBC: 3.86 MIL/uL — ABNORMAL LOW (ref 3.87–5.11)
RDW: 18.3 % — ABNORMAL HIGH (ref 11.5–15.5)
WBC: 7 10*3/uL (ref 4.0–10.5)
nRBC: 0 % (ref 0.0–0.2)

## 2020-09-06 LAB — COMPREHENSIVE METABOLIC PANEL
ALT: 13 U/L (ref 0–44)
AST: 17 U/L (ref 15–41)
Albumin: 3.6 g/dL (ref 3.5–5.0)
Alkaline Phosphatase: 92 U/L (ref 38–126)
Anion gap: 6 (ref 5–15)
BUN: 30 mg/dL — ABNORMAL HIGH (ref 8–23)
CO2: 28 mmol/L (ref 22–32)
Calcium: 8.7 mg/dL — ABNORMAL LOW (ref 8.9–10.3)
Chloride: 102 mmol/L (ref 98–111)
Creatinine, Ser: 1.65 mg/dL — ABNORMAL HIGH (ref 0.44–1.00)
GFR, Estimated: 30 mL/min — ABNORMAL LOW (ref >60)
Glucose, Bld: 232 mg/dL — ABNORMAL HIGH (ref 70–99)
Potassium: 4.1 mmol/L (ref 3.5–5.1)
Sodium: 136 mmol/L (ref 135–145)
Total Bilirubin: 0.6 mg/dL (ref 0.3–1.2)
Total Protein: 7 g/dL (ref 6.5–8.1)

## 2020-09-06 LAB — TROPONIN I (HIGH SENSITIVITY)
Troponin I (High Sensitivity): 7 ng/L (ref <18)
Troponin I (High Sensitivity): 8 ng/L (ref <18)

## 2020-09-06 LAB — RESP PANEL BY RT-PCR (FLU A&B, COVID) ARPGX2
Influenza A by PCR: NEGATIVE
Influenza B by PCR: NEGATIVE
SARS Coronavirus 2 by RT PCR: NEGATIVE

## 2020-09-06 LAB — LIPASE, BLOOD: Lipase: 45 U/L (ref 11–51)

## 2020-09-06 MED ORDER — IOHEXOL 300 MG/ML  SOLN
75.0000 mL | Freq: Once | INTRAMUSCULAR | Status: AC | PRN
  Administered 2020-09-06: 75 mL via INTRAVENOUS

## 2020-09-06 MED ORDER — MECLIZINE HCL 12.5 MG PO TABS
25.0000 mg | ORAL_TABLET | Freq: Once | ORAL | Status: AC
  Administered 2020-09-06: 25 mg via ORAL
  Filled 2020-09-06: qty 2

## 2020-09-06 MED ORDER — ONDANSETRON HCL 4 MG/2ML IJ SOLN
4.0000 mg | Freq: Once | INTRAMUSCULAR | Status: AC
  Administered 2020-09-06: 4 mg via INTRAVENOUS
  Filled 2020-09-06: qty 2

## 2020-09-06 MED ORDER — SODIUM CHLORIDE 0.9 % IV BOLUS
1000.0000 mL | Freq: Once | INTRAVENOUS | Status: AC
  Administered 2020-09-06: 1000 mL via INTRAVENOUS

## 2020-09-06 MED ORDER — MECLIZINE HCL 25 MG PO TABS: 25.0000 mg | ORAL_TABLET | Freq: Three times a day (TID) | ORAL | 0 refills | Status: AC | PRN

## 2020-09-06 NOTE — Discharge Instructions (Addendum)
Take the Antivert as directed.  Make an appointment to follow-up with your doctor as well as neurology.  Return for any new or worse symptoms.  As we discussed stroke as the cause of the vertigo is not completely ruled out.  Plus on CT scan there is evidence of a age-indeterminate lacunar infarct.

## 2020-09-06 NOTE — ED Provider Notes (Signed)
[facility] Emergency Department Provider Note MRN:  [id number]  Arrival date & time: 09/06/20     Chief Complaint   Emesis   History of Present Illness   Catherine Rich is a 87 y.o. year-old female with a history of hypertension, diabetes, stroke, aortic aneurysm presenting to the ED with chief complaint of emesis.  Sudden onset nausea vomiting this evening.  Denies any significant abdominal pain, no chest pain or shortness of breath, denies headache.  Multiple episodes, constant, symptoms severe.  Review of Systems  A complete 10 system review of systems was obtained and all systems are negative except as noted in the HPI and PMH.   Patient's Health History    Past Medical History:  Diagnosis Date   Anemia    Arthritis    Chronic kidney disease    only one funtioning kidney   Complication of anesthesia    Diabetes mellitus without complication (HCC)    GERD (gastroesophageal reflux disease)    Hypertension    Hypothyroidism    Macular degeneration, bilateral    PONV (postoperative nausea and vomiting)    Stroke ([place]) 01/19/2011    Past Surgical History:  Procedure Laterality Date   ABDOMINAL HYSTERECTOMY  1970   BACK SURGERY  01/02/2018   L4-L5    EYE SURGERY     INTRAMEDULLARY (IM) NAIL INTERTROCHANTERIC Right 10/13/2019   Procedure: INTRAMEDULLARY (IM) NAIL INTERTROCHANTRIC;  Surgeon: Rod Can, MD;  Location: [place];  Service: Orthopedics;  Laterality: Right;   TOTAL KNEE ARTHROPLASTY Right 08/02/2018   Procedure: TOTAL KNEE ARTHROPLASTY;  Surgeon: Sydnee Cabal, MD;  Location: [facility];  Service: Orthopedics;  Laterality: Right;  with adductor canal   VASCULAR SURGERY  11/2013   AAA    History reviewed. No pertinent family history.  Social History   Socioeconomic History   Marital status: Widowed    Spouse name: Not on file   Number of children: Not on file   Years of education: Not on file   Highest education level: Not on  file  Occupational History   Occupation: unemployed  Tobacco Use   Smoking status: Never   Smokeless tobacco: Never  Substance and Sexual Activity   Alcohol use: Never   Drug use: Never   Sexual activity: Not Currently  Other Topics Concern   Not on file  Social History Narrative   Not on file   Social Determinants of Health   Financial Resource Strain: Not on file  Food Insecurity: Not on file  Transportation Needs: Not on file  Physical Activity: Not on file  Stress: Not on file  Social Connections: Not on file  Intimate Partner Violence: Not on file     Physical Exam   Vitals:   09/06/20 0400 09/06/20 0430  BP: (!) 166/66 (!) 153/58  Pulse: 76 74  Resp: 15 13  Temp:    SpO2: 100% 100%    CONSTITUTIONAL: Chronically ill-appearing, actively vomiting NEURO:  Alert and oriented x 3, no focal deficits EYES:  eyes equal and reactive ENT/NECK:  no LAD, no JVD CARDIO: Regular rate, well-perfused, normal S1 and S2 PULM:  CTAB no wheezing or rhonchi GI/GU:  normal bowel sounds, non-distended, non-tender MSK/SPINE:  No gross deformities, no edema SKIN:  no rash, atraumatic PSYCH:  Appropriate speech and behavior  *Additional and/or pertinent findings included in MDM below  Diagnostic and Interventional Summary    EKG Interpretation  Date/Time:  Monday September 06 2020 01:46:06 EDT Ventricular  Rate:  80 PR Interval:  212 QRS Duration: 99 QT Interval:  386 QTC Calculation: 446 R Axis:   46 Text Interpretation: Sinus rhythm Borderline prolonged PR interval Confirmed by Gerlene Fee [id number]) on 09/06/2020 2:58:44 AM       Labs Reviewed  CBC - Abnormal; Notable for the following components:      Result Value   RBC 3.86 (*)    Hemoglobin 8.8 (*)    HCT 30.5 (*)    MCV 79.0 (*)    MCH 22.8 (*)    MCHC 28.9 (*)    RDW 18.3 (*)    All other components within normal limits  COMPREHENSIVE METABOLIC PANEL - Abnormal; Notable for the following components:   Glucose,  Bld 232 (*)    BUN 30 (*)    Creatinine, Ser 1.65 (*)    Calcium 8.7 (*)    GFR, Estimated 30 (*)    All other components within normal limits  RESP PANEL BY RT-PCR (FLU A&B, COVID) ARPGX2  LIPASE, BLOOD  PROTIME-INR  URINALYSIS, ROUTINE W REFLEX MICROSCOPIC  TROPONIN I (HIGH SENSITIVITY)  TROPONIN I (HIGH SENSITIVITY)    CT ABDOMEN PELVIS W CONTRAST  Final Result    DG Chest Port 1 View  Final Result    MR BRAIN WO CONTRAST    (Results Pending)    Medications  sodium chloride 0.9 % bolus 1,000 mL (0 mLs Intravenous Stopped 09/06/20 0310)  ondansetron (ZOFRAN) injection 4 mg (4 mg Intravenous Given 09/06/20 0137)  iohexol (OMNIPAQUE) 300 MG/ML solution 75 mL (75 mLs Intravenous Contrast Given 09/06/20 0239)     Procedures  /  Critical Care Procedures  ED Course and Medical Decision Making  I have reviewed the triage vital signs, the nursing notes, and pertinent available records from the EMR.  Listed above are laboratory and imaging tests that I personally ordered, reviewed, and interpreted and then considered in my medical decision making (see below for details).  Considering intra-abdominal obstruction, GI illness, metabolic disarray.  Awaiting labs, CT imaging.  Patient's symptoms are resolved, she looks much better.  Work-up is overall reassuring.  Extensive vascular history found on CT without significant changes.  There is cholelithiasis but no evidence of cholecystitis.  Patient has no right upper quadrant tenderness on exam, no fever, no laboratory abnormalities to suggest cholecystitis.  Further history from patient obtained, she was experiencing severe vertigo during her vomiting, likely explaining the vomiting.  She currently has a completely normal neurological exam, normal and symmetric strength and sensation, normal coordination, normal speech.  She had a normal neurological exam on my initial assessment as well.  She has a history of benign vertigo but no episodes for  a few years.  Given her cardiovascular history, will obtain MRI to exclude signs of stroke.  If normal would be appropriate for discharge.  Signed out to oncoming provider at shift change.       Barth Kirks. Sedonia Small, MD [place] [email or web address]  Final Clinical Impressions(s) / ED Diagnoses     ICD-10-CM   1. Vertigo  R42     2. Non-intractable vomiting with nausea, unspecified vomiting type  R11.2       ED Discharge Orders     None        Discharge Instructions Discussed with and Provided to Patient:   Discharge Instructions   None       Maudie Flakes, MD 09/06/20 [phone]

## 2020-09-06 NOTE — Plan of Care (Signed)
Discussed care with Dr. Rogene Houston, recommended HINTS exam and Dix-Hallpike to help clarify peripheral versus central symptoms given inability to obtain MRI and head CT with age-indeterminate pontine lesion.  Per Dr. Rogene Houston patient is asymptomatic laying in bed at this time.  Additionally noted Dr. Merlene Laughter would also be available to assist if process not clearly peripheral on ED examination.  Lesleigh Noe MD-PhD [facility] [phone] [facility] coverage for [facility] is from 8 AM to 4 AM in-house and 4 PM to 8 PM by telephone/video. 8 PM to 8 AM emergent questions or overnight urgent questions should be addressed to Teleneurology On-call or [facility] neurohospitalist; contact information can be found on AMION

## 2020-09-06 NOTE — ED Provider Notes (Addendum)
MRI not able to clear her for the MRI scan of her brain to rule out stroke.  They were able to clear some of the concerns they had about her stents.  But were not able to completely clear him.  Patient overall feeling better still having some dizziness.  We will go ahead and do CT head give some Antivert.  Patient's had vertigo in the past.  If head CT negative will call her today and patient understands that were not able to completely rule out stroke.  She will go back to follow-up with her specialist to see if MRI is available.    Fredia Sorrow, MD 09/06/20 1305  Patient's head CT raise concerns about lacunar infarct age undetermined.  Discussed with teleneurology.  They recommended trying to sort out whether central versus peripheral causes of vertigo.  Patient had already received Antivert symptoms fairly asymptomatic.  But was able to elicit some horizontal nystagmus.  Also patient's neuro exam without any other significant abnormalities.  Clinically the onset was sudden and it was intense spinning when it occurred it is now resolved.  And again no other neuro findings at all.  Not able to elicit the intense spinning.  With moving head left to right.  Also the Dix-Hallpike findings were nonspecific.  All this could mean it could be central.  But clinically I think it is most likely peripheral.  Patient does not want to be admitted she wants to go home we will give her referral to neurology for outpatient follow-up she will return for any new or worse symptoms.  We will send her home with Antivert.  Patient understands the risk.    Fredia Sorrow, MD 09/06/20 [phone]

## 2020-09-06 NOTE — ED Triage Notes (Signed)
Pt c/o nausea and vomiting since about 2000. Pt denies any other symptoms. Pt received 4mg  IM zofran.

## 2020-09-07 DIAGNOSIS — D631 Anemia in chronic kidney disease: Secondary | ICD-10-CM | POA: Diagnosis not present

## 2020-09-07 DIAGNOSIS — I110 Hypertensive heart disease with heart failure: Secondary | ICD-10-CM | POA: Diagnosis not present

## 2020-09-07 DIAGNOSIS — N184 Chronic kidney disease, stage 4 (severe): Secondary | ICD-10-CM | POA: Diagnosis not present

## 2020-09-07 DIAGNOSIS — E1122 Type 2 diabetes mellitus with diabetic chronic kidney disease: Secondary | ICD-10-CM | POA: Diagnosis not present

## 2020-09-07 DIAGNOSIS — S42215D Unspecified nondisplaced fracture of surgical neck of left humerus, subsequent encounter for fracture with routine healing: Secondary | ICD-10-CM | POA: Diagnosis not present

## 2020-09-07 DIAGNOSIS — I5032 Chronic diastolic (congestive) heart failure: Secondary | ICD-10-CM | POA: Diagnosis not present

## 2020-09-08 DIAGNOSIS — I5032 Chronic diastolic (congestive) heart failure: Secondary | ICD-10-CM | POA: Diagnosis not present

## 2020-09-08 DIAGNOSIS — I110 Hypertensive heart disease with heart failure: Secondary | ICD-10-CM | POA: Diagnosis not present

## 2020-09-08 DIAGNOSIS — S42215D Unspecified nondisplaced fracture of surgical neck of left humerus, subsequent encounter for fracture with routine healing: Secondary | ICD-10-CM | POA: Diagnosis not present

## 2020-09-08 DIAGNOSIS — E1122 Type 2 diabetes mellitus with diabetic chronic kidney disease: Secondary | ICD-10-CM | POA: Diagnosis not present

## 2020-09-08 DIAGNOSIS — H8110 Benign paroxysmal vertigo, unspecified ear: Secondary | ICD-10-CM | POA: Diagnosis not present

## 2020-09-08 DIAGNOSIS — D631 Anemia in chronic kidney disease: Secondary | ICD-10-CM | POA: Diagnosis not present

## 2020-09-08 DIAGNOSIS — N184 Chronic kidney disease, stage 4 (severe): Secondary | ICD-10-CM | POA: Diagnosis not present

## 2020-09-09 DIAGNOSIS — S42215D Unspecified nondisplaced fracture of surgical neck of left humerus, subsequent encounter for fracture with routine healing: Secondary | ICD-10-CM | POA: Diagnosis not present

## 2020-09-09 DIAGNOSIS — I110 Hypertensive heart disease with heart failure: Secondary | ICD-10-CM | POA: Diagnosis not present

## 2020-09-09 DIAGNOSIS — N184 Chronic kidney disease, stage 4 (severe): Secondary | ICD-10-CM | POA: Diagnosis not present

## 2020-09-09 DIAGNOSIS — I5032 Chronic diastolic (congestive) heart failure: Secondary | ICD-10-CM | POA: Diagnosis not present

## 2020-09-09 DIAGNOSIS — E1122 Type 2 diabetes mellitus with diabetic chronic kidney disease: Secondary | ICD-10-CM | POA: Diagnosis not present

## 2020-09-09 DIAGNOSIS — D631 Anemia in chronic kidney disease: Secondary | ICD-10-CM | POA: Diagnosis not present

## 2020-09-13 DIAGNOSIS — E1122 Type 2 diabetes mellitus with diabetic chronic kidney disease: Secondary | ICD-10-CM | POA: Diagnosis not present

## 2020-09-13 DIAGNOSIS — M25532 Pain in left wrist: Secondary | ICD-10-CM | POA: Diagnosis not present

## 2020-09-13 DIAGNOSIS — M25512 Pain in left shoulder: Secondary | ICD-10-CM | POA: Diagnosis not present

## 2020-09-13 DIAGNOSIS — D631 Anemia in chronic kidney disease: Secondary | ICD-10-CM | POA: Diagnosis not present

## 2020-09-13 DIAGNOSIS — S42215D Unspecified nondisplaced fracture of surgical neck of left humerus, subsequent encounter for fracture with routine healing: Secondary | ICD-10-CM | POA: Diagnosis not present

## 2020-09-13 DIAGNOSIS — I5032 Chronic diastolic (congestive) heart failure: Secondary | ICD-10-CM | POA: Diagnosis not present

## 2020-09-13 DIAGNOSIS — I110 Hypertensive heart disease with heart failure: Secondary | ICD-10-CM | POA: Diagnosis not present

## 2020-09-13 DIAGNOSIS — N184 Chronic kidney disease, stage 4 (severe): Secondary | ICD-10-CM | POA: Diagnosis not present

## 2020-09-14 DIAGNOSIS — S42215D Unspecified nondisplaced fracture of surgical neck of left humerus, subsequent encounter for fracture with routine healing: Secondary | ICD-10-CM | POA: Diagnosis not present

## 2020-09-14 DIAGNOSIS — I110 Hypertensive heart disease with heart failure: Secondary | ICD-10-CM | POA: Diagnosis not present

## 2020-09-14 DIAGNOSIS — E1122 Type 2 diabetes mellitus with diabetic chronic kidney disease: Secondary | ICD-10-CM | POA: Diagnosis not present

## 2020-09-14 DIAGNOSIS — I5032 Chronic diastolic (congestive) heart failure: Secondary | ICD-10-CM | POA: Diagnosis not present

## 2020-09-14 DIAGNOSIS — N184 Chronic kidney disease, stage 4 (severe): Secondary | ICD-10-CM | POA: Diagnosis not present

## 2020-09-14 DIAGNOSIS — D631 Anemia in chronic kidney disease: Secondary | ICD-10-CM | POA: Diagnosis not present

## 2020-09-16 DIAGNOSIS — S42215D Unspecified nondisplaced fracture of surgical neck of left humerus, subsequent encounter for fracture with routine healing: Secondary | ICD-10-CM | POA: Diagnosis not present

## 2020-09-16 DIAGNOSIS — I5032 Chronic diastolic (congestive) heart failure: Secondary | ICD-10-CM | POA: Diagnosis not present

## 2020-09-16 DIAGNOSIS — D631 Anemia in chronic kidney disease: Secondary | ICD-10-CM | POA: Diagnosis not present

## 2020-09-16 DIAGNOSIS — N184 Chronic kidney disease, stage 4 (severe): Secondary | ICD-10-CM | POA: Diagnosis not present

## 2020-09-16 DIAGNOSIS — E1122 Type 2 diabetes mellitus with diabetic chronic kidney disease: Secondary | ICD-10-CM | POA: Diagnosis not present

## 2020-09-16 DIAGNOSIS — I110 Hypertensive heart disease with heart failure: Secondary | ICD-10-CM | POA: Diagnosis not present

## 2020-09-19 DIAGNOSIS — E7849 Other hyperlipidemia: Secondary | ICD-10-CM | POA: Diagnosis not present

## 2020-09-19 DIAGNOSIS — D692 Other nonthrombocytopenic purpura: Secondary | ICD-10-CM | POA: Diagnosis not present

## 2020-09-19 DIAGNOSIS — E1165 Type 2 diabetes mellitus with hyperglycemia: Secondary | ICD-10-CM | POA: Diagnosis not present

## 2020-09-19 DIAGNOSIS — I10 Essential (primary) hypertension: Secondary | ICD-10-CM | POA: Diagnosis not present

## 2020-09-20 DIAGNOSIS — E063 Autoimmune thyroiditis: Secondary | ICD-10-CM | POA: Diagnosis not present

## 2020-09-20 DIAGNOSIS — E785 Hyperlipidemia, unspecified: Secondary | ICD-10-CM | POA: Diagnosis not present

## 2020-09-20 DIAGNOSIS — I5032 Chronic diastolic (congestive) heart failure: Secondary | ICD-10-CM | POA: Diagnosis not present

## 2020-09-20 DIAGNOSIS — Z9981 Dependence on supplemental oxygen: Secondary | ICD-10-CM | POA: Diagnosis not present

## 2020-09-20 DIAGNOSIS — Z7982 Long term (current) use of aspirin: Secondary | ICD-10-CM | POA: Diagnosis not present

## 2020-09-20 DIAGNOSIS — Z7984 Long term (current) use of oral hypoglycemic drugs: Secondary | ICD-10-CM | POA: Diagnosis not present

## 2020-09-20 DIAGNOSIS — N184 Chronic kidney disease, stage 4 (severe): Secondary | ICD-10-CM | POA: Diagnosis not present

## 2020-09-20 DIAGNOSIS — F419 Anxiety disorder, unspecified: Secondary | ICD-10-CM | POA: Diagnosis not present

## 2020-09-20 DIAGNOSIS — K5900 Constipation, unspecified: Secondary | ICD-10-CM | POA: Diagnosis not present

## 2020-09-20 DIAGNOSIS — I110 Hypertensive heart disease with heart failure: Secondary | ICD-10-CM | POA: Diagnosis not present

## 2020-09-20 DIAGNOSIS — L570 Actinic keratosis: Secondary | ICD-10-CM | POA: Diagnosis not present

## 2020-09-20 DIAGNOSIS — E039 Hypothyroidism, unspecified: Secondary | ICD-10-CM | POA: Diagnosis not present

## 2020-09-20 DIAGNOSIS — Z85828 Personal history of other malignant neoplasm of skin: Secondary | ICD-10-CM | POA: Diagnosis not present

## 2020-09-20 DIAGNOSIS — E1151 Type 2 diabetes mellitus with diabetic peripheral angiopathy without gangrene: Secondary | ICD-10-CM | POA: Diagnosis not present

## 2020-09-20 DIAGNOSIS — D631 Anemia in chronic kidney disease: Secondary | ICD-10-CM | POA: Diagnosis not present

## 2020-09-20 DIAGNOSIS — E1122 Type 2 diabetes mellitus with diabetic chronic kidney disease: Secondary | ICD-10-CM | POA: Diagnosis not present

## 2020-09-20 DIAGNOSIS — J449 Chronic obstructive pulmonary disease, unspecified: Secondary | ICD-10-CM | POA: Diagnosis not present

## 2020-09-20 DIAGNOSIS — G894 Chronic pain syndrome: Secondary | ICD-10-CM | POA: Diagnosis not present

## 2020-09-20 DIAGNOSIS — I716 Thoracoabdominal aortic aneurysm, without rupture: Secondary | ICD-10-CM | POA: Diagnosis not present

## 2020-09-20 DIAGNOSIS — M47816 Spondylosis without myelopathy or radiculopathy, lumbar region: Secondary | ICD-10-CM | POA: Diagnosis not present

## 2020-09-20 DIAGNOSIS — J9611 Chronic respiratory failure with hypoxia: Secondary | ICD-10-CM | POA: Diagnosis not present

## 2020-09-20 DIAGNOSIS — Z9181 History of falling: Secondary | ICD-10-CM | POA: Diagnosis not present

## 2020-09-20 DIAGNOSIS — K219 Gastro-esophageal reflux disease without esophagitis: Secondary | ICD-10-CM | POA: Diagnosis not present

## 2020-09-20 DIAGNOSIS — S42215D Unspecified nondisplaced fracture of surgical neck of left humerus, subsequent encounter for fracture with routine healing: Secondary | ICD-10-CM | POA: Diagnosis not present

## 2020-09-20 DIAGNOSIS — E222 Syndrome of inappropriate secretion of antidiuretic hormone: Secondary | ICD-10-CM | POA: Diagnosis not present

## 2020-09-20 DIAGNOSIS — E1142 Type 2 diabetes mellitus with diabetic polyneuropathy: Secondary | ICD-10-CM | POA: Diagnosis not present

## 2020-09-21 DIAGNOSIS — S42215D Unspecified nondisplaced fracture of surgical neck of left humerus, subsequent encounter for fracture with routine healing: Secondary | ICD-10-CM | POA: Diagnosis not present

## 2020-09-21 DIAGNOSIS — D631 Anemia in chronic kidney disease: Secondary | ICD-10-CM | POA: Diagnosis not present

## 2020-09-21 DIAGNOSIS — I110 Hypertensive heart disease with heart failure: Secondary | ICD-10-CM | POA: Diagnosis not present

## 2020-09-21 DIAGNOSIS — E1122 Type 2 diabetes mellitus with diabetic chronic kidney disease: Secondary | ICD-10-CM | POA: Diagnosis not present

## 2020-09-21 DIAGNOSIS — I5032 Chronic diastolic (congestive) heart failure: Secondary | ICD-10-CM | POA: Diagnosis not present

## 2020-09-21 DIAGNOSIS — N184 Chronic kidney disease, stage 4 (severe): Secondary | ICD-10-CM | POA: Diagnosis not present

## 2020-09-23 DIAGNOSIS — D529 Folate deficiency anemia, unspecified: Secondary | ICD-10-CM | POA: Diagnosis not present

## 2020-09-23 DIAGNOSIS — D519 Vitamin B12 deficiency anemia, unspecified: Secondary | ICD-10-CM | POA: Diagnosis not present

## 2020-09-23 DIAGNOSIS — K2100 Gastro-esophageal reflux disease with esophagitis, without bleeding: Secondary | ICD-10-CM | POA: Diagnosis not present

## 2020-09-23 DIAGNOSIS — E039 Hypothyroidism, unspecified: Secondary | ICD-10-CM | POA: Diagnosis not present

## 2020-09-23 DIAGNOSIS — D649 Anemia, unspecified: Secondary | ICD-10-CM | POA: Diagnosis not present

## 2020-09-23 DIAGNOSIS — E1122 Type 2 diabetes mellitus with diabetic chronic kidney disease: Secondary | ICD-10-CM | POA: Diagnosis not present

## 2020-09-23 DIAGNOSIS — E782 Mixed hyperlipidemia: Secondary | ICD-10-CM | POA: Diagnosis not present

## 2020-09-23 DIAGNOSIS — E7849 Other hyperlipidemia: Secondary | ICD-10-CM | POA: Diagnosis not present

## 2020-09-27 DIAGNOSIS — L97919 Non-pressure chronic ulcer of unspecified part of right lower leg with unspecified severity: Secondary | ICD-10-CM | POA: Diagnosis not present

## 2020-09-27 DIAGNOSIS — J9611 Chronic respiratory failure with hypoxia: Secondary | ICD-10-CM | POA: Diagnosis not present

## 2020-09-27 DIAGNOSIS — E7849 Other hyperlipidemia: Secondary | ICD-10-CM | POA: Diagnosis not present

## 2020-09-27 DIAGNOSIS — R4582 Worries: Secondary | ICD-10-CM | POA: Diagnosis not present

## 2020-09-27 DIAGNOSIS — D500 Iron deficiency anemia secondary to blood loss (chronic): Secondary | ICD-10-CM | POA: Diagnosis not present

## 2020-09-27 DIAGNOSIS — E1122 Type 2 diabetes mellitus with diabetic chronic kidney disease: Secondary | ICD-10-CM | POA: Diagnosis not present

## 2020-09-27 DIAGNOSIS — J449 Chronic obstructive pulmonary disease, unspecified: Secondary | ICD-10-CM | POA: Diagnosis not present

## 2020-09-27 DIAGNOSIS — I10 Essential (primary) hypertension: Secondary | ICD-10-CM | POA: Diagnosis not present

## 2020-09-30 DIAGNOSIS — I110 Hypertensive heart disease with heart failure: Secondary | ICD-10-CM | POA: Diagnosis not present

## 2020-09-30 DIAGNOSIS — S42215D Unspecified nondisplaced fracture of surgical neck of left humerus, subsequent encounter for fracture with routine healing: Secondary | ICD-10-CM | POA: Diagnosis not present

## 2020-09-30 DIAGNOSIS — E1122 Type 2 diabetes mellitus with diabetic chronic kidney disease: Secondary | ICD-10-CM | POA: Diagnosis not present

## 2020-09-30 DIAGNOSIS — D631 Anemia in chronic kidney disease: Secondary | ICD-10-CM | POA: Diagnosis not present

## 2020-09-30 DIAGNOSIS — I5032 Chronic diastolic (congestive) heart failure: Secondary | ICD-10-CM | POA: Diagnosis not present

## 2020-09-30 DIAGNOSIS — N184 Chronic kidney disease, stage 4 (severe): Secondary | ICD-10-CM | POA: Diagnosis not present

## 2020-10-05 DIAGNOSIS — I110 Hypertensive heart disease with heart failure: Secondary | ICD-10-CM | POA: Diagnosis not present

## 2020-10-05 DIAGNOSIS — E1142 Type 2 diabetes mellitus with diabetic polyneuropathy: Secondary | ICD-10-CM | POA: Diagnosis not present

## 2020-10-05 DIAGNOSIS — E1122 Type 2 diabetes mellitus with diabetic chronic kidney disease: Secondary | ICD-10-CM | POA: Diagnosis not present

## 2020-10-05 DIAGNOSIS — D631 Anemia in chronic kidney disease: Secondary | ICD-10-CM | POA: Diagnosis not present

## 2020-10-05 DIAGNOSIS — N184 Chronic kidney disease, stage 4 (severe): Secondary | ICD-10-CM | POA: Diagnosis not present

## 2020-10-05 DIAGNOSIS — I5032 Chronic diastolic (congestive) heart failure: Secondary | ICD-10-CM | POA: Diagnosis not present

## 2020-10-05 DIAGNOSIS — E1151 Type 2 diabetes mellitus with diabetic peripheral angiopathy without gangrene: Secondary | ICD-10-CM | POA: Diagnosis not present

## 2020-10-05 DIAGNOSIS — J449 Chronic obstructive pulmonary disease, unspecified: Secondary | ICD-10-CM | POA: Diagnosis not present

## 2020-10-05 DIAGNOSIS — S42215D Unspecified nondisplaced fracture of surgical neck of left humerus, subsequent encounter for fracture with routine healing: Secondary | ICD-10-CM | POA: Diagnosis not present

## 2020-10-07 DIAGNOSIS — E1122 Type 2 diabetes mellitus with diabetic chronic kidney disease: Secondary | ICD-10-CM | POA: Diagnosis not present

## 2020-10-07 DIAGNOSIS — S42215D Unspecified nondisplaced fracture of surgical neck of left humerus, subsequent encounter for fracture with routine healing: Secondary | ICD-10-CM | POA: Diagnosis not present

## 2020-10-07 DIAGNOSIS — N184 Chronic kidney disease, stage 4 (severe): Secondary | ICD-10-CM | POA: Diagnosis not present

## 2020-10-07 DIAGNOSIS — D631 Anemia in chronic kidney disease: Secondary | ICD-10-CM | POA: Diagnosis not present

## 2020-10-07 DIAGNOSIS — I110 Hypertensive heart disease with heart failure: Secondary | ICD-10-CM | POA: Diagnosis not present

## 2020-10-07 DIAGNOSIS — I5032 Chronic diastolic (congestive) heart failure: Secondary | ICD-10-CM | POA: Diagnosis not present

## 2020-10-12 DIAGNOSIS — S42215D Unspecified nondisplaced fracture of surgical neck of left humerus, subsequent encounter for fracture with routine healing: Secondary | ICD-10-CM | POA: Diagnosis not present

## 2020-10-12 DIAGNOSIS — N184 Chronic kidney disease, stage 4 (severe): Secondary | ICD-10-CM | POA: Diagnosis not present

## 2020-10-12 DIAGNOSIS — E1122 Type 2 diabetes mellitus with diabetic chronic kidney disease: Secondary | ICD-10-CM | POA: Diagnosis not present

## 2020-10-12 DIAGNOSIS — I5032 Chronic diastolic (congestive) heart failure: Secondary | ICD-10-CM | POA: Diagnosis not present

## 2020-10-12 DIAGNOSIS — I110 Hypertensive heart disease with heart failure: Secondary | ICD-10-CM | POA: Diagnosis not present

## 2020-10-12 DIAGNOSIS — D631 Anemia in chronic kidney disease: Secondary | ICD-10-CM | POA: Diagnosis not present

## 2020-10-14 DIAGNOSIS — N184 Chronic kidney disease, stage 4 (severe): Secondary | ICD-10-CM | POA: Diagnosis not present

## 2020-10-14 DIAGNOSIS — D631 Anemia in chronic kidney disease: Secondary | ICD-10-CM | POA: Diagnosis not present

## 2020-10-14 DIAGNOSIS — I110 Hypertensive heart disease with heart failure: Secondary | ICD-10-CM | POA: Diagnosis not present

## 2020-10-14 DIAGNOSIS — S42215D Unspecified nondisplaced fracture of surgical neck of left humerus, subsequent encounter for fracture with routine healing: Secondary | ICD-10-CM | POA: Diagnosis not present

## 2020-10-14 DIAGNOSIS — I5032 Chronic diastolic (congestive) heart failure: Secondary | ICD-10-CM | POA: Diagnosis not present

## 2020-10-14 DIAGNOSIS — E1122 Type 2 diabetes mellitus with diabetic chronic kidney disease: Secondary | ICD-10-CM | POA: Diagnosis not present

## 2020-10-15 DIAGNOSIS — D631 Anemia in chronic kidney disease: Secondary | ICD-10-CM | POA: Diagnosis not present

## 2020-10-15 DIAGNOSIS — E1122 Type 2 diabetes mellitus with diabetic chronic kidney disease: Secondary | ICD-10-CM | POA: Diagnosis not present

## 2020-10-15 DIAGNOSIS — N184 Chronic kidney disease, stage 4 (severe): Secondary | ICD-10-CM | POA: Diagnosis not present

## 2020-10-15 DIAGNOSIS — S42215D Unspecified nondisplaced fracture of surgical neck of left humerus, subsequent encounter for fracture with routine healing: Secondary | ICD-10-CM | POA: Diagnosis not present

## 2020-10-15 DIAGNOSIS — I110 Hypertensive heart disease with heart failure: Secondary | ICD-10-CM | POA: Diagnosis not present

## 2020-10-15 DIAGNOSIS — I5032 Chronic diastolic (congestive) heart failure: Secondary | ICD-10-CM | POA: Diagnosis not present

## 2020-10-17 DIAGNOSIS — J441 Chronic obstructive pulmonary disease with (acute) exacerbation: Secondary | ICD-10-CM | POA: Diagnosis not present

## 2020-10-19 DIAGNOSIS — R519 Headache, unspecified: Secondary | ICD-10-CM | POA: Diagnosis not present

## 2020-10-19 DIAGNOSIS — Z20828 Contact with and (suspected) exposure to other viral communicable diseases: Secondary | ICD-10-CM | POA: Diagnosis not present

## 2020-11-01 DIAGNOSIS — H353211 Exudative age-related macular degeneration, right eye, with active choroidal neovascularization: Secondary | ICD-10-CM | POA: Diagnosis not present

## 2020-11-06 DIAGNOSIS — Z9981 Dependence on supplemental oxygen: Secondary | ICD-10-CM | POA: Diagnosis not present

## 2020-11-06 DIAGNOSIS — Z9181 History of falling: Secondary | ICD-10-CM | POA: Diagnosis not present

## 2020-11-06 DIAGNOSIS — R531 Weakness: Secondary | ICD-10-CM | POA: Diagnosis not present

## 2020-11-06 DIAGNOSIS — Z7984 Long term (current) use of oral hypoglycemic drugs: Secondary | ICD-10-CM | POA: Diagnosis not present

## 2020-11-06 DIAGNOSIS — J441 Chronic obstructive pulmonary disease with (acute) exacerbation: Secondary | ICD-10-CM | POA: Diagnosis not present

## 2020-11-06 DIAGNOSIS — Z8616 Personal history of COVID-19: Secondary | ICD-10-CM | POA: Diagnosis not present

## 2020-11-09 DIAGNOSIS — R531 Weakness: Secondary | ICD-10-CM | POA: Diagnosis not present

## 2020-11-09 DIAGNOSIS — J441 Chronic obstructive pulmonary disease with (acute) exacerbation: Secondary | ICD-10-CM | POA: Diagnosis not present

## 2020-11-09 DIAGNOSIS — Z8616 Personal history of COVID-19: Secondary | ICD-10-CM | POA: Diagnosis not present

## 2020-11-09 DIAGNOSIS — Z7984 Long term (current) use of oral hypoglycemic drugs: Secondary | ICD-10-CM | POA: Diagnosis not present

## 2020-11-09 DIAGNOSIS — Z9181 History of falling: Secondary | ICD-10-CM | POA: Diagnosis not present

## 2020-11-09 DIAGNOSIS — Z9981 Dependence on supplemental oxygen: Secondary | ICD-10-CM | POA: Diagnosis not present

## 2020-11-10 DIAGNOSIS — J329 Chronic sinusitis, unspecified: Secondary | ICD-10-CM | POA: Diagnosis not present

## 2020-11-11 DIAGNOSIS — J441 Chronic obstructive pulmonary disease with (acute) exacerbation: Secondary | ICD-10-CM | POA: Diagnosis not present

## 2020-11-11 DIAGNOSIS — R531 Weakness: Secondary | ICD-10-CM | POA: Diagnosis not present

## 2020-11-11 DIAGNOSIS — Z9181 History of falling: Secondary | ICD-10-CM | POA: Diagnosis not present

## 2020-11-11 DIAGNOSIS — Z7984 Long term (current) use of oral hypoglycemic drugs: Secondary | ICD-10-CM | POA: Diagnosis not present

## 2020-11-11 DIAGNOSIS — Z8616 Personal history of COVID-19: Secondary | ICD-10-CM | POA: Diagnosis not present

## 2020-11-11 DIAGNOSIS — Z9981 Dependence on supplemental oxygen: Secondary | ICD-10-CM | POA: Diagnosis not present

## 2020-11-16 DIAGNOSIS — Z9181 History of falling: Secondary | ICD-10-CM | POA: Diagnosis not present

## 2020-11-16 DIAGNOSIS — J441 Chronic obstructive pulmonary disease with (acute) exacerbation: Secondary | ICD-10-CM | POA: Diagnosis not present

## 2020-11-16 DIAGNOSIS — Z8616 Personal history of COVID-19: Secondary | ICD-10-CM | POA: Diagnosis not present

## 2020-11-16 DIAGNOSIS — R531 Weakness: Secondary | ICD-10-CM | POA: Diagnosis not present

## 2020-11-16 DIAGNOSIS — Z7984 Long term (current) use of oral hypoglycemic drugs: Secondary | ICD-10-CM | POA: Diagnosis not present

## 2020-11-16 DIAGNOSIS — Z9981 Dependence on supplemental oxygen: Secondary | ICD-10-CM | POA: Diagnosis not present

## 2020-11-17 DIAGNOSIS — K7689 Other specified diseases of liver: Secondary | ICD-10-CM | POA: Diagnosis not present

## 2020-11-17 DIAGNOSIS — R17 Unspecified jaundice: Secondary | ICD-10-CM | POA: Diagnosis not present

## 2020-11-17 DIAGNOSIS — H159 Unspecified disorder of sclera: Secondary | ICD-10-CM | POA: Diagnosis not present

## 2020-11-17 DIAGNOSIS — H353221 Exudative age-related macular degeneration, left eye, with active choroidal neovascularization: Secondary | ICD-10-CM | POA: Diagnosis not present

## 2020-11-18 DIAGNOSIS — Z8616 Personal history of COVID-19: Secondary | ICD-10-CM | POA: Diagnosis not present

## 2020-11-18 DIAGNOSIS — Z9181 History of falling: Secondary | ICD-10-CM | POA: Diagnosis not present

## 2020-11-18 DIAGNOSIS — J441 Chronic obstructive pulmonary disease with (acute) exacerbation: Secondary | ICD-10-CM | POA: Diagnosis not present

## 2020-11-18 DIAGNOSIS — Z7984 Long term (current) use of oral hypoglycemic drugs: Secondary | ICD-10-CM | POA: Diagnosis not present

## 2020-11-18 DIAGNOSIS — Z9981 Dependence on supplemental oxygen: Secondary | ICD-10-CM | POA: Diagnosis not present

## 2020-11-18 DIAGNOSIS — R531 Weakness: Secondary | ICD-10-CM | POA: Diagnosis not present

## 2020-11-19 DIAGNOSIS — I10 Essential (primary) hypertension: Secondary | ICD-10-CM | POA: Diagnosis not present

## 2020-11-19 DIAGNOSIS — E1122 Type 2 diabetes mellitus with diabetic chronic kidney disease: Secondary | ICD-10-CM | POA: Diagnosis not present

## 2020-11-19 DIAGNOSIS — N184 Chronic kidney disease, stage 4 (severe): Secondary | ICD-10-CM | POA: Diagnosis not present

## 2020-11-19 DIAGNOSIS — K2100 Gastro-esophageal reflux disease with esophagitis, without bleeding: Secondary | ICD-10-CM | POA: Diagnosis not present

## 2020-11-23 DIAGNOSIS — Z9181 History of falling: Secondary | ICD-10-CM | POA: Diagnosis not present

## 2020-11-23 DIAGNOSIS — J441 Chronic obstructive pulmonary disease with (acute) exacerbation: Secondary | ICD-10-CM | POA: Diagnosis not present

## 2020-11-23 DIAGNOSIS — R531 Weakness: Secondary | ICD-10-CM | POA: Diagnosis not present

## 2020-11-23 DIAGNOSIS — Z7984 Long term (current) use of oral hypoglycemic drugs: Secondary | ICD-10-CM | POA: Diagnosis not present

## 2020-11-23 DIAGNOSIS — Z9981 Dependence on supplemental oxygen: Secondary | ICD-10-CM | POA: Diagnosis not present

## 2020-11-23 DIAGNOSIS — Z8616 Personal history of COVID-19: Secondary | ICD-10-CM | POA: Diagnosis not present

## 2020-11-24 DIAGNOSIS — Z7984 Long term (current) use of oral hypoglycemic drugs: Secondary | ICD-10-CM | POA: Diagnosis not present

## 2020-11-24 DIAGNOSIS — J441 Chronic obstructive pulmonary disease with (acute) exacerbation: Secondary | ICD-10-CM | POA: Diagnosis not present

## 2020-11-24 DIAGNOSIS — R531 Weakness: Secondary | ICD-10-CM | POA: Diagnosis not present

## 2020-11-24 DIAGNOSIS — Z9181 History of falling: Secondary | ICD-10-CM | POA: Diagnosis not present

## 2020-11-24 DIAGNOSIS — G459 Transient cerebral ischemic attack, unspecified: Secondary | ICD-10-CM | POA: Diagnosis not present

## 2020-11-24 DIAGNOSIS — Z8616 Personal history of COVID-19: Secondary | ICD-10-CM | POA: Diagnosis not present

## 2020-11-24 DIAGNOSIS — Z9981 Dependence on supplemental oxygen: Secondary | ICD-10-CM | POA: Diagnosis not present

## 2020-11-25 DIAGNOSIS — Z8616 Personal history of COVID-19: Secondary | ICD-10-CM | POA: Diagnosis not present

## 2020-11-25 DIAGNOSIS — R531 Weakness: Secondary | ICD-10-CM | POA: Diagnosis not present

## 2020-11-25 DIAGNOSIS — Z9981 Dependence on supplemental oxygen: Secondary | ICD-10-CM | POA: Diagnosis not present

## 2020-11-25 DIAGNOSIS — Z9181 History of falling: Secondary | ICD-10-CM | POA: Diagnosis not present

## 2020-11-25 DIAGNOSIS — Z7984 Long term (current) use of oral hypoglycemic drugs: Secondary | ICD-10-CM | POA: Diagnosis not present

## 2020-11-25 DIAGNOSIS — J441 Chronic obstructive pulmonary disease with (acute) exacerbation: Secondary | ICD-10-CM | POA: Diagnosis not present

## 2020-11-26 DIAGNOSIS — N183 Chronic kidney disease, stage 3 unspecified: Secondary | ICD-10-CM | POA: Diagnosis not present

## 2020-11-26 DIAGNOSIS — E875 Hyperkalemia: Secondary | ICD-10-CM | POA: Diagnosis not present

## 2020-11-30 DIAGNOSIS — J441 Chronic obstructive pulmonary disease with (acute) exacerbation: Secondary | ICD-10-CM | POA: Diagnosis not present

## 2020-11-30 DIAGNOSIS — Z9181 History of falling: Secondary | ICD-10-CM | POA: Diagnosis not present

## 2020-11-30 DIAGNOSIS — R531 Weakness: Secondary | ICD-10-CM | POA: Diagnosis not present

## 2020-11-30 DIAGNOSIS — Z8616 Personal history of COVID-19: Secondary | ICD-10-CM | POA: Diagnosis not present

## 2020-11-30 DIAGNOSIS — Z9981 Dependence on supplemental oxygen: Secondary | ICD-10-CM | POA: Diagnosis not present

## 2020-11-30 DIAGNOSIS — Z7984 Long term (current) use of oral hypoglycemic drugs: Secondary | ICD-10-CM | POA: Diagnosis not present

## 2020-12-02 DIAGNOSIS — R531 Weakness: Secondary | ICD-10-CM | POA: Diagnosis not present

## 2020-12-02 DIAGNOSIS — Z7984 Long term (current) use of oral hypoglycemic drugs: Secondary | ICD-10-CM | POA: Diagnosis not present

## 2020-12-02 DIAGNOSIS — Z9181 History of falling: Secondary | ICD-10-CM | POA: Diagnosis not present

## 2020-12-02 DIAGNOSIS — Z8616 Personal history of COVID-19: Secondary | ICD-10-CM | POA: Diagnosis not present

## 2020-12-02 DIAGNOSIS — Z9981 Dependence on supplemental oxygen: Secondary | ICD-10-CM | POA: Diagnosis not present

## 2020-12-02 DIAGNOSIS — J441 Chronic obstructive pulmonary disease with (acute) exacerbation: Secondary | ICD-10-CM | POA: Diagnosis not present

## 2020-12-03 DIAGNOSIS — Z23 Encounter for immunization: Secondary | ICD-10-CM | POA: Diagnosis not present

## 2020-12-06 DIAGNOSIS — Z7984 Long term (current) use of oral hypoglycemic drugs: Secondary | ICD-10-CM | POA: Diagnosis not present

## 2020-12-06 DIAGNOSIS — Z8616 Personal history of COVID-19: Secondary | ICD-10-CM | POA: Diagnosis not present

## 2020-12-06 DIAGNOSIS — R531 Weakness: Secondary | ICD-10-CM | POA: Diagnosis not present

## 2020-12-06 DIAGNOSIS — J441 Chronic obstructive pulmonary disease with (acute) exacerbation: Secondary | ICD-10-CM | POA: Diagnosis not present

## 2020-12-06 DIAGNOSIS — Z9181 History of falling: Secondary | ICD-10-CM | POA: Diagnosis not present

## 2020-12-06 DIAGNOSIS — Z9981 Dependence on supplemental oxygen: Secondary | ICD-10-CM | POA: Diagnosis not present

## 2020-12-07 DIAGNOSIS — Z9181 History of falling: Secondary | ICD-10-CM | POA: Diagnosis not present

## 2020-12-07 DIAGNOSIS — J441 Chronic obstructive pulmonary disease with (acute) exacerbation: Secondary | ICD-10-CM | POA: Diagnosis not present

## 2020-12-07 DIAGNOSIS — Z8616 Personal history of COVID-19: Secondary | ICD-10-CM | POA: Diagnosis not present

## 2020-12-07 DIAGNOSIS — R531 Weakness: Secondary | ICD-10-CM | POA: Diagnosis not present

## 2020-12-07 DIAGNOSIS — Z7984 Long term (current) use of oral hypoglycemic drugs: Secondary | ICD-10-CM | POA: Diagnosis not present

## 2020-12-07 DIAGNOSIS — Z9981 Dependence on supplemental oxygen: Secondary | ICD-10-CM | POA: Diagnosis not present

## 2020-12-09 DIAGNOSIS — Z7984 Long term (current) use of oral hypoglycemic drugs: Secondary | ICD-10-CM | POA: Diagnosis not present

## 2020-12-09 DIAGNOSIS — R531 Weakness: Secondary | ICD-10-CM | POA: Diagnosis not present

## 2020-12-09 DIAGNOSIS — Z9981 Dependence on supplemental oxygen: Secondary | ICD-10-CM | POA: Diagnosis not present

## 2020-12-09 DIAGNOSIS — Z9181 History of falling: Secondary | ICD-10-CM | POA: Diagnosis not present

## 2020-12-09 DIAGNOSIS — J441 Chronic obstructive pulmonary disease with (acute) exacerbation: Secondary | ICD-10-CM | POA: Diagnosis not present

## 2020-12-09 DIAGNOSIS — Z8616 Personal history of COVID-19: Secondary | ICD-10-CM | POA: Diagnosis not present

## 2020-12-14 DIAGNOSIS — Z8616 Personal history of COVID-19: Secondary | ICD-10-CM | POA: Diagnosis not present

## 2020-12-14 DIAGNOSIS — Z9981 Dependence on supplemental oxygen: Secondary | ICD-10-CM | POA: Diagnosis not present

## 2020-12-14 DIAGNOSIS — J441 Chronic obstructive pulmonary disease with (acute) exacerbation: Secondary | ICD-10-CM | POA: Diagnosis not present

## 2020-12-14 DIAGNOSIS — Z9181 History of falling: Secondary | ICD-10-CM | POA: Diagnosis not present

## 2020-12-14 DIAGNOSIS — R531 Weakness: Secondary | ICD-10-CM | POA: Diagnosis not present

## 2020-12-14 DIAGNOSIS — Z7984 Long term (current) use of oral hypoglycemic drugs: Secondary | ICD-10-CM | POA: Diagnosis not present

## 2020-12-15 DIAGNOSIS — H353211 Exudative age-related macular degeneration, right eye, with active choroidal neovascularization: Secondary | ICD-10-CM | POA: Diagnosis not present

## 2020-12-16 DIAGNOSIS — R531 Weakness: Secondary | ICD-10-CM | POA: Diagnosis not present

## 2020-12-16 DIAGNOSIS — J441 Chronic obstructive pulmonary disease with (acute) exacerbation: Secondary | ICD-10-CM | POA: Diagnosis not present

## 2020-12-16 DIAGNOSIS — Z9981 Dependence on supplemental oxygen: Secondary | ICD-10-CM | POA: Diagnosis not present

## 2020-12-16 DIAGNOSIS — Z7984 Long term (current) use of oral hypoglycemic drugs: Secondary | ICD-10-CM | POA: Diagnosis not present

## 2020-12-16 DIAGNOSIS — Z8616 Personal history of COVID-19: Secondary | ICD-10-CM | POA: Diagnosis not present

## 2020-12-16 DIAGNOSIS — Z9181 History of falling: Secondary | ICD-10-CM | POA: Diagnosis not present

## 2020-12-22 DIAGNOSIS — E782 Mixed hyperlipidemia: Secondary | ICD-10-CM | POA: Diagnosis not present

## 2020-12-22 DIAGNOSIS — E039 Hypothyroidism, unspecified: Secondary | ICD-10-CM | POA: Diagnosis not present

## 2020-12-22 DIAGNOSIS — R531 Weakness: Secondary | ICD-10-CM | POA: Diagnosis not present

## 2020-12-22 DIAGNOSIS — I10 Essential (primary) hypertension: Secondary | ICD-10-CM | POA: Diagnosis not present

## 2020-12-22 DIAGNOSIS — D649 Anemia, unspecified: Secondary | ICD-10-CM | POA: Diagnosis not present

## 2020-12-22 DIAGNOSIS — K2100 Gastro-esophageal reflux disease with esophagitis, without bleeding: Secondary | ICD-10-CM | POA: Diagnosis not present

## 2020-12-22 DIAGNOSIS — Z7984 Long term (current) use of oral hypoglycemic drugs: Secondary | ICD-10-CM | POA: Diagnosis not present

## 2020-12-22 DIAGNOSIS — N189 Chronic kidney disease, unspecified: Secondary | ICD-10-CM | POA: Diagnosis not present

## 2020-12-22 DIAGNOSIS — E7849 Other hyperlipidemia: Secondary | ICD-10-CM | POA: Diagnosis not present

## 2020-12-22 DIAGNOSIS — Z8616 Personal history of COVID-19: Secondary | ICD-10-CM | POA: Diagnosis not present

## 2020-12-22 DIAGNOSIS — Z9181 History of falling: Secondary | ICD-10-CM | POA: Diagnosis not present

## 2020-12-22 DIAGNOSIS — D529 Folate deficiency anemia, unspecified: Secondary | ICD-10-CM | POA: Diagnosis not present

## 2020-12-22 DIAGNOSIS — J441 Chronic obstructive pulmonary disease with (acute) exacerbation: Secondary | ICD-10-CM | POA: Diagnosis not present

## 2020-12-22 DIAGNOSIS — E875 Hyperkalemia: Secondary | ICD-10-CM | POA: Diagnosis not present

## 2020-12-22 DIAGNOSIS — Z9981 Dependence on supplemental oxygen: Secondary | ICD-10-CM | POA: Diagnosis not present

## 2020-12-22 DIAGNOSIS — D519 Vitamin B12 deficiency anemia, unspecified: Secondary | ICD-10-CM | POA: Diagnosis not present

## 2020-12-22 DIAGNOSIS — E1122 Type 2 diabetes mellitus with diabetic chronic kidney disease: Secondary | ICD-10-CM | POA: Diagnosis not present

## 2020-12-23 DIAGNOSIS — S93401A Sprain of unspecified ligament of right ankle, initial encounter: Secondary | ICD-10-CM | POA: Diagnosis not present

## 2020-12-30 DIAGNOSIS — Z9181 History of falling: Secondary | ICD-10-CM | POA: Diagnosis not present

## 2020-12-30 DIAGNOSIS — J441 Chronic obstructive pulmonary disease with (acute) exacerbation: Secondary | ICD-10-CM | POA: Diagnosis not present

## 2020-12-30 DIAGNOSIS — Z8616 Personal history of COVID-19: Secondary | ICD-10-CM | POA: Diagnosis not present

## 2020-12-30 DIAGNOSIS — Z7984 Long term (current) use of oral hypoglycemic drugs: Secondary | ICD-10-CM | POA: Diagnosis not present

## 2020-12-30 DIAGNOSIS — Z9981 Dependence on supplemental oxygen: Secondary | ICD-10-CM | POA: Diagnosis not present

## 2020-12-30 DIAGNOSIS — R531 Weakness: Secondary | ICD-10-CM | POA: Diagnosis not present

## 2021-01-04 DIAGNOSIS — Z7984 Long term (current) use of oral hypoglycemic drugs: Secondary | ICD-10-CM | POA: Diagnosis not present

## 2021-01-04 DIAGNOSIS — Z8616 Personal history of COVID-19: Secondary | ICD-10-CM | POA: Diagnosis not present

## 2021-01-04 DIAGNOSIS — J441 Chronic obstructive pulmonary disease with (acute) exacerbation: Secondary | ICD-10-CM | POA: Diagnosis not present

## 2021-01-04 DIAGNOSIS — Z9981 Dependence on supplemental oxygen: Secondary | ICD-10-CM | POA: Diagnosis not present

## 2021-01-04 DIAGNOSIS — R531 Weakness: Secondary | ICD-10-CM | POA: Diagnosis not present

## 2021-01-04 DIAGNOSIS — Z9181 History of falling: Secondary | ICD-10-CM | POA: Diagnosis not present

## 2021-01-05 DIAGNOSIS — Z7984 Long term (current) use of oral hypoglycemic drugs: Secondary | ICD-10-CM | POA: Diagnosis not present

## 2021-01-05 DIAGNOSIS — J449 Chronic obstructive pulmonary disease, unspecified: Secondary | ICD-10-CM | POA: Diagnosis not present

## 2021-01-05 DIAGNOSIS — Z8616 Personal history of COVID-19: Secondary | ICD-10-CM | POA: Diagnosis not present

## 2021-01-05 DIAGNOSIS — R531 Weakness: Secondary | ICD-10-CM | POA: Diagnosis not present

## 2021-01-05 DIAGNOSIS — Z9181 History of falling: Secondary | ICD-10-CM | POA: Diagnosis not present

## 2021-01-05 DIAGNOSIS — Z7982 Long term (current) use of aspirin: Secondary | ICD-10-CM | POA: Diagnosis not present

## 2021-01-11 DIAGNOSIS — Z9181 History of falling: Secondary | ICD-10-CM | POA: Diagnosis not present

## 2021-01-11 DIAGNOSIS — Z7982 Long term (current) use of aspirin: Secondary | ICD-10-CM | POA: Diagnosis not present

## 2021-01-11 DIAGNOSIS — Z8616 Personal history of COVID-19: Secondary | ICD-10-CM | POA: Diagnosis not present

## 2021-01-11 DIAGNOSIS — J449 Chronic obstructive pulmonary disease, unspecified: Secondary | ICD-10-CM | POA: Diagnosis not present

## 2021-01-11 DIAGNOSIS — R531 Weakness: Secondary | ICD-10-CM | POA: Diagnosis not present

## 2021-01-11 DIAGNOSIS — Z7984 Long term (current) use of oral hypoglycemic drugs: Secondary | ICD-10-CM | POA: Diagnosis not present

## 2021-01-12 DIAGNOSIS — H353221 Exudative age-related macular degeneration, left eye, with active choroidal neovascularization: Secondary | ICD-10-CM | POA: Diagnosis not present

## 2021-01-13 DIAGNOSIS — Z7982 Long term (current) use of aspirin: Secondary | ICD-10-CM | POA: Diagnosis not present

## 2021-01-13 DIAGNOSIS — Z8616 Personal history of COVID-19: Secondary | ICD-10-CM | POA: Diagnosis not present

## 2021-01-13 DIAGNOSIS — Z9181 History of falling: Secondary | ICD-10-CM | POA: Diagnosis not present

## 2021-01-13 DIAGNOSIS — Z7984 Long term (current) use of oral hypoglycemic drugs: Secondary | ICD-10-CM | POA: Diagnosis not present

## 2021-01-13 DIAGNOSIS — R531 Weakness: Secondary | ICD-10-CM | POA: Diagnosis not present

## 2021-01-13 DIAGNOSIS — J449 Chronic obstructive pulmonary disease, unspecified: Secondary | ICD-10-CM | POA: Diagnosis not present

## 2021-01-17 DIAGNOSIS — M7631 Iliotibial band syndrome, right leg: Secondary | ICD-10-CM | POA: Diagnosis not present

## 2021-01-17 DIAGNOSIS — M25551 Pain in right hip: Secondary | ICD-10-CM | POA: Diagnosis not present

## 2021-01-18 DIAGNOSIS — Z7984 Long term (current) use of oral hypoglycemic drugs: Secondary | ICD-10-CM | POA: Diagnosis not present

## 2021-01-18 DIAGNOSIS — R531 Weakness: Secondary | ICD-10-CM | POA: Diagnosis not present

## 2021-01-18 DIAGNOSIS — Z8616 Personal history of COVID-19: Secondary | ICD-10-CM | POA: Diagnosis not present

## 2021-01-18 DIAGNOSIS — Z7982 Long term (current) use of aspirin: Secondary | ICD-10-CM | POA: Diagnosis not present

## 2021-01-18 DIAGNOSIS — Z9181 History of falling: Secondary | ICD-10-CM | POA: Diagnosis not present

## 2021-01-18 DIAGNOSIS — J449 Chronic obstructive pulmonary disease, unspecified: Secondary | ICD-10-CM | POA: Diagnosis not present

## 2021-01-20 DIAGNOSIS — Z9181 History of falling: Secondary | ICD-10-CM | POA: Diagnosis not present

## 2021-01-20 DIAGNOSIS — Z7982 Long term (current) use of aspirin: Secondary | ICD-10-CM | POA: Diagnosis not present

## 2021-01-20 DIAGNOSIS — R531 Weakness: Secondary | ICD-10-CM | POA: Diagnosis not present

## 2021-01-20 DIAGNOSIS — J449 Chronic obstructive pulmonary disease, unspecified: Secondary | ICD-10-CM | POA: Diagnosis not present

## 2021-01-20 DIAGNOSIS — Z7984 Long term (current) use of oral hypoglycemic drugs: Secondary | ICD-10-CM | POA: Diagnosis not present

## 2021-01-20 DIAGNOSIS — Z8616 Personal history of COVID-19: Secondary | ICD-10-CM | POA: Diagnosis not present

## 2021-01-25 DIAGNOSIS — R531 Weakness: Secondary | ICD-10-CM | POA: Diagnosis not present

## 2021-01-25 DIAGNOSIS — J449 Chronic obstructive pulmonary disease, unspecified: Secondary | ICD-10-CM | POA: Diagnosis not present

## 2021-01-25 DIAGNOSIS — Z7982 Long term (current) use of aspirin: Secondary | ICD-10-CM | POA: Diagnosis not present

## 2021-01-25 DIAGNOSIS — Z8616 Personal history of COVID-19: Secondary | ICD-10-CM | POA: Diagnosis not present

## 2021-01-25 DIAGNOSIS — Z7984 Long term (current) use of oral hypoglycemic drugs: Secondary | ICD-10-CM | POA: Diagnosis not present

## 2021-01-25 DIAGNOSIS — Z9181 History of falling: Secondary | ICD-10-CM | POA: Diagnosis not present

## 2021-01-27 DIAGNOSIS — Z7984 Long term (current) use of oral hypoglycemic drugs: Secondary | ICD-10-CM | POA: Diagnosis not present

## 2021-01-27 DIAGNOSIS — J449 Chronic obstructive pulmonary disease, unspecified: Secondary | ICD-10-CM | POA: Diagnosis not present

## 2021-01-27 DIAGNOSIS — Z9181 History of falling: Secondary | ICD-10-CM | POA: Diagnosis not present

## 2021-01-27 DIAGNOSIS — Z7982 Long term (current) use of aspirin: Secondary | ICD-10-CM | POA: Diagnosis not present

## 2021-01-27 DIAGNOSIS — Z8616 Personal history of COVID-19: Secondary | ICD-10-CM | POA: Diagnosis not present

## 2021-01-27 DIAGNOSIS — R531 Weakness: Secondary | ICD-10-CM | POA: Diagnosis not present

## 2021-02-01 DIAGNOSIS — Z7982 Long term (current) use of aspirin: Secondary | ICD-10-CM | POA: Diagnosis not present

## 2021-02-01 DIAGNOSIS — J449 Chronic obstructive pulmonary disease, unspecified: Secondary | ICD-10-CM | POA: Diagnosis not present

## 2021-02-01 DIAGNOSIS — Z7984 Long term (current) use of oral hypoglycemic drugs: Secondary | ICD-10-CM | POA: Diagnosis not present

## 2021-02-01 DIAGNOSIS — Z8616 Personal history of COVID-19: Secondary | ICD-10-CM | POA: Diagnosis not present

## 2021-02-01 DIAGNOSIS — Z9181 History of falling: Secondary | ICD-10-CM | POA: Diagnosis not present

## 2021-02-01 DIAGNOSIS — R531 Weakness: Secondary | ICD-10-CM | POA: Diagnosis not present

## 2021-02-04 DIAGNOSIS — Z8616 Personal history of COVID-19: Secondary | ICD-10-CM | POA: Diagnosis not present

## 2021-02-04 DIAGNOSIS — Z7984 Long term (current) use of oral hypoglycemic drugs: Secondary | ICD-10-CM | POA: Diagnosis not present

## 2021-02-04 DIAGNOSIS — R531 Weakness: Secondary | ICD-10-CM | POA: Diagnosis not present

## 2021-02-04 DIAGNOSIS — Z7982 Long term (current) use of aspirin: Secondary | ICD-10-CM | POA: Diagnosis not present

## 2021-02-04 DIAGNOSIS — J449 Chronic obstructive pulmonary disease, unspecified: Secondary | ICD-10-CM | POA: Diagnosis not present

## 2021-02-04 DIAGNOSIS — Z9181 History of falling: Secondary | ICD-10-CM | POA: Diagnosis not present

## 2021-02-08 DIAGNOSIS — R4582 Worries: Secondary | ICD-10-CM | POA: Diagnosis not present

## 2021-02-08 DIAGNOSIS — K2100 Gastro-esophageal reflux disease with esophagitis, without bleeding: Secondary | ICD-10-CM | POA: Diagnosis not present

## 2021-02-08 DIAGNOSIS — Z7982 Long term (current) use of aspirin: Secondary | ICD-10-CM | POA: Diagnosis not present

## 2021-02-08 DIAGNOSIS — Z1212 Encounter for screening for malignant neoplasm of rectum: Secondary | ICD-10-CM | POA: Diagnosis not present

## 2021-02-08 DIAGNOSIS — J449 Chronic obstructive pulmonary disease, unspecified: Secondary | ICD-10-CM | POA: Diagnosis not present

## 2021-02-08 DIAGNOSIS — I10 Essential (primary) hypertension: Secondary | ICD-10-CM | POA: Diagnosis not present

## 2021-02-08 DIAGNOSIS — I714 Abdominal aortic aneurysm, without rupture, unspecified: Secondary | ICD-10-CM | POA: Diagnosis not present

## 2021-02-08 DIAGNOSIS — J9611 Chronic respiratory failure with hypoxia: Secondary | ICD-10-CM | POA: Diagnosis not present

## 2021-02-08 DIAGNOSIS — Z7984 Long term (current) use of oral hypoglycemic drugs: Secondary | ICD-10-CM | POA: Diagnosis not present

## 2021-02-08 DIAGNOSIS — D500 Iron deficiency anemia secondary to blood loss (chronic): Secondary | ICD-10-CM | POA: Diagnosis not present

## 2021-02-08 DIAGNOSIS — E1122 Type 2 diabetes mellitus with diabetic chronic kidney disease: Secondary | ICD-10-CM | POA: Diagnosis not present

## 2021-02-08 DIAGNOSIS — N184 Chronic kidney disease, stage 4 (severe): Secondary | ICD-10-CM | POA: Diagnosis not present

## 2021-02-08 DIAGNOSIS — F331 Major depressive disorder, recurrent, moderate: Secondary | ICD-10-CM | POA: Diagnosis not present

## 2021-02-08 DIAGNOSIS — E782 Mixed hyperlipidemia: Secondary | ICD-10-CM | POA: Diagnosis not present

## 2021-02-08 DIAGNOSIS — E039 Hypothyroidism, unspecified: Secondary | ICD-10-CM | POA: Diagnosis not present

## 2021-02-08 DIAGNOSIS — E11622 Type 2 diabetes mellitus with other skin ulcer: Secondary | ICD-10-CM | POA: Diagnosis not present

## 2021-02-08 DIAGNOSIS — R531 Weakness: Secondary | ICD-10-CM | POA: Diagnosis not present

## 2021-02-08 DIAGNOSIS — Z9181 History of falling: Secondary | ICD-10-CM | POA: Diagnosis not present

## 2021-02-08 DIAGNOSIS — Z8616 Personal history of COVID-19: Secondary | ICD-10-CM | POA: Diagnosis not present

## 2021-02-09 DIAGNOSIS — D649 Anemia, unspecified: Secondary | ICD-10-CM | POA: Diagnosis not present

## 2021-02-10 DIAGNOSIS — D649 Anemia, unspecified: Secondary | ICD-10-CM | POA: Diagnosis not present

## 2021-02-11 DIAGNOSIS — Z9181 History of falling: Secondary | ICD-10-CM | POA: Diagnosis not present

## 2021-02-11 DIAGNOSIS — R531 Weakness: Secondary | ICD-10-CM | POA: Diagnosis not present

## 2021-02-11 DIAGNOSIS — Z7984 Long term (current) use of oral hypoglycemic drugs: Secondary | ICD-10-CM | POA: Diagnosis not present

## 2021-02-11 DIAGNOSIS — Z8616 Personal history of COVID-19: Secondary | ICD-10-CM | POA: Diagnosis not present

## 2021-02-11 DIAGNOSIS — J449 Chronic obstructive pulmonary disease, unspecified: Secondary | ICD-10-CM | POA: Diagnosis not present

## 2021-02-11 DIAGNOSIS — Z7982 Long term (current) use of aspirin: Secondary | ICD-10-CM | POA: Diagnosis not present

## 2021-02-14 DIAGNOSIS — H524 Presbyopia: Secondary | ICD-10-CM | POA: Diagnosis not present

## 2021-02-14 DIAGNOSIS — H538 Other visual disturbances: Secondary | ICD-10-CM | POA: Diagnosis not present

## 2021-02-14 DIAGNOSIS — E119 Type 2 diabetes mellitus without complications: Secondary | ICD-10-CM | POA: Diagnosis not present

## 2021-02-14 DIAGNOSIS — H353231 Exudative age-related macular degeneration, bilateral, with active choroidal neovascularization: Secondary | ICD-10-CM | POA: Diagnosis not present

## 2021-02-14 DIAGNOSIS — H18593 Other hereditary corneal dystrophies, bilateral: Secondary | ICD-10-CM | POA: Diagnosis not present

## 2021-02-14 DIAGNOSIS — H04123 Dry eye syndrome of bilateral lacrimal glands: Secondary | ICD-10-CM | POA: Diagnosis not present

## 2021-02-15 DIAGNOSIS — Z7984 Long term (current) use of oral hypoglycemic drugs: Secondary | ICD-10-CM | POA: Diagnosis not present

## 2021-02-15 DIAGNOSIS — Z8616 Personal history of COVID-19: Secondary | ICD-10-CM | POA: Diagnosis not present

## 2021-02-15 DIAGNOSIS — Z7982 Long term (current) use of aspirin: Secondary | ICD-10-CM | POA: Diagnosis not present

## 2021-02-15 DIAGNOSIS — R531 Weakness: Secondary | ICD-10-CM | POA: Diagnosis not present

## 2021-02-15 DIAGNOSIS — Z9181 History of falling: Secondary | ICD-10-CM | POA: Diagnosis not present

## 2021-02-15 DIAGNOSIS — J449 Chronic obstructive pulmonary disease, unspecified: Secondary | ICD-10-CM | POA: Diagnosis not present

## 2021-02-16 DIAGNOSIS — H353211 Exudative age-related macular degeneration, right eye, with active choroidal neovascularization: Secondary | ICD-10-CM | POA: Diagnosis not present

## 2021-02-17 DIAGNOSIS — J449 Chronic obstructive pulmonary disease, unspecified: Secondary | ICD-10-CM | POA: Diagnosis not present

## 2021-02-17 DIAGNOSIS — Z7982 Long term (current) use of aspirin: Secondary | ICD-10-CM | POA: Diagnosis not present

## 2021-02-17 DIAGNOSIS — Z7984 Long term (current) use of oral hypoglycemic drugs: Secondary | ICD-10-CM | POA: Diagnosis not present

## 2021-02-17 DIAGNOSIS — Z8616 Personal history of COVID-19: Secondary | ICD-10-CM | POA: Diagnosis not present

## 2021-02-17 DIAGNOSIS — R531 Weakness: Secondary | ICD-10-CM | POA: Diagnosis not present

## 2021-02-17 DIAGNOSIS — Z9181 History of falling: Secondary | ICD-10-CM | POA: Diagnosis not present

## 2021-02-22 DIAGNOSIS — Z9181 History of falling: Secondary | ICD-10-CM | POA: Diagnosis not present

## 2021-02-22 DIAGNOSIS — J449 Chronic obstructive pulmonary disease, unspecified: Secondary | ICD-10-CM | POA: Diagnosis not present

## 2021-02-22 DIAGNOSIS — Z8616 Personal history of COVID-19: Secondary | ICD-10-CM | POA: Diagnosis not present

## 2021-02-22 DIAGNOSIS — Z7982 Long term (current) use of aspirin: Secondary | ICD-10-CM | POA: Diagnosis not present

## 2021-02-22 DIAGNOSIS — R531 Weakness: Secondary | ICD-10-CM | POA: Diagnosis not present

## 2021-02-22 DIAGNOSIS — Z7984 Long term (current) use of oral hypoglycemic drugs: Secondary | ICD-10-CM | POA: Diagnosis not present

## 2021-02-24 DIAGNOSIS — Z7984 Long term (current) use of oral hypoglycemic drugs: Secondary | ICD-10-CM | POA: Diagnosis not present

## 2021-02-24 DIAGNOSIS — Z7982 Long term (current) use of aspirin: Secondary | ICD-10-CM | POA: Diagnosis not present

## 2021-02-24 DIAGNOSIS — Z9181 History of falling: Secondary | ICD-10-CM | POA: Diagnosis not present

## 2021-02-24 DIAGNOSIS — Z8616 Personal history of COVID-19: Secondary | ICD-10-CM | POA: Diagnosis not present

## 2021-02-24 DIAGNOSIS — R531 Weakness: Secondary | ICD-10-CM | POA: Diagnosis not present

## 2021-02-24 DIAGNOSIS — J449 Chronic obstructive pulmonary disease, unspecified: Secondary | ICD-10-CM | POA: Diagnosis not present

## 2021-03-01 DIAGNOSIS — J449 Chronic obstructive pulmonary disease, unspecified: Secondary | ICD-10-CM | POA: Diagnosis not present

## 2021-03-01 DIAGNOSIS — Z8616 Personal history of COVID-19: Secondary | ICD-10-CM | POA: Diagnosis not present

## 2021-03-01 DIAGNOSIS — R531 Weakness: Secondary | ICD-10-CM | POA: Diagnosis not present

## 2021-03-01 DIAGNOSIS — Z7984 Long term (current) use of oral hypoglycemic drugs: Secondary | ICD-10-CM | POA: Diagnosis not present

## 2021-03-01 DIAGNOSIS — Z9181 History of falling: Secondary | ICD-10-CM | POA: Diagnosis not present

## 2021-03-01 DIAGNOSIS — Z7982 Long term (current) use of aspirin: Secondary | ICD-10-CM | POA: Diagnosis not present

## 2021-03-03 DIAGNOSIS — R531 Weakness: Secondary | ICD-10-CM | POA: Diagnosis not present

## 2021-03-03 DIAGNOSIS — Z9181 History of falling: Secondary | ICD-10-CM | POA: Diagnosis not present

## 2021-03-03 DIAGNOSIS — Z8616 Personal history of COVID-19: Secondary | ICD-10-CM | POA: Diagnosis not present

## 2021-03-03 DIAGNOSIS — J449 Chronic obstructive pulmonary disease, unspecified: Secondary | ICD-10-CM | POA: Diagnosis not present

## 2021-03-03 DIAGNOSIS — Z7982 Long term (current) use of aspirin: Secondary | ICD-10-CM | POA: Diagnosis not present

## 2021-03-03 DIAGNOSIS — Z7984 Long term (current) use of oral hypoglycemic drugs: Secondary | ICD-10-CM | POA: Diagnosis not present

## 2021-03-04 DIAGNOSIS — Z7982 Long term (current) use of aspirin: Secondary | ICD-10-CM | POA: Diagnosis not present

## 2021-03-04 DIAGNOSIS — R531 Weakness: Secondary | ICD-10-CM | POA: Diagnosis not present

## 2021-03-04 DIAGNOSIS — J449 Chronic obstructive pulmonary disease, unspecified: Secondary | ICD-10-CM | POA: Diagnosis not present

## 2021-03-04 DIAGNOSIS — J9611 Chronic respiratory failure with hypoxia: Secondary | ICD-10-CM | POA: Diagnosis not present

## 2021-03-04 DIAGNOSIS — Z8616 Personal history of COVID-19: Secondary | ICD-10-CM | POA: Diagnosis not present

## 2021-03-04 DIAGNOSIS — Z7984 Long term (current) use of oral hypoglycemic drugs: Secondary | ICD-10-CM | POA: Diagnosis not present

## 2021-03-04 DIAGNOSIS — J441 Chronic obstructive pulmonary disease with (acute) exacerbation: Secondary | ICD-10-CM | POA: Diagnosis not present

## 2021-03-04 DIAGNOSIS — Z9181 History of falling: Secondary | ICD-10-CM | POA: Diagnosis not present

## 2021-03-09 DIAGNOSIS — H353221 Exudative age-related macular degeneration, left eye, with active choroidal neovascularization: Secondary | ICD-10-CM | POA: Diagnosis not present

## 2021-03-10 DIAGNOSIS — M7631 Iliotibial band syndrome, right leg: Secondary | ICD-10-CM | POA: Diagnosis not present

## 2021-03-11 DIAGNOSIS — D649 Anemia, unspecified: Secondary | ICD-10-CM | POA: Diagnosis not present

## 2021-03-11 DIAGNOSIS — Z91012 Allergy to eggs: Secondary | ICD-10-CM | POA: Diagnosis not present

## 2021-03-11 DIAGNOSIS — R0600 Dyspnea, unspecified: Secondary | ICD-10-CM | POA: Diagnosis not present

## 2021-03-11 DIAGNOSIS — R079 Chest pain, unspecified: Secondary | ICD-10-CM | POA: Diagnosis not present

## 2021-03-11 DIAGNOSIS — Z882 Allergy status to sulfonamides status: Secondary | ICD-10-CM | POA: Diagnosis not present

## 2021-03-11 DIAGNOSIS — R059 Cough, unspecified: Secondary | ICD-10-CM | POA: Diagnosis not present

## 2021-03-11 DIAGNOSIS — K8020 Calculus of gallbladder without cholecystitis without obstruction: Secondary | ICD-10-CM | POA: Diagnosis not present

## 2021-03-11 DIAGNOSIS — Z91010 Allergy to peanuts: Secondary | ICD-10-CM | POA: Diagnosis not present

## 2021-03-11 DIAGNOSIS — I517 Cardiomegaly: Secondary | ICD-10-CM | POA: Diagnosis not present

## 2021-03-11 DIAGNOSIS — I700 Atherosclerosis of aorta: Secondary | ICD-10-CM | POA: Diagnosis not present

## 2021-03-11 DIAGNOSIS — R062 Wheezing: Secondary | ICD-10-CM | POA: Diagnosis not present

## 2021-03-11 DIAGNOSIS — Z880 Allergy status to penicillin: Secondary | ICD-10-CM | POA: Diagnosis not present

## 2021-03-11 DIAGNOSIS — I10 Essential (primary) hypertension: Secondary | ICD-10-CM | POA: Diagnosis not present

## 2021-03-11 DIAGNOSIS — R000 Tachycardia, unspecified: Secondary | ICD-10-CM | POA: Diagnosis not present

## 2021-03-11 DIAGNOSIS — N289 Disorder of kidney and ureter, unspecified: Secondary | ICD-10-CM | POA: Diagnosis not present

## 2021-03-11 DIAGNOSIS — Z20822 Contact with and (suspected) exposure to covid-19: Secondary | ICD-10-CM | POA: Diagnosis not present

## 2021-03-11 DIAGNOSIS — J441 Chronic obstructive pulmonary disease with (acute) exacerbation: Secondary | ICD-10-CM | POA: Diagnosis not present

## 2021-03-11 DIAGNOSIS — Z87891 Personal history of nicotine dependence: Secondary | ICD-10-CM | POA: Diagnosis not present

## 2021-03-11 DIAGNOSIS — R109 Unspecified abdominal pain: Secondary | ICD-10-CM | POA: Diagnosis not present

## 2021-03-11 DIAGNOSIS — R531 Weakness: Secondary | ICD-10-CM | POA: Diagnosis not present

## 2021-03-11 DIAGNOSIS — Z5329 Procedure and treatment not carried out because of patient's decision for other reasons: Secondary | ICD-10-CM | POA: Diagnosis not present

## 2021-03-11 DIAGNOSIS — Z91011 Allergy to milk products: Secondary | ICD-10-CM | POA: Diagnosis not present

## 2021-03-11 DIAGNOSIS — R519 Headache, unspecified: Secondary | ICD-10-CM | POA: Diagnosis not present

## 2021-03-12 DIAGNOSIS — D649 Anemia, unspecified: Secondary | ICD-10-CM | POA: Diagnosis not present

## 2021-03-12 DIAGNOSIS — R0602 Shortness of breath: Secondary | ICD-10-CM | POA: Diagnosis not present

## 2021-03-13 DIAGNOSIS — R2689 Other abnormalities of gait and mobility: Secondary | ICD-10-CM | POA: Diagnosis not present

## 2021-03-13 DIAGNOSIS — E1122 Type 2 diabetes mellitus with diabetic chronic kidney disease: Secondary | ICD-10-CM | POA: Diagnosis not present

## 2021-03-13 DIAGNOSIS — Z743 Need for continuous supervision: Secondary | ICD-10-CM | POA: Diagnosis not present

## 2021-03-13 DIAGNOSIS — K3189 Other diseases of stomach and duodenum: Secondary | ICD-10-CM | POA: Diagnosis not present

## 2021-03-13 DIAGNOSIS — E538 Deficiency of other specified B group vitamins: Secondary | ICD-10-CM | POA: Diagnosis not present

## 2021-03-13 DIAGNOSIS — K922 Gastrointestinal hemorrhage, unspecified: Secondary | ICD-10-CM | POA: Diagnosis not present

## 2021-03-13 DIAGNOSIS — J9621 Acute and chronic respiratory failure with hypoxia: Secondary | ICD-10-CM | POA: Diagnosis not present

## 2021-03-13 DIAGNOSIS — N179 Acute kidney failure, unspecified: Secondary | ICD-10-CM | POA: Diagnosis not present

## 2021-03-13 DIAGNOSIS — Z9981 Dependence on supplemental oxygen: Secondary | ICD-10-CM | POA: Diagnosis not present

## 2021-03-13 DIAGNOSIS — D649 Anemia, unspecified: Secondary | ICD-10-CM | POA: Diagnosis not present

## 2021-03-13 DIAGNOSIS — Z9889 Other specified postprocedural states: Secondary | ICD-10-CM | POA: Diagnosis not present

## 2021-03-13 DIAGNOSIS — D509 Iron deficiency anemia, unspecified: Secondary | ICD-10-CM | POA: Diagnosis not present

## 2021-03-13 DIAGNOSIS — F419 Anxiety disorder, unspecified: Secondary | ICD-10-CM | POA: Diagnosis not present

## 2021-03-13 DIAGNOSIS — K219 Gastro-esophageal reflux disease without esophagitis: Secondary | ICD-10-CM | POA: Diagnosis not present

## 2021-03-13 DIAGNOSIS — D500 Iron deficiency anemia secondary to blood loss (chronic): Secondary | ICD-10-CM | POA: Diagnosis not present

## 2021-03-13 DIAGNOSIS — E669 Obesity, unspecified: Secondary | ICD-10-CM | POA: Diagnosis not present

## 2021-03-13 DIAGNOSIS — I10 Essential (primary) hypertension: Secondary | ICD-10-CM | POA: Diagnosis not present

## 2021-03-13 DIAGNOSIS — I5033 Acute on chronic diastolic (congestive) heart failure: Secondary | ICD-10-CM | POA: Diagnosis not present

## 2021-03-13 DIAGNOSIS — E1142 Type 2 diabetes mellitus with diabetic polyneuropathy: Secondary | ICD-10-CM | POA: Diagnosis not present

## 2021-03-13 DIAGNOSIS — N184 Chronic kidney disease, stage 4 (severe): Secondary | ICD-10-CM | POA: Diagnosis not present

## 2021-03-13 DIAGNOSIS — D508 Other iron deficiency anemias: Secondary | ICD-10-CM | POA: Diagnosis not present

## 2021-03-13 DIAGNOSIS — J431 Panlobular emphysema: Secondary | ICD-10-CM | POA: Diagnosis not present

## 2021-03-13 DIAGNOSIS — Z96651 Presence of right artificial knee joint: Secondary | ICD-10-CM | POA: Diagnosis not present

## 2021-03-13 DIAGNOSIS — K2970 Gastritis, unspecified, without bleeding: Secondary | ICD-10-CM | POA: Diagnosis not present

## 2021-03-13 DIAGNOSIS — R1314 Dysphagia, pharyngoesophageal phase: Secondary | ICD-10-CM | POA: Diagnosis not present

## 2021-03-13 DIAGNOSIS — R918 Other nonspecific abnormal finding of lung field: Secondary | ICD-10-CM | POA: Diagnosis not present

## 2021-03-13 DIAGNOSIS — E039 Hypothyroidism, unspecified: Secondary | ICD-10-CM | POA: Diagnosis not present

## 2021-03-13 DIAGNOSIS — D696 Thrombocytopenia, unspecified: Secondary | ICD-10-CM | POA: Diagnosis not present

## 2021-03-13 DIAGNOSIS — T17320D Food in larynx causing asphyxiation, subsequent encounter: Secondary | ICD-10-CM | POA: Diagnosis not present

## 2021-03-13 DIAGNOSIS — G253 Myoclonus: Secondary | ICD-10-CM | POA: Diagnosis not present

## 2021-03-13 DIAGNOSIS — K319 Disease of stomach and duodenum, unspecified: Secondary | ICD-10-CM | POA: Diagnosis not present

## 2021-03-13 DIAGNOSIS — Z6834 Body mass index (BMI) 34.0-34.9, adult: Secondary | ICD-10-CM | POA: Diagnosis not present

## 2021-03-13 DIAGNOSIS — I081 Rheumatic disorders of both mitral and tricuspid valves: Secondary | ICD-10-CM | POA: Diagnosis not present

## 2021-03-13 DIAGNOSIS — R0602 Shortness of breath: Secondary | ICD-10-CM | POA: Diagnosis not present

## 2021-03-13 DIAGNOSIS — J449 Chronic obstructive pulmonary disease, unspecified: Secondary | ICD-10-CM | POA: Diagnosis not present

## 2021-03-13 DIAGNOSIS — J690 Pneumonitis due to inhalation of food and vomit: Secondary | ICD-10-CM | POA: Diagnosis not present

## 2021-03-13 DIAGNOSIS — I517 Cardiomegaly: Secondary | ICD-10-CM | POA: Diagnosis not present

## 2021-03-13 DIAGNOSIS — R29898 Other symptoms and signs involving the musculoskeletal system: Secondary | ICD-10-CM | POA: Diagnosis not present

## 2021-03-13 DIAGNOSIS — I130 Hypertensive heart and chronic kidney disease with heart failure and stage 1 through stage 4 chronic kidney disease, or unspecified chronic kidney disease: Secondary | ICD-10-CM | POA: Diagnosis not present

## 2021-03-13 DIAGNOSIS — I5032 Chronic diastolic (congestive) heart failure: Secondary | ICD-10-CM | POA: Diagnosis not present

## 2021-03-13 DIAGNOSIS — Z20822 Contact with and (suspected) exposure to covid-19: Secondary | ICD-10-CM | POA: Diagnosis not present

## 2021-03-13 DIAGNOSIS — R1310 Dysphagia, unspecified: Secondary | ICD-10-CM | POA: Diagnosis not present

## 2021-03-13 DIAGNOSIS — Z8679 Personal history of other diseases of the circulatory system: Secondary | ICD-10-CM | POA: Diagnosis not present

## 2021-03-13 DIAGNOSIS — M6281 Muscle weakness (generalized): Secondary | ICD-10-CM | POA: Diagnosis not present

## 2021-03-13 DIAGNOSIS — R41841 Cognitive communication deficit: Secondary | ICD-10-CM | POA: Diagnosis not present

## 2021-03-13 DIAGNOSIS — R0989 Other specified symptoms and signs involving the circulatory and respiratory systems: Secondary | ICD-10-CM | POA: Diagnosis not present

## 2021-03-13 DIAGNOSIS — R059 Cough, unspecified: Secondary | ICD-10-CM | POA: Diagnosis not present

## 2021-03-13 DIAGNOSIS — K222 Esophageal obstruction: Secondary | ICD-10-CM | POA: Diagnosis not present

## 2021-03-13 DIAGNOSIS — J9811 Atelectasis: Secondary | ICD-10-CM | POA: Diagnosis not present

## 2021-03-13 DIAGNOSIS — E875 Hyperkalemia: Secondary | ICD-10-CM | POA: Diagnosis not present

## 2021-03-21 DIAGNOSIS — M6281 Muscle weakness (generalized): Secondary | ICD-10-CM | POA: Diagnosis not present

## 2021-03-21 DIAGNOSIS — Z9981 Dependence on supplemental oxygen: Secondary | ICD-10-CM | POA: Diagnosis not present

## 2021-03-21 DIAGNOSIS — R29898 Other symptoms and signs involving the musculoskeletal system: Secondary | ICD-10-CM | POA: Diagnosis not present

## 2021-03-21 DIAGNOSIS — Z743 Need for continuous supervision: Secondary | ICD-10-CM | POA: Diagnosis not present

## 2021-03-21 DIAGNOSIS — R41841 Cognitive communication deficit: Secondary | ICD-10-CM | POA: Diagnosis not present

## 2021-03-21 DIAGNOSIS — I10 Essential (primary) hypertension: Secondary | ICD-10-CM | POA: Diagnosis not present

## 2021-03-21 DIAGNOSIS — J431 Panlobular emphysema: Secondary | ICD-10-CM | POA: Diagnosis not present

## 2021-03-21 DIAGNOSIS — I5032 Chronic diastolic (congestive) heart failure: Secondary | ICD-10-CM | POA: Diagnosis not present

## 2021-03-21 DIAGNOSIS — R1314 Dysphagia, pharyngoesophageal phase: Secondary | ICD-10-CM | POA: Diagnosis not present

## 2021-03-21 DIAGNOSIS — D649 Anemia, unspecified: Secondary | ICD-10-CM | POA: Diagnosis not present

## 2021-03-21 DIAGNOSIS — R2689 Other abnormalities of gait and mobility: Secondary | ICD-10-CM | POA: Diagnosis not present

## 2021-03-21 DIAGNOSIS — D508 Other iron deficiency anemias: Secondary | ICD-10-CM | POA: Diagnosis not present

## 2021-03-21 DIAGNOSIS — J9621 Acute and chronic respiratory failure with hypoxia: Secondary | ICD-10-CM | POA: Diagnosis not present

## 2021-03-21 DIAGNOSIS — J449 Chronic obstructive pulmonary disease, unspecified: Secondary | ICD-10-CM | POA: Diagnosis not present

## 2021-03-21 DIAGNOSIS — N184 Chronic kidney disease, stage 4 (severe): Secondary | ICD-10-CM | POA: Diagnosis not present

## 2021-03-21 DIAGNOSIS — E1122 Type 2 diabetes mellitus with diabetic chronic kidney disease: Secondary | ICD-10-CM | POA: Diagnosis not present

## 2021-03-21 DIAGNOSIS — D509 Iron deficiency anemia, unspecified: Secondary | ICD-10-CM | POA: Diagnosis not present

## 2021-03-25 DIAGNOSIS — D649 Anemia, unspecified: Secondary | ICD-10-CM | POA: Diagnosis not present

## 2021-03-27 DIAGNOSIS — D508 Other iron deficiency anemias: Secondary | ICD-10-CM | POA: Diagnosis not present

## 2021-03-27 DIAGNOSIS — E1122 Type 2 diabetes mellitus with diabetic chronic kidney disease: Secondary | ICD-10-CM | POA: Diagnosis not present

## 2021-03-27 DIAGNOSIS — J431 Panlobular emphysema: Secondary | ICD-10-CM | POA: Diagnosis not present

## 2021-04-01 DIAGNOSIS — Z87891 Personal history of nicotine dependence: Secondary | ICD-10-CM | POA: Diagnosis not present

## 2021-04-01 DIAGNOSIS — J449 Chronic obstructive pulmonary disease, unspecified: Secondary | ICD-10-CM | POA: Diagnosis not present

## 2021-04-01 DIAGNOSIS — Z7984 Long term (current) use of oral hypoglycemic drugs: Secondary | ICD-10-CM | POA: Diagnosis not present

## 2021-04-01 DIAGNOSIS — I712 Thoracic aortic aneurysm, without rupture, unspecified: Secondary | ICD-10-CM | POA: Diagnosis not present

## 2021-04-01 DIAGNOSIS — E1122 Type 2 diabetes mellitus with diabetic chronic kidney disease: Secondary | ICD-10-CM | POA: Diagnosis not present

## 2021-04-01 DIAGNOSIS — Z9981 Dependence on supplemental oxygen: Secondary | ICD-10-CM | POA: Diagnosis not present

## 2021-04-01 DIAGNOSIS — K219 Gastro-esophageal reflux disease without esophagitis: Secondary | ICD-10-CM | POA: Diagnosis not present

## 2021-04-01 DIAGNOSIS — E538 Deficiency of other specified B group vitamins: Secondary | ICD-10-CM | POA: Diagnosis not present

## 2021-04-01 DIAGNOSIS — D500 Iron deficiency anemia secondary to blood loss (chronic): Secondary | ICD-10-CM | POA: Diagnosis not present

## 2021-04-01 DIAGNOSIS — E1142 Type 2 diabetes mellitus with diabetic polyneuropathy: Secondary | ICD-10-CM | POA: Diagnosis not present

## 2021-04-01 DIAGNOSIS — Z8673 Personal history of transient ischemic attack (TIA), and cerebral infarction without residual deficits: Secondary | ICD-10-CM | POA: Diagnosis not present

## 2021-04-01 DIAGNOSIS — E785 Hyperlipidemia, unspecified: Secondary | ICD-10-CM | POA: Diagnosis not present

## 2021-04-01 DIAGNOSIS — E039 Hypothyroidism, unspecified: Secondary | ICD-10-CM | POA: Diagnosis not present

## 2021-04-01 DIAGNOSIS — I5032 Chronic diastolic (congestive) heart failure: Secondary | ICD-10-CM | POA: Diagnosis not present

## 2021-04-01 DIAGNOSIS — Z9181 History of falling: Secondary | ICD-10-CM | POA: Diagnosis not present

## 2021-04-01 DIAGNOSIS — M1990 Unspecified osteoarthritis, unspecified site: Secondary | ICD-10-CM | POA: Diagnosis not present

## 2021-04-01 DIAGNOSIS — J9621 Acute and chronic respiratory failure with hypoxia: Secondary | ICD-10-CM | POA: Diagnosis not present

## 2021-04-01 DIAGNOSIS — K5790 Diverticulosis of intestine, part unspecified, without perforation or abscess without bleeding: Secondary | ICD-10-CM | POA: Diagnosis not present

## 2021-04-01 DIAGNOSIS — R1310 Dysphagia, unspecified: Secondary | ICD-10-CM | POA: Diagnosis not present

## 2021-04-01 DIAGNOSIS — I130 Hypertensive heart and chronic kidney disease with heart failure and stage 1 through stage 4 chronic kidney disease, or unspecified chronic kidney disease: Secondary | ICD-10-CM | POA: Diagnosis not present

## 2021-04-01 DIAGNOSIS — Z7951 Long term (current) use of inhaled steroids: Secondary | ICD-10-CM | POA: Diagnosis not present

## 2021-04-01 DIAGNOSIS — F419 Anxiety disorder, unspecified: Secondary | ICD-10-CM | POA: Diagnosis not present

## 2021-04-01 DIAGNOSIS — N184 Chronic kidney disease, stage 4 (severe): Secondary | ICD-10-CM | POA: Diagnosis not present

## 2021-04-01 DIAGNOSIS — E559 Vitamin D deficiency, unspecified: Secondary | ICD-10-CM | POA: Diagnosis not present

## 2021-04-01 DIAGNOSIS — Z7982 Long term (current) use of aspirin: Secondary | ICD-10-CM | POA: Diagnosis not present

## 2021-04-04 DIAGNOSIS — E1122 Type 2 diabetes mellitus with diabetic chronic kidney disease: Secondary | ICD-10-CM | POA: Diagnosis not present

## 2021-04-04 DIAGNOSIS — N184 Chronic kidney disease, stage 4 (severe): Secondary | ICD-10-CM | POA: Diagnosis not present

## 2021-04-04 DIAGNOSIS — I130 Hypertensive heart and chronic kidney disease with heart failure and stage 1 through stage 4 chronic kidney disease, or unspecified chronic kidney disease: Secondary | ICD-10-CM | POA: Diagnosis not present

## 2021-04-04 DIAGNOSIS — I5032 Chronic diastolic (congestive) heart failure: Secondary | ICD-10-CM | POA: Diagnosis not present

## 2021-04-04 DIAGNOSIS — J449 Chronic obstructive pulmonary disease, unspecified: Secondary | ICD-10-CM | POA: Diagnosis not present

## 2021-04-04 DIAGNOSIS — D500 Iron deficiency anemia secondary to blood loss (chronic): Secondary | ICD-10-CM | POA: Diagnosis not present

## 2021-04-05 DIAGNOSIS — J449 Chronic obstructive pulmonary disease, unspecified: Secondary | ICD-10-CM | POA: Diagnosis not present

## 2021-04-05 DIAGNOSIS — D500 Iron deficiency anemia secondary to blood loss (chronic): Secondary | ICD-10-CM | POA: Diagnosis not present

## 2021-04-05 DIAGNOSIS — N184 Chronic kidney disease, stage 4 (severe): Secondary | ICD-10-CM | POA: Diagnosis not present

## 2021-04-05 DIAGNOSIS — I5032 Chronic diastolic (congestive) heart failure: Secondary | ICD-10-CM | POA: Diagnosis not present

## 2021-04-05 DIAGNOSIS — I130 Hypertensive heart and chronic kidney disease with heart failure and stage 1 through stage 4 chronic kidney disease, or unspecified chronic kidney disease: Secondary | ICD-10-CM | POA: Diagnosis not present

## 2021-04-05 DIAGNOSIS — E1122 Type 2 diabetes mellitus with diabetic chronic kidney disease: Secondary | ICD-10-CM | POA: Diagnosis not present

## 2021-04-06 DIAGNOSIS — I130 Hypertensive heart and chronic kidney disease with heart failure and stage 1 through stage 4 chronic kidney disease, or unspecified chronic kidney disease: Secondary | ICD-10-CM | POA: Diagnosis not present

## 2021-04-06 DIAGNOSIS — E1122 Type 2 diabetes mellitus with diabetic chronic kidney disease: Secondary | ICD-10-CM | POA: Diagnosis not present

## 2021-04-06 DIAGNOSIS — I5032 Chronic diastolic (congestive) heart failure: Secondary | ICD-10-CM | POA: Diagnosis not present

## 2021-04-06 DIAGNOSIS — N184 Chronic kidney disease, stage 4 (severe): Secondary | ICD-10-CM | POA: Diagnosis not present

## 2021-04-06 DIAGNOSIS — J449 Chronic obstructive pulmonary disease, unspecified: Secondary | ICD-10-CM | POA: Diagnosis not present

## 2021-04-06 DIAGNOSIS — D500 Iron deficiency anemia secondary to blood loss (chronic): Secondary | ICD-10-CM | POA: Diagnosis not present

## 2021-04-07 DIAGNOSIS — I130 Hypertensive heart and chronic kidney disease with heart failure and stage 1 through stage 4 chronic kidney disease, or unspecified chronic kidney disease: Secondary | ICD-10-CM | POA: Diagnosis not present

## 2021-04-07 DIAGNOSIS — N184 Chronic kidney disease, stage 4 (severe): Secondary | ICD-10-CM | POA: Diagnosis not present

## 2021-04-07 DIAGNOSIS — E1122 Type 2 diabetes mellitus with diabetic chronic kidney disease: Secondary | ICD-10-CM | POA: Diagnosis not present

## 2021-04-07 DIAGNOSIS — J449 Chronic obstructive pulmonary disease, unspecified: Secondary | ICD-10-CM | POA: Diagnosis not present

## 2021-04-07 DIAGNOSIS — I5032 Chronic diastolic (congestive) heart failure: Secondary | ICD-10-CM | POA: Diagnosis not present

## 2021-04-07 DIAGNOSIS — D500 Iron deficiency anemia secondary to blood loss (chronic): Secondary | ICD-10-CM | POA: Diagnosis not present

## 2021-04-08 DIAGNOSIS — N189 Chronic kidney disease, unspecified: Secondary | ICD-10-CM | POA: Diagnosis not present

## 2021-04-08 DIAGNOSIS — D529 Folate deficiency anemia, unspecified: Secondary | ICD-10-CM | POA: Diagnosis not present

## 2021-04-08 DIAGNOSIS — D519 Vitamin B12 deficiency anemia, unspecified: Secondary | ICD-10-CM | POA: Diagnosis not present

## 2021-04-12 DIAGNOSIS — I5032 Chronic diastolic (congestive) heart failure: Secondary | ICD-10-CM | POA: Diagnosis not present

## 2021-04-12 DIAGNOSIS — D500 Iron deficiency anemia secondary to blood loss (chronic): Secondary | ICD-10-CM | POA: Diagnosis not present

## 2021-04-12 DIAGNOSIS — E1122 Type 2 diabetes mellitus with diabetic chronic kidney disease: Secondary | ICD-10-CM | POA: Diagnosis not present

## 2021-04-12 DIAGNOSIS — J449 Chronic obstructive pulmonary disease, unspecified: Secondary | ICD-10-CM | POA: Diagnosis not present

## 2021-04-12 DIAGNOSIS — N184 Chronic kidney disease, stage 4 (severe): Secondary | ICD-10-CM | POA: Diagnosis not present

## 2021-04-12 DIAGNOSIS — I130 Hypertensive heart and chronic kidney disease with heart failure and stage 1 through stage 4 chronic kidney disease, or unspecified chronic kidney disease: Secondary | ICD-10-CM | POA: Diagnosis not present

## 2021-04-13 DIAGNOSIS — J449 Chronic obstructive pulmonary disease, unspecified: Secondary | ICD-10-CM | POA: Diagnosis not present

## 2021-04-13 DIAGNOSIS — I130 Hypertensive heart and chronic kidney disease with heart failure and stage 1 through stage 4 chronic kidney disease, or unspecified chronic kidney disease: Secondary | ICD-10-CM | POA: Diagnosis not present

## 2021-04-13 DIAGNOSIS — N184 Chronic kidney disease, stage 4 (severe): Secondary | ICD-10-CM | POA: Diagnosis not present

## 2021-04-13 DIAGNOSIS — E1122 Type 2 diabetes mellitus with diabetic chronic kidney disease: Secondary | ICD-10-CM | POA: Diagnosis not present

## 2021-04-13 DIAGNOSIS — D500 Iron deficiency anemia secondary to blood loss (chronic): Secondary | ICD-10-CM | POA: Diagnosis not present

## 2021-04-13 DIAGNOSIS — I5032 Chronic diastolic (congestive) heart failure: Secondary | ICD-10-CM | POA: Diagnosis not present

## 2021-04-15 DIAGNOSIS — I130 Hypertensive heart and chronic kidney disease with heart failure and stage 1 through stage 4 chronic kidney disease, or unspecified chronic kidney disease: Secondary | ICD-10-CM | POA: Diagnosis not present

## 2021-04-15 DIAGNOSIS — I5032 Chronic diastolic (congestive) heart failure: Secondary | ICD-10-CM | POA: Diagnosis not present

## 2021-04-15 DIAGNOSIS — J449 Chronic obstructive pulmonary disease, unspecified: Secondary | ICD-10-CM | POA: Diagnosis not present

## 2021-04-15 DIAGNOSIS — E1122 Type 2 diabetes mellitus with diabetic chronic kidney disease: Secondary | ICD-10-CM | POA: Diagnosis not present

## 2021-04-15 DIAGNOSIS — J9621 Acute and chronic respiratory failure with hypoxia: Secondary | ICD-10-CM | POA: Diagnosis not present

## 2021-04-15 DIAGNOSIS — D500 Iron deficiency anemia secondary to blood loss (chronic): Secondary | ICD-10-CM | POA: Diagnosis not present

## 2021-04-15 DIAGNOSIS — N184 Chronic kidney disease, stage 4 (severe): Secondary | ICD-10-CM | POA: Diagnosis not present

## 2021-04-15 DIAGNOSIS — R1310 Dysphagia, unspecified: Secondary | ICD-10-CM | POA: Diagnosis not present

## 2021-04-19 DIAGNOSIS — I5032 Chronic diastolic (congestive) heart failure: Secondary | ICD-10-CM | POA: Diagnosis not present

## 2021-04-19 DIAGNOSIS — D500 Iron deficiency anemia secondary to blood loss (chronic): Secondary | ICD-10-CM | POA: Diagnosis not present

## 2021-04-19 DIAGNOSIS — N184 Chronic kidney disease, stage 4 (severe): Secondary | ICD-10-CM | POA: Diagnosis not present

## 2021-04-19 DIAGNOSIS — I130 Hypertensive heart and chronic kidney disease with heart failure and stage 1 through stage 4 chronic kidney disease, or unspecified chronic kidney disease: Secondary | ICD-10-CM | POA: Diagnosis not present

## 2021-04-19 DIAGNOSIS — J449 Chronic obstructive pulmonary disease, unspecified: Secondary | ICD-10-CM | POA: Diagnosis not present

## 2021-04-19 DIAGNOSIS — E1122 Type 2 diabetes mellitus with diabetic chronic kidney disease: Secondary | ICD-10-CM | POA: Diagnosis not present

## 2021-04-20 DIAGNOSIS — N184 Chronic kidney disease, stage 4 (severe): Secondary | ICD-10-CM | POA: Diagnosis not present

## 2021-04-20 DIAGNOSIS — D500 Iron deficiency anemia secondary to blood loss (chronic): Secondary | ICD-10-CM | POA: Diagnosis not present

## 2021-04-20 DIAGNOSIS — J449 Chronic obstructive pulmonary disease, unspecified: Secondary | ICD-10-CM | POA: Diagnosis not present

## 2021-04-20 DIAGNOSIS — I130 Hypertensive heart and chronic kidney disease with heart failure and stage 1 through stage 4 chronic kidney disease, or unspecified chronic kidney disease: Secondary | ICD-10-CM | POA: Diagnosis not present

## 2021-04-20 DIAGNOSIS — E1122 Type 2 diabetes mellitus with diabetic chronic kidney disease: Secondary | ICD-10-CM | POA: Diagnosis not present

## 2021-04-20 DIAGNOSIS — I5032 Chronic diastolic (congestive) heart failure: Secondary | ICD-10-CM | POA: Diagnosis not present

## 2021-04-26 DIAGNOSIS — I5032 Chronic diastolic (congestive) heart failure: Secondary | ICD-10-CM | POA: Diagnosis not present

## 2021-04-26 DIAGNOSIS — I130 Hypertensive heart and chronic kidney disease with heart failure and stage 1 through stage 4 chronic kidney disease, or unspecified chronic kidney disease: Secondary | ICD-10-CM | POA: Diagnosis not present

## 2021-04-26 DIAGNOSIS — J449 Chronic obstructive pulmonary disease, unspecified: Secondary | ICD-10-CM | POA: Diagnosis not present

## 2021-04-26 DIAGNOSIS — N184 Chronic kidney disease, stage 4 (severe): Secondary | ICD-10-CM | POA: Diagnosis not present

## 2021-04-26 DIAGNOSIS — E1122 Type 2 diabetes mellitus with diabetic chronic kidney disease: Secondary | ICD-10-CM | POA: Diagnosis not present

## 2021-04-26 DIAGNOSIS — D500 Iron deficiency anemia secondary to blood loss (chronic): Secondary | ICD-10-CM | POA: Diagnosis not present

## 2021-04-27 DIAGNOSIS — E1165 Type 2 diabetes mellitus with hyperglycemia: Secondary | ICD-10-CM | POA: Diagnosis not present

## 2021-04-27 DIAGNOSIS — D631 Anemia in chronic kidney disease: Secondary | ICD-10-CM | POA: Diagnosis not present

## 2021-04-27 DIAGNOSIS — D519 Vitamin B12 deficiency anemia, unspecified: Secondary | ICD-10-CM | POA: Diagnosis not present

## 2021-04-27 DIAGNOSIS — I10 Essential (primary) hypertension: Secondary | ICD-10-CM | POA: Diagnosis not present

## 2021-04-27 DIAGNOSIS — E1122 Type 2 diabetes mellitus with diabetic chronic kidney disease: Secondary | ICD-10-CM | POA: Diagnosis not present

## 2021-04-27 DIAGNOSIS — E782 Mixed hyperlipidemia: Secondary | ICD-10-CM | POA: Diagnosis not present

## 2021-04-27 DIAGNOSIS — D529 Folate deficiency anemia, unspecified: Secondary | ICD-10-CM | POA: Diagnosis not present

## 2021-04-27 DIAGNOSIS — K2100 Gastro-esophageal reflux disease with esophagitis, without bleeding: Secondary | ICD-10-CM | POA: Diagnosis not present

## 2021-04-27 DIAGNOSIS — N184 Chronic kidney disease, stage 4 (severe): Secondary | ICD-10-CM | POA: Diagnosis not present

## 2021-04-27 DIAGNOSIS — E7849 Other hyperlipidemia: Secondary | ICD-10-CM | POA: Diagnosis not present

## 2021-04-28 DIAGNOSIS — D500 Iron deficiency anemia secondary to blood loss (chronic): Secondary | ICD-10-CM | POA: Diagnosis not present

## 2021-04-28 DIAGNOSIS — E1122 Type 2 diabetes mellitus with diabetic chronic kidney disease: Secondary | ICD-10-CM | POA: Diagnosis not present

## 2021-04-28 DIAGNOSIS — N184 Chronic kidney disease, stage 4 (severe): Secondary | ICD-10-CM | POA: Diagnosis not present

## 2021-04-28 DIAGNOSIS — I5032 Chronic diastolic (congestive) heart failure: Secondary | ICD-10-CM | POA: Diagnosis not present

## 2021-04-28 DIAGNOSIS — I130 Hypertensive heart and chronic kidney disease with heart failure and stage 1 through stage 4 chronic kidney disease, or unspecified chronic kidney disease: Secondary | ICD-10-CM | POA: Diagnosis not present

## 2021-04-28 DIAGNOSIS — J449 Chronic obstructive pulmonary disease, unspecified: Secondary | ICD-10-CM | POA: Diagnosis not present

## 2021-04-29 DIAGNOSIS — I5032 Chronic diastolic (congestive) heart failure: Secondary | ICD-10-CM | POA: Diagnosis not present

## 2021-04-29 DIAGNOSIS — N184 Chronic kidney disease, stage 4 (severe): Secondary | ICD-10-CM | POA: Diagnosis not present

## 2021-04-29 DIAGNOSIS — E1122 Type 2 diabetes mellitus with diabetic chronic kidney disease: Secondary | ICD-10-CM | POA: Diagnosis not present

## 2021-04-29 DIAGNOSIS — D500 Iron deficiency anemia secondary to blood loss (chronic): Secondary | ICD-10-CM | POA: Diagnosis not present

## 2021-04-29 DIAGNOSIS — I130 Hypertensive heart and chronic kidney disease with heart failure and stage 1 through stage 4 chronic kidney disease, or unspecified chronic kidney disease: Secondary | ICD-10-CM | POA: Diagnosis not present

## 2021-04-29 DIAGNOSIS — J449 Chronic obstructive pulmonary disease, unspecified: Secondary | ICD-10-CM | POA: Diagnosis not present

## 2021-04-30 DIAGNOSIS — I10 Essential (primary) hypertension: Secondary | ICD-10-CM | POA: Diagnosis not present

## 2021-04-30 DIAGNOSIS — E7849 Other hyperlipidemia: Secondary | ICD-10-CM | POA: Diagnosis not present

## 2021-04-30 DIAGNOSIS — J449 Chronic obstructive pulmonary disease, unspecified: Secondary | ICD-10-CM | POA: Diagnosis not present

## 2021-04-30 DIAGNOSIS — D509 Iron deficiency anemia, unspecified: Secondary | ICD-10-CM | POA: Diagnosis not present

## 2021-04-30 DIAGNOSIS — K2100 Gastro-esophageal reflux disease with esophagitis, without bleeding: Secondary | ICD-10-CM | POA: Diagnosis not present

## 2021-04-30 DIAGNOSIS — J9611 Chronic respiratory failure with hypoxia: Secondary | ICD-10-CM | POA: Diagnosis not present

## 2021-04-30 DIAGNOSIS — E1122 Type 2 diabetes mellitus with diabetic chronic kidney disease: Secondary | ICD-10-CM | POA: Diagnosis not present

## 2021-05-01 DIAGNOSIS — N184 Chronic kidney disease, stage 4 (severe): Secondary | ICD-10-CM | POA: Diagnosis not present

## 2021-05-01 DIAGNOSIS — I712 Thoracic aortic aneurysm, without rupture, unspecified: Secondary | ICD-10-CM | POA: Diagnosis not present

## 2021-05-01 DIAGNOSIS — Z87891 Personal history of nicotine dependence: Secondary | ICD-10-CM | POA: Diagnosis not present

## 2021-05-01 DIAGNOSIS — Z9181 History of falling: Secondary | ICD-10-CM | POA: Diagnosis not present

## 2021-05-01 DIAGNOSIS — E785 Hyperlipidemia, unspecified: Secondary | ICD-10-CM | POA: Diagnosis not present

## 2021-05-01 DIAGNOSIS — K219 Gastro-esophageal reflux disease without esophagitis: Secondary | ICD-10-CM | POA: Diagnosis not present

## 2021-05-01 DIAGNOSIS — E1122 Type 2 diabetes mellitus with diabetic chronic kidney disease: Secondary | ICD-10-CM | POA: Diagnosis not present

## 2021-05-01 DIAGNOSIS — J9621 Acute and chronic respiratory failure with hypoxia: Secondary | ICD-10-CM | POA: Diagnosis not present

## 2021-05-01 DIAGNOSIS — M1990 Unspecified osteoarthritis, unspecified site: Secondary | ICD-10-CM | POA: Diagnosis not present

## 2021-05-01 DIAGNOSIS — R1310 Dysphagia, unspecified: Secondary | ICD-10-CM | POA: Diagnosis not present

## 2021-05-01 DIAGNOSIS — Z7982 Long term (current) use of aspirin: Secondary | ICD-10-CM | POA: Diagnosis not present

## 2021-05-01 DIAGNOSIS — I130 Hypertensive heart and chronic kidney disease with heart failure and stage 1 through stage 4 chronic kidney disease, or unspecified chronic kidney disease: Secondary | ICD-10-CM | POA: Diagnosis not present

## 2021-05-01 DIAGNOSIS — D500 Iron deficiency anemia secondary to blood loss (chronic): Secondary | ICD-10-CM | POA: Diagnosis not present

## 2021-05-01 DIAGNOSIS — F419 Anxiety disorder, unspecified: Secondary | ICD-10-CM | POA: Diagnosis not present

## 2021-05-01 DIAGNOSIS — Z7951 Long term (current) use of inhaled steroids: Secondary | ICD-10-CM | POA: Diagnosis not present

## 2021-05-01 DIAGNOSIS — E538 Deficiency of other specified B group vitamins: Secondary | ICD-10-CM | POA: Diagnosis not present

## 2021-05-01 DIAGNOSIS — J449 Chronic obstructive pulmonary disease, unspecified: Secondary | ICD-10-CM | POA: Diagnosis not present

## 2021-05-01 DIAGNOSIS — K5790 Diverticulosis of intestine, part unspecified, without perforation or abscess without bleeding: Secondary | ICD-10-CM | POA: Diagnosis not present

## 2021-05-01 DIAGNOSIS — Z8673 Personal history of transient ischemic attack (TIA), and cerebral infarction without residual deficits: Secondary | ICD-10-CM | POA: Diagnosis not present

## 2021-05-01 DIAGNOSIS — E039 Hypothyroidism, unspecified: Secondary | ICD-10-CM | POA: Diagnosis not present

## 2021-05-01 DIAGNOSIS — Z7984 Long term (current) use of oral hypoglycemic drugs: Secondary | ICD-10-CM | POA: Diagnosis not present

## 2021-05-01 DIAGNOSIS — I5032 Chronic diastolic (congestive) heart failure: Secondary | ICD-10-CM | POA: Diagnosis not present

## 2021-05-01 DIAGNOSIS — E1142 Type 2 diabetes mellitus with diabetic polyneuropathy: Secondary | ICD-10-CM | POA: Diagnosis not present

## 2021-05-01 DIAGNOSIS — E559 Vitamin D deficiency, unspecified: Secondary | ICD-10-CM | POA: Diagnosis not present

## 2021-05-01 DIAGNOSIS — Z9981 Dependence on supplemental oxygen: Secondary | ICD-10-CM | POA: Diagnosis not present

## 2021-05-03 DIAGNOSIS — J449 Chronic obstructive pulmonary disease, unspecified: Secondary | ICD-10-CM | POA: Diagnosis not present

## 2021-05-03 DIAGNOSIS — N184 Chronic kidney disease, stage 4 (severe): Secondary | ICD-10-CM | POA: Diagnosis not present

## 2021-05-03 DIAGNOSIS — E1122 Type 2 diabetes mellitus with diabetic chronic kidney disease: Secondary | ICD-10-CM | POA: Diagnosis not present

## 2021-05-03 DIAGNOSIS — D500 Iron deficiency anemia secondary to blood loss (chronic): Secondary | ICD-10-CM | POA: Diagnosis not present

## 2021-05-03 DIAGNOSIS — I130 Hypertensive heart and chronic kidney disease with heart failure and stage 1 through stage 4 chronic kidney disease, or unspecified chronic kidney disease: Secondary | ICD-10-CM | POA: Diagnosis not present

## 2021-05-03 DIAGNOSIS — I5032 Chronic diastolic (congestive) heart failure: Secondary | ICD-10-CM | POA: Diagnosis not present

## 2021-05-04 DIAGNOSIS — E785 Hyperlipidemia, unspecified: Secondary | ICD-10-CM | POA: Diagnosis not present

## 2021-05-04 DIAGNOSIS — Z20822 Contact with and (suspected) exposure to covid-19: Secondary | ICD-10-CM | POA: Diagnosis not present

## 2021-05-04 DIAGNOSIS — Z794 Long term (current) use of insulin: Secondary | ICD-10-CM | POA: Diagnosis not present

## 2021-05-04 DIAGNOSIS — Z66 Do not resuscitate: Secondary | ICD-10-CM | POA: Diagnosis not present

## 2021-05-04 DIAGNOSIS — N3001 Acute cystitis with hematuria: Secondary | ICD-10-CM | POA: Diagnosis not present

## 2021-05-04 DIAGNOSIS — E063 Autoimmune thyroiditis: Secondary | ICD-10-CM | POA: Diagnosis not present

## 2021-05-04 DIAGNOSIS — M5136 Other intervertebral disc degeneration, lumbar region: Secondary | ICD-10-CM | POA: Diagnosis not present

## 2021-05-04 DIAGNOSIS — R1111 Vomiting without nausea: Secondary | ICD-10-CM | POA: Diagnosis not present

## 2021-05-04 DIAGNOSIS — R1030 Lower abdominal pain, unspecified: Secondary | ICD-10-CM | POA: Diagnosis not present

## 2021-05-04 DIAGNOSIS — D508 Other iron deficiency anemias: Secondary | ICD-10-CM | POA: Diagnosis not present

## 2021-05-04 DIAGNOSIS — B952 Enterococcus as the cause of diseases classified elsewhere: Secondary | ICD-10-CM | POA: Diagnosis not present

## 2021-05-04 DIAGNOSIS — E860 Dehydration: Secondary | ICD-10-CM | POA: Diagnosis not present

## 2021-05-04 DIAGNOSIS — A09 Infectious gastroenteritis and colitis, unspecified: Secondary | ICD-10-CM | POA: Diagnosis not present

## 2021-05-04 DIAGNOSIS — G609 Hereditary and idiopathic neuropathy, unspecified: Secondary | ICD-10-CM | POA: Diagnosis not present

## 2021-05-04 DIAGNOSIS — I517 Cardiomegaly: Secondary | ICD-10-CM | POA: Diagnosis not present

## 2021-05-04 DIAGNOSIS — D509 Iron deficiency anemia, unspecified: Secondary | ICD-10-CM | POA: Diagnosis not present

## 2021-05-04 DIAGNOSIS — K529 Noninfective gastroenteritis and colitis, unspecified: Secondary | ICD-10-CM | POA: Diagnosis not present

## 2021-05-04 DIAGNOSIS — J90 Pleural effusion, not elsewhere classified: Secondary | ICD-10-CM | POA: Diagnosis not present

## 2021-05-04 DIAGNOSIS — I130 Hypertensive heart and chronic kidney disease with heart failure and stage 1 through stage 4 chronic kidney disease, or unspecified chronic kidney disease: Secondary | ICD-10-CM | POA: Diagnosis not present

## 2021-05-04 DIAGNOSIS — E1151 Type 2 diabetes mellitus with diabetic peripheral angiopathy without gangrene: Secondary | ICD-10-CM | POA: Diagnosis not present

## 2021-05-04 DIAGNOSIS — E440 Moderate protein-calorie malnutrition: Secondary | ICD-10-CM | POA: Diagnosis not present

## 2021-05-04 DIAGNOSIS — E1122 Type 2 diabetes mellitus with diabetic chronic kidney disease: Secondary | ICD-10-CM | POA: Diagnosis not present

## 2021-05-04 DIAGNOSIS — N184 Chronic kidney disease, stage 4 (severe): Secondary | ICD-10-CM | POA: Diagnosis not present

## 2021-05-04 DIAGNOSIS — E039 Hypothyroidism, unspecified: Secondary | ICD-10-CM | POA: Diagnosis not present

## 2021-05-04 DIAGNOSIS — I10 Essential (primary) hypertension: Secondary | ICD-10-CM | POA: Diagnosis not present

## 2021-05-04 DIAGNOSIS — I5032 Chronic diastolic (congestive) heart failure: Secondary | ICD-10-CM | POA: Diagnosis not present

## 2021-05-04 DIAGNOSIS — E1142 Type 2 diabetes mellitus with diabetic polyneuropathy: Secondary | ICD-10-CM | POA: Diagnosis not present

## 2021-05-04 DIAGNOSIS — K219 Gastro-esophageal reflux disease without esophagitis: Secondary | ICD-10-CM | POA: Diagnosis not present

## 2021-05-04 DIAGNOSIS — K559 Vascular disorder of intestine, unspecified: Secondary | ICD-10-CM | POA: Diagnosis not present

## 2021-05-04 DIAGNOSIS — R000 Tachycardia, unspecified: Secondary | ICD-10-CM | POA: Diagnosis not present

## 2021-05-04 DIAGNOSIS — R0689 Other abnormalities of breathing: Secondary | ICD-10-CM | POA: Diagnosis not present

## 2021-05-04 DIAGNOSIS — I4891 Unspecified atrial fibrillation: Secondary | ICD-10-CM | POA: Diagnosis not present

## 2021-05-04 DIAGNOSIS — J9611 Chronic respiratory failure with hypoxia: Secondary | ICD-10-CM | POA: Diagnosis not present

## 2021-05-04 DIAGNOSIS — E876 Hypokalemia: Secondary | ICD-10-CM | POA: Diagnosis not present

## 2021-05-15 DIAGNOSIS — N184 Chronic kidney disease, stage 4 (severe): Secondary | ICD-10-CM | POA: Diagnosis not present

## 2021-05-15 DIAGNOSIS — E1122 Type 2 diabetes mellitus with diabetic chronic kidney disease: Secondary | ICD-10-CM | POA: Diagnosis not present

## 2021-05-15 DIAGNOSIS — Z87891 Personal history of nicotine dependence: Secondary | ICD-10-CM | POA: Diagnosis not present

## 2021-05-15 DIAGNOSIS — I10 Essential (primary) hypertension: Secondary | ICD-10-CM | POA: Diagnosis not present

## 2021-05-15 DIAGNOSIS — R531 Weakness: Secondary | ICD-10-CM | POA: Diagnosis not present

## 2021-05-15 DIAGNOSIS — R300 Dysuria: Secondary | ICD-10-CM | POA: Diagnosis not present

## 2021-05-15 DIAGNOSIS — Z882 Allergy status to sulfonamides status: Secondary | ICD-10-CM | POA: Diagnosis not present

## 2021-05-15 DIAGNOSIS — Z79890 Hormone replacement therapy (postmenopausal): Secondary | ICD-10-CM | POA: Diagnosis not present

## 2021-05-15 DIAGNOSIS — R000 Tachycardia, unspecified: Secondary | ICD-10-CM | POA: Diagnosis not present

## 2021-05-15 DIAGNOSIS — M7989 Other specified soft tissue disorders: Secondary | ICD-10-CM | POA: Diagnosis not present

## 2021-05-15 DIAGNOSIS — R059 Cough, unspecified: Secondary | ICD-10-CM | POA: Diagnosis not present

## 2021-05-15 DIAGNOSIS — K219 Gastro-esophageal reflux disease without esophagitis: Secondary | ICD-10-CM | POA: Diagnosis not present

## 2021-05-15 DIAGNOSIS — I5032 Chronic diastolic (congestive) heart failure: Secondary | ICD-10-CM | POA: Diagnosis not present

## 2021-05-15 DIAGNOSIS — Z885 Allergy status to narcotic agent status: Secondary | ICD-10-CM | POA: Diagnosis not present

## 2021-05-15 DIAGNOSIS — G609 Hereditary and idiopathic neuropathy, unspecified: Secondary | ICD-10-CM | POA: Diagnosis not present

## 2021-05-15 DIAGNOSIS — Z8673 Personal history of transient ischemic attack (TIA), and cerebral infarction without residual deficits: Secondary | ICD-10-CM | POA: Diagnosis not present

## 2021-05-15 DIAGNOSIS — E1151 Type 2 diabetes mellitus with diabetic peripheral angiopathy without gangrene: Secondary | ICD-10-CM | POA: Diagnosis not present

## 2021-05-15 DIAGNOSIS — Z91018 Allergy to other foods: Secondary | ICD-10-CM | POA: Diagnosis not present

## 2021-05-15 DIAGNOSIS — Z91010 Allergy to peanuts: Secondary | ICD-10-CM | POA: Diagnosis not present

## 2021-05-15 DIAGNOSIS — J029 Acute pharyngitis, unspecified: Secondary | ICD-10-CM | POA: Diagnosis not present

## 2021-05-15 DIAGNOSIS — E063 Autoimmune thyroiditis: Secondary | ICD-10-CM | POA: Diagnosis not present

## 2021-05-15 DIAGNOSIS — I130 Hypertensive heart and chronic kidney disease with heart failure and stage 1 through stage 4 chronic kidney disease, or unspecified chronic kidney disease: Secondary | ICD-10-CM | POA: Diagnosis not present

## 2021-05-15 DIAGNOSIS — Z880 Allergy status to penicillin: Secondary | ICD-10-CM | POA: Diagnosis not present

## 2021-05-15 DIAGNOSIS — R070 Pain in throat: Secondary | ICD-10-CM | POA: Diagnosis not present

## 2021-05-15 DIAGNOSIS — Z79899 Other long term (current) drug therapy: Secondary | ICD-10-CM | POA: Diagnosis not present

## 2021-05-17 DIAGNOSIS — E119 Type 2 diabetes mellitus without complications: Secondary | ICD-10-CM | POA: Diagnosis not present

## 2021-05-17 DIAGNOSIS — I509 Heart failure, unspecified: Secondary | ICD-10-CM | POA: Diagnosis not present

## 2021-05-17 DIAGNOSIS — I10 Essential (primary) hypertension: Secondary | ICD-10-CM | POA: Diagnosis not present

## 2021-05-17 DIAGNOSIS — F419 Anxiety disorder, unspecified: Secondary | ICD-10-CM | POA: Diagnosis not present

## 2021-05-17 DIAGNOSIS — E039 Hypothyroidism, unspecified: Secondary | ICD-10-CM | POA: Diagnosis not present

## 2021-05-17 DIAGNOSIS — R52 Pain, unspecified: Secondary | ICD-10-CM | POA: Diagnosis not present

## 2021-05-17 DIAGNOSIS — J449 Chronic obstructive pulmonary disease, unspecified: Secondary | ICD-10-CM | POA: Diagnosis not present

## 2021-05-17 DIAGNOSIS — K219 Gastro-esophageal reflux disease without esophagitis: Secondary | ICD-10-CM | POA: Diagnosis not present

## 2021-05-17 DIAGNOSIS — E785 Hyperlipidemia, unspecified: Secondary | ICD-10-CM | POA: Diagnosis not present

## 2021-05-17 DIAGNOSIS — N183 Chronic kidney disease, stage 3 unspecified: Secondary | ICD-10-CM | POA: Diagnosis not present

## 2021-05-17 DIAGNOSIS — K5900 Constipation, unspecified: Secondary | ICD-10-CM | POA: Diagnosis not present

## 2021-05-18 ENCOUNTER — Other Ambulatory Visit (HOSPITAL_COMMUNITY)
Admission: RE | Admit: 2021-05-18 | Discharge: 2021-05-18 | Disposition: A | Discharge status: Home / Self Care | Payer: Medicare Other | Source: Hospice | Attending: Family Medicine | Admitting: Family Medicine

## 2021-05-18 DIAGNOSIS — R52 Pain, unspecified: Secondary | ICD-10-CM | POA: Diagnosis not present

## 2021-05-18 DIAGNOSIS — E039 Hypothyroidism, unspecified: Secondary | ICD-10-CM | POA: Diagnosis not present

## 2021-05-18 DIAGNOSIS — J449 Chronic obstructive pulmonary disease, unspecified: Secondary | ICD-10-CM | POA: Diagnosis not present

## 2021-05-18 DIAGNOSIS — I10 Essential (primary) hypertension: Secondary | ICD-10-CM | POA: Diagnosis not present

## 2021-05-18 DIAGNOSIS — E119 Type 2 diabetes mellitus without complications: Secondary | ICD-10-CM | POA: Diagnosis not present

## 2021-05-18 DIAGNOSIS — K219 Gastro-esophageal reflux disease without esophagitis: Secondary | ICD-10-CM | POA: Diagnosis not present

## 2021-05-18 LAB — URINALYSIS, ROUTINE W REFLEX MICROSCOPIC
Bacteria, UA: NONE SEEN
Bilirubin Urine: NEGATIVE
Glucose, UA: NEGATIVE mg/dL
Ketones, ur: NEGATIVE mg/dL
Leukocytes,Ua: NEGATIVE
Nitrite: NEGATIVE
Protein, ur: NEGATIVE mg/dL
Specific Gravity, Urine: 1.008 (ref 1.005–1.030)
pH: 7 (ref 5.0–8.0)

## 2021-05-19 DIAGNOSIS — R52 Pain, unspecified: Secondary | ICD-10-CM | POA: Diagnosis not present

## 2021-05-19 DIAGNOSIS — J449 Chronic obstructive pulmonary disease, unspecified: Secondary | ICD-10-CM | POA: Diagnosis not present

## 2021-05-19 DIAGNOSIS — E039 Hypothyroidism, unspecified: Secondary | ICD-10-CM | POA: Diagnosis not present

## 2021-05-19 DIAGNOSIS — E119 Type 2 diabetes mellitus without complications: Secondary | ICD-10-CM | POA: Diagnosis not present

## 2021-05-19 DIAGNOSIS — I10 Essential (primary) hypertension: Secondary | ICD-10-CM | POA: Diagnosis not present

## 2021-05-19 DIAGNOSIS — K219 Gastro-esophageal reflux disease without esophagitis: Secondary | ICD-10-CM | POA: Diagnosis not present

## 2021-05-20 DIAGNOSIS — E119 Type 2 diabetes mellitus without complications: Secondary | ICD-10-CM | POA: Diagnosis not present

## 2021-05-20 DIAGNOSIS — E039 Hypothyroidism, unspecified: Secondary | ICD-10-CM | POA: Diagnosis not present

## 2021-05-20 DIAGNOSIS — I10 Essential (primary) hypertension: Secondary | ICD-10-CM | POA: Diagnosis not present

## 2021-05-20 DIAGNOSIS — R52 Pain, unspecified: Secondary | ICD-10-CM | POA: Diagnosis not present

## 2021-05-20 DIAGNOSIS — J449 Chronic obstructive pulmonary disease, unspecified: Secondary | ICD-10-CM | POA: Diagnosis not present

## 2021-05-20 DIAGNOSIS — K219 Gastro-esophageal reflux disease without esophagitis: Secondary | ICD-10-CM | POA: Diagnosis not present

## 2021-05-23 DIAGNOSIS — I10 Essential (primary) hypertension: Secondary | ICD-10-CM | POA: Diagnosis not present

## 2021-05-23 DIAGNOSIS — R52 Pain, unspecified: Secondary | ICD-10-CM | POA: Diagnosis not present

## 2021-05-23 DIAGNOSIS — J449 Chronic obstructive pulmonary disease, unspecified: Secondary | ICD-10-CM | POA: Diagnosis not present

## 2021-05-23 DIAGNOSIS — K219 Gastro-esophageal reflux disease without esophagitis: Secondary | ICD-10-CM | POA: Diagnosis not present

## 2021-05-23 DIAGNOSIS — E119 Type 2 diabetes mellitus without complications: Secondary | ICD-10-CM | POA: Diagnosis not present

## 2021-05-23 DIAGNOSIS — E039 Hypothyroidism, unspecified: Secondary | ICD-10-CM | POA: Diagnosis not present

## 2021-05-27 DIAGNOSIS — E039 Hypothyroidism, unspecified: Secondary | ICD-10-CM | POA: Diagnosis not present

## 2021-05-27 DIAGNOSIS — J449 Chronic obstructive pulmonary disease, unspecified: Secondary | ICD-10-CM | POA: Diagnosis not present

## 2021-05-27 DIAGNOSIS — E119 Type 2 diabetes mellitus without complications: Secondary | ICD-10-CM | POA: Diagnosis not present

## 2021-05-27 DIAGNOSIS — I10 Essential (primary) hypertension: Secondary | ICD-10-CM | POA: Diagnosis not present

## 2021-05-27 DIAGNOSIS — R52 Pain, unspecified: Secondary | ICD-10-CM | POA: Diagnosis not present

## 2021-05-27 DIAGNOSIS — K219 Gastro-esophageal reflux disease without esophagitis: Secondary | ICD-10-CM | POA: Diagnosis not present

## 2021-05-30 DIAGNOSIS — E119 Type 2 diabetes mellitus without complications: Secondary | ICD-10-CM | POA: Diagnosis not present

## 2021-05-30 DIAGNOSIS — E039 Hypothyroidism, unspecified: Secondary | ICD-10-CM | POA: Diagnosis not present

## 2021-05-30 DIAGNOSIS — R52 Pain, unspecified: Secondary | ICD-10-CM | POA: Diagnosis not present

## 2021-05-30 DIAGNOSIS — I10 Essential (primary) hypertension: Secondary | ICD-10-CM | POA: Diagnosis not present

## 2021-05-30 DIAGNOSIS — J449 Chronic obstructive pulmonary disease, unspecified: Secondary | ICD-10-CM | POA: Diagnosis not present

## 2021-05-30 DIAGNOSIS — K219 Gastro-esophageal reflux disease without esophagitis: Secondary | ICD-10-CM | POA: Diagnosis not present

## 2021-06-03 DIAGNOSIS — E119 Type 2 diabetes mellitus without complications: Secondary | ICD-10-CM | POA: Diagnosis not present

## 2021-06-03 DIAGNOSIS — I10 Essential (primary) hypertension: Secondary | ICD-10-CM | POA: Diagnosis not present

## 2021-06-03 DIAGNOSIS — R52 Pain, unspecified: Secondary | ICD-10-CM | POA: Diagnosis not present

## 2021-06-03 DIAGNOSIS — E039 Hypothyroidism, unspecified: Secondary | ICD-10-CM | POA: Diagnosis not present

## 2021-06-03 DIAGNOSIS — K219 Gastro-esophageal reflux disease without esophagitis: Secondary | ICD-10-CM | POA: Diagnosis not present

## 2021-06-03 DIAGNOSIS — J449 Chronic obstructive pulmonary disease, unspecified: Secondary | ICD-10-CM | POA: Diagnosis not present

## 2021-06-06 DIAGNOSIS — I10 Essential (primary) hypertension: Secondary | ICD-10-CM | POA: Diagnosis not present

## 2021-06-06 DIAGNOSIS — R52 Pain, unspecified: Secondary | ICD-10-CM | POA: Diagnosis not present

## 2021-06-06 DIAGNOSIS — J449 Chronic obstructive pulmonary disease, unspecified: Secondary | ICD-10-CM | POA: Diagnosis not present

## 2021-06-06 DIAGNOSIS — E039 Hypothyroidism, unspecified: Secondary | ICD-10-CM | POA: Diagnosis not present

## 2021-06-06 DIAGNOSIS — K219 Gastro-esophageal reflux disease without esophagitis: Secondary | ICD-10-CM | POA: Diagnosis not present

## 2021-06-06 DIAGNOSIS — E119 Type 2 diabetes mellitus without complications: Secondary | ICD-10-CM | POA: Diagnosis not present

## 2021-06-09 DIAGNOSIS — J449 Chronic obstructive pulmonary disease, unspecified: Secondary | ICD-10-CM | POA: Diagnosis not present

## 2021-06-09 DIAGNOSIS — E039 Hypothyroidism, unspecified: Secondary | ICD-10-CM | POA: Diagnosis not present

## 2021-06-09 DIAGNOSIS — K219 Gastro-esophageal reflux disease without esophagitis: Secondary | ICD-10-CM | POA: Diagnosis not present

## 2021-06-09 DIAGNOSIS — R52 Pain, unspecified: Secondary | ICD-10-CM | POA: Diagnosis not present

## 2021-06-09 DIAGNOSIS — I10 Essential (primary) hypertension: Secondary | ICD-10-CM | POA: Diagnosis not present

## 2021-06-09 DIAGNOSIS — E119 Type 2 diabetes mellitus without complications: Secondary | ICD-10-CM | POA: Diagnosis not present

## 2021-06-12 DIAGNOSIS — R300 Dysuria: Secondary | ICD-10-CM | POA: Diagnosis not present

## 2021-06-12 DIAGNOSIS — K529 Noninfective gastroenteritis and colitis, unspecified: Secondary | ICD-10-CM | POA: Diagnosis not present

## 2021-06-13 DIAGNOSIS — J449 Chronic obstructive pulmonary disease, unspecified: Secondary | ICD-10-CM | POA: Diagnosis not present

## 2021-06-13 DIAGNOSIS — N183 Chronic kidney disease, stage 3 unspecified: Secondary | ICD-10-CM | POA: Diagnosis not present

## 2021-06-13 DIAGNOSIS — R52 Pain, unspecified: Secondary | ICD-10-CM | POA: Diagnosis not present

## 2021-06-13 DIAGNOSIS — I10 Essential (primary) hypertension: Secondary | ICD-10-CM | POA: Diagnosis not present

## 2021-06-13 DIAGNOSIS — K219 Gastro-esophageal reflux disease without esophagitis: Secondary | ICD-10-CM | POA: Diagnosis not present

## 2021-06-13 DIAGNOSIS — I509 Heart failure, unspecified: Secondary | ICD-10-CM | POA: Diagnosis not present

## 2021-06-13 DIAGNOSIS — E785 Hyperlipidemia, unspecified: Secondary | ICD-10-CM | POA: Diagnosis not present

## 2021-06-13 DIAGNOSIS — K5900 Constipation, unspecified: Secondary | ICD-10-CM | POA: Diagnosis not present

## 2021-06-13 DIAGNOSIS — E039 Hypothyroidism, unspecified: Secondary | ICD-10-CM | POA: Diagnosis not present

## 2021-06-13 DIAGNOSIS — E119 Type 2 diabetes mellitus without complications: Secondary | ICD-10-CM | POA: Diagnosis not present

## 2021-06-13 DIAGNOSIS — F419 Anxiety disorder, unspecified: Secondary | ICD-10-CM | POA: Diagnosis not present

## 2021-06-16 DIAGNOSIS — E039 Hypothyroidism, unspecified: Secondary | ICD-10-CM | POA: Diagnosis not present

## 2021-06-16 DIAGNOSIS — R52 Pain, unspecified: Secondary | ICD-10-CM | POA: Diagnosis not present

## 2021-06-16 DIAGNOSIS — E119 Type 2 diabetes mellitus without complications: Secondary | ICD-10-CM | POA: Diagnosis not present

## 2021-06-16 DIAGNOSIS — I10 Essential (primary) hypertension: Secondary | ICD-10-CM | POA: Diagnosis not present

## 2021-06-16 DIAGNOSIS — K219 Gastro-esophageal reflux disease without esophagitis: Secondary | ICD-10-CM | POA: Diagnosis not present

## 2021-06-16 DIAGNOSIS — J449 Chronic obstructive pulmonary disease, unspecified: Secondary | ICD-10-CM | POA: Diagnosis not present

## 2021-06-20 DIAGNOSIS — E039 Hypothyroidism, unspecified: Secondary | ICD-10-CM | POA: Diagnosis not present

## 2021-06-20 DIAGNOSIS — I10 Essential (primary) hypertension: Secondary | ICD-10-CM | POA: Diagnosis not present

## 2021-06-20 DIAGNOSIS — J449 Chronic obstructive pulmonary disease, unspecified: Secondary | ICD-10-CM | POA: Diagnosis not present

## 2021-06-20 DIAGNOSIS — R52 Pain, unspecified: Secondary | ICD-10-CM | POA: Diagnosis not present

## 2021-06-20 DIAGNOSIS — K219 Gastro-esophageal reflux disease without esophagitis: Secondary | ICD-10-CM | POA: Diagnosis not present

## 2021-06-20 DIAGNOSIS — E119 Type 2 diabetes mellitus without complications: Secondary | ICD-10-CM | POA: Diagnosis not present

## 2021-06-21 DIAGNOSIS — E039 Hypothyroidism, unspecified: Secondary | ICD-10-CM | POA: Diagnosis not present

## 2021-06-21 DIAGNOSIS — J449 Chronic obstructive pulmonary disease, unspecified: Secondary | ICD-10-CM | POA: Diagnosis not present

## 2021-06-21 DIAGNOSIS — K219 Gastro-esophageal reflux disease without esophagitis: Secondary | ICD-10-CM | POA: Diagnosis not present

## 2021-06-21 DIAGNOSIS — R300 Dysuria: Secondary | ICD-10-CM | POA: Diagnosis not present

## 2021-06-21 DIAGNOSIS — E119 Type 2 diabetes mellitus without complications: Secondary | ICD-10-CM | POA: Diagnosis not present

## 2021-06-21 DIAGNOSIS — I10 Essential (primary) hypertension: Secondary | ICD-10-CM | POA: Diagnosis not present

## 2021-06-21 DIAGNOSIS — R52 Pain, unspecified: Secondary | ICD-10-CM | POA: Diagnosis not present

## 2021-06-22 DIAGNOSIS — E119 Type 2 diabetes mellitus without complications: Secondary | ICD-10-CM | POA: Diagnosis not present

## 2021-06-22 DIAGNOSIS — J449 Chronic obstructive pulmonary disease, unspecified: Secondary | ICD-10-CM | POA: Diagnosis not present

## 2021-06-22 DIAGNOSIS — K219 Gastro-esophageal reflux disease without esophagitis: Secondary | ICD-10-CM | POA: Diagnosis not present

## 2021-06-22 DIAGNOSIS — R52 Pain, unspecified: Secondary | ICD-10-CM | POA: Diagnosis not present

## 2021-06-22 DIAGNOSIS — I10 Essential (primary) hypertension: Secondary | ICD-10-CM | POA: Diagnosis not present

## 2021-06-22 DIAGNOSIS — E039 Hypothyroidism, unspecified: Secondary | ICD-10-CM | POA: Diagnosis not present

## 2021-06-24 DIAGNOSIS — K219 Gastro-esophageal reflux disease without esophagitis: Secondary | ICD-10-CM | POA: Diagnosis not present

## 2021-06-24 DIAGNOSIS — E039 Hypothyroidism, unspecified: Secondary | ICD-10-CM | POA: Diagnosis not present

## 2021-06-24 DIAGNOSIS — E119 Type 2 diabetes mellitus without complications: Secondary | ICD-10-CM | POA: Diagnosis not present

## 2021-06-24 DIAGNOSIS — R52 Pain, unspecified: Secondary | ICD-10-CM | POA: Diagnosis not present

## 2021-06-24 DIAGNOSIS — I10 Essential (primary) hypertension: Secondary | ICD-10-CM | POA: Diagnosis not present

## 2021-06-24 DIAGNOSIS — J449 Chronic obstructive pulmonary disease, unspecified: Secondary | ICD-10-CM | POA: Diagnosis not present

## 2021-06-28 DIAGNOSIS — J449 Chronic obstructive pulmonary disease, unspecified: Secondary | ICD-10-CM | POA: Diagnosis not present

## 2021-06-28 DIAGNOSIS — E782 Mixed hyperlipidemia: Secondary | ICD-10-CM | POA: Diagnosis not present

## 2021-06-28 DIAGNOSIS — E039 Hypothyroidism, unspecified: Secondary | ICD-10-CM | POA: Diagnosis not present

## 2021-06-28 DIAGNOSIS — E7849 Other hyperlipidemia: Secondary | ICD-10-CM | POA: Diagnosis not present

## 2021-06-28 DIAGNOSIS — K219 Gastro-esophageal reflux disease without esophagitis: Secondary | ICD-10-CM | POA: Diagnosis not present

## 2021-06-28 DIAGNOSIS — E119 Type 2 diabetes mellitus without complications: Secondary | ICD-10-CM | POA: Diagnosis not present

## 2021-06-28 DIAGNOSIS — R799 Abnormal finding of blood chemistry, unspecified: Secondary | ICD-10-CM | POA: Diagnosis not present

## 2021-06-28 DIAGNOSIS — E875 Hyperkalemia: Secondary | ICD-10-CM | POA: Diagnosis not present

## 2021-06-28 DIAGNOSIS — D649 Anemia, unspecified: Secondary | ICD-10-CM | POA: Diagnosis not present

## 2021-06-28 DIAGNOSIS — I10 Essential (primary) hypertension: Secondary | ICD-10-CM | POA: Diagnosis not present

## 2021-06-28 DIAGNOSIS — R52 Pain, unspecified: Secondary | ICD-10-CM | POA: Diagnosis not present

## 2021-07-04 DIAGNOSIS — R52 Pain, unspecified: Secondary | ICD-10-CM | POA: Diagnosis not present

## 2021-07-04 DIAGNOSIS — E039 Hypothyroidism, unspecified: Secondary | ICD-10-CM | POA: Diagnosis not present

## 2021-07-04 DIAGNOSIS — E119 Type 2 diabetes mellitus without complications: Secondary | ICD-10-CM | POA: Diagnosis not present

## 2021-07-04 DIAGNOSIS — K219 Gastro-esophageal reflux disease without esophagitis: Secondary | ICD-10-CM | POA: Diagnosis not present

## 2021-07-04 DIAGNOSIS — J449 Chronic obstructive pulmonary disease, unspecified: Secondary | ICD-10-CM | POA: Diagnosis not present

## 2021-07-04 DIAGNOSIS — I10 Essential (primary) hypertension: Secondary | ICD-10-CM | POA: Diagnosis not present

## 2021-07-08 DIAGNOSIS — E119 Type 2 diabetes mellitus without complications: Secondary | ICD-10-CM | POA: Diagnosis not present

## 2021-07-08 DIAGNOSIS — I10 Essential (primary) hypertension: Secondary | ICD-10-CM | POA: Diagnosis not present

## 2021-07-08 DIAGNOSIS — J449 Chronic obstructive pulmonary disease, unspecified: Secondary | ICD-10-CM | POA: Diagnosis not present

## 2021-07-08 DIAGNOSIS — R52 Pain, unspecified: Secondary | ICD-10-CM | POA: Diagnosis not present

## 2021-07-08 DIAGNOSIS — E039 Hypothyroidism, unspecified: Secondary | ICD-10-CM | POA: Diagnosis not present

## 2021-07-08 DIAGNOSIS — K219 Gastro-esophageal reflux disease without esophagitis: Secondary | ICD-10-CM | POA: Diagnosis not present

## 2021-07-09 DIAGNOSIS — K219 Gastro-esophageal reflux disease without esophagitis: Secondary | ICD-10-CM | POA: Diagnosis not present

## 2021-07-09 DIAGNOSIS — J449 Chronic obstructive pulmonary disease, unspecified: Secondary | ICD-10-CM | POA: Diagnosis not present

## 2021-07-09 DIAGNOSIS — I10 Essential (primary) hypertension: Secondary | ICD-10-CM | POA: Diagnosis not present

## 2021-07-09 DIAGNOSIS — E039 Hypothyroidism, unspecified: Secondary | ICD-10-CM | POA: Diagnosis not present

## 2021-07-09 DIAGNOSIS — E119 Type 2 diabetes mellitus without complications: Secondary | ICD-10-CM | POA: Diagnosis not present

## 2021-07-09 DIAGNOSIS — R52 Pain, unspecified: Secondary | ICD-10-CM | POA: Diagnosis not present

## 2021-07-11 DIAGNOSIS — E039 Hypothyroidism, unspecified: Secondary | ICD-10-CM | POA: Diagnosis not present

## 2021-07-11 DIAGNOSIS — R52 Pain, unspecified: Secondary | ICD-10-CM | POA: Diagnosis not present

## 2021-07-11 DIAGNOSIS — K219 Gastro-esophageal reflux disease without esophagitis: Secondary | ICD-10-CM | POA: Diagnosis not present

## 2021-07-11 DIAGNOSIS — I10 Essential (primary) hypertension: Secondary | ICD-10-CM | POA: Diagnosis not present

## 2021-07-11 DIAGNOSIS — J449 Chronic obstructive pulmonary disease, unspecified: Secondary | ICD-10-CM | POA: Diagnosis not present

## 2021-07-11 DIAGNOSIS — E119 Type 2 diabetes mellitus without complications: Secondary | ICD-10-CM | POA: Diagnosis not present

## 2021-07-14 DIAGNOSIS — K5900 Constipation, unspecified: Secondary | ICD-10-CM | POA: Diagnosis not present

## 2021-07-14 DIAGNOSIS — F419 Anxiety disorder, unspecified: Secondary | ICD-10-CM | POA: Diagnosis not present

## 2021-07-14 DIAGNOSIS — R52 Pain, unspecified: Secondary | ICD-10-CM | POA: Diagnosis not present

## 2021-07-14 DIAGNOSIS — J449 Chronic obstructive pulmonary disease, unspecified: Secondary | ICD-10-CM | POA: Diagnosis not present

## 2021-07-14 DIAGNOSIS — E119 Type 2 diabetes mellitus without complications: Secondary | ICD-10-CM | POA: Diagnosis not present

## 2021-07-14 DIAGNOSIS — N183 Chronic kidney disease, stage 3 unspecified: Secondary | ICD-10-CM | POA: Diagnosis not present

## 2021-07-14 DIAGNOSIS — I509 Heart failure, unspecified: Secondary | ICD-10-CM | POA: Diagnosis not present

## 2021-07-14 DIAGNOSIS — E039 Hypothyroidism, unspecified: Secondary | ICD-10-CM | POA: Diagnosis not present

## 2021-07-14 DIAGNOSIS — E785 Hyperlipidemia, unspecified: Secondary | ICD-10-CM | POA: Diagnosis not present

## 2021-07-14 DIAGNOSIS — K219 Gastro-esophageal reflux disease without esophagitis: Secondary | ICD-10-CM | POA: Diagnosis not present

## 2021-07-14 DIAGNOSIS — I10 Essential (primary) hypertension: Secondary | ICD-10-CM | POA: Diagnosis not present

## 2021-07-15 DIAGNOSIS — E119 Type 2 diabetes mellitus without complications: Secondary | ICD-10-CM | POA: Diagnosis not present

## 2021-07-15 DIAGNOSIS — K219 Gastro-esophageal reflux disease without esophagitis: Secondary | ICD-10-CM | POA: Diagnosis not present

## 2021-07-15 DIAGNOSIS — J449 Chronic obstructive pulmonary disease, unspecified: Secondary | ICD-10-CM | POA: Diagnosis not present

## 2021-07-15 DIAGNOSIS — E039 Hypothyroidism, unspecified: Secondary | ICD-10-CM | POA: Diagnosis not present

## 2021-07-15 DIAGNOSIS — I10 Essential (primary) hypertension: Secondary | ICD-10-CM | POA: Diagnosis not present

## 2021-07-15 DIAGNOSIS — R52 Pain, unspecified: Secondary | ICD-10-CM | POA: Diagnosis not present

## 2021-07-18 DIAGNOSIS — K219 Gastro-esophageal reflux disease without esophagitis: Secondary | ICD-10-CM | POA: Diagnosis not present

## 2021-07-18 DIAGNOSIS — E119 Type 2 diabetes mellitus without complications: Secondary | ICD-10-CM | POA: Diagnosis not present

## 2021-07-18 DIAGNOSIS — J449 Chronic obstructive pulmonary disease, unspecified: Secondary | ICD-10-CM | POA: Diagnosis not present

## 2021-07-18 DIAGNOSIS — E039 Hypothyroidism, unspecified: Secondary | ICD-10-CM | POA: Diagnosis not present

## 2021-07-18 DIAGNOSIS — R52 Pain, unspecified: Secondary | ICD-10-CM | POA: Diagnosis not present

## 2021-07-18 DIAGNOSIS — I10 Essential (primary) hypertension: Secondary | ICD-10-CM | POA: Diagnosis not present

## 2021-07-21 DIAGNOSIS — R52 Pain, unspecified: Secondary | ICD-10-CM | POA: Diagnosis not present

## 2021-07-21 DIAGNOSIS — E039 Hypothyroidism, unspecified: Secondary | ICD-10-CM | POA: Diagnosis not present

## 2021-07-21 DIAGNOSIS — J449 Chronic obstructive pulmonary disease, unspecified: Secondary | ICD-10-CM | POA: Diagnosis not present

## 2021-07-21 DIAGNOSIS — K219 Gastro-esophageal reflux disease without esophagitis: Secondary | ICD-10-CM | POA: Diagnosis not present

## 2021-07-21 DIAGNOSIS — E119 Type 2 diabetes mellitus without complications: Secondary | ICD-10-CM | POA: Diagnosis not present

## 2021-07-21 DIAGNOSIS — I10 Essential (primary) hypertension: Secondary | ICD-10-CM | POA: Diagnosis not present

## 2021-07-22 DIAGNOSIS — K219 Gastro-esophageal reflux disease without esophagitis: Secondary | ICD-10-CM | POA: Diagnosis not present

## 2021-07-22 DIAGNOSIS — I10 Essential (primary) hypertension: Secondary | ICD-10-CM | POA: Diagnosis not present

## 2021-07-22 DIAGNOSIS — J449 Chronic obstructive pulmonary disease, unspecified: Secondary | ICD-10-CM | POA: Diagnosis not present

## 2021-07-22 DIAGNOSIS — E039 Hypothyroidism, unspecified: Secondary | ICD-10-CM | POA: Diagnosis not present

## 2021-07-22 DIAGNOSIS — R52 Pain, unspecified: Secondary | ICD-10-CM | POA: Diagnosis not present

## 2021-07-22 DIAGNOSIS — E119 Type 2 diabetes mellitus without complications: Secondary | ICD-10-CM | POA: Diagnosis not present

## 2021-07-25 DIAGNOSIS — I10 Essential (primary) hypertension: Secondary | ICD-10-CM | POA: Diagnosis not present

## 2021-07-25 DIAGNOSIS — E119 Type 2 diabetes mellitus without complications: Secondary | ICD-10-CM | POA: Diagnosis not present

## 2021-07-25 DIAGNOSIS — E039 Hypothyroidism, unspecified: Secondary | ICD-10-CM | POA: Diagnosis not present

## 2021-07-25 DIAGNOSIS — K219 Gastro-esophageal reflux disease without esophagitis: Secondary | ICD-10-CM | POA: Diagnosis not present

## 2021-07-25 DIAGNOSIS — R52 Pain, unspecified: Secondary | ICD-10-CM | POA: Diagnosis not present

## 2021-07-25 DIAGNOSIS — J449 Chronic obstructive pulmonary disease, unspecified: Secondary | ICD-10-CM | POA: Diagnosis not present

## 2021-07-28 DIAGNOSIS — I714 Abdominal aortic aneurysm, without rupture, unspecified: Secondary | ICD-10-CM | POA: Diagnosis not present

## 2021-07-28 DIAGNOSIS — I10 Essential (primary) hypertension: Secondary | ICD-10-CM | POA: Diagnosis not present

## 2021-07-28 DIAGNOSIS — E039 Hypothyroidism, unspecified: Secondary | ICD-10-CM | POA: Diagnosis not present

## 2021-07-28 DIAGNOSIS — F331 Major depressive disorder, recurrent, moderate: Secondary | ICD-10-CM | POA: Diagnosis not present

## 2021-07-28 DIAGNOSIS — E1122 Type 2 diabetes mellitus with diabetic chronic kidney disease: Secondary | ICD-10-CM | POA: Diagnosis not present

## 2021-07-28 DIAGNOSIS — L97919 Non-pressure chronic ulcer of unspecified part of right lower leg with unspecified severity: Secondary | ICD-10-CM | POA: Diagnosis not present

## 2021-07-28 DIAGNOSIS — K219 Gastro-esophageal reflux disease without esophagitis: Secondary | ICD-10-CM | POA: Diagnosis not present

## 2021-07-28 DIAGNOSIS — D500 Iron deficiency anemia secondary to blood loss (chronic): Secondary | ICD-10-CM | POA: Diagnosis not present

## 2021-07-28 DIAGNOSIS — J449 Chronic obstructive pulmonary disease, unspecified: Secondary | ICD-10-CM | POA: Diagnosis not present

## 2021-07-28 DIAGNOSIS — R52 Pain, unspecified: Secondary | ICD-10-CM | POA: Diagnosis not present

## 2021-07-28 DIAGNOSIS — E11622 Type 2 diabetes mellitus with other skin ulcer: Secondary | ICD-10-CM | POA: Diagnosis not present

## 2021-07-28 DIAGNOSIS — K2100 Gastro-esophageal reflux disease with esophagitis, without bleeding: Secondary | ICD-10-CM | POA: Diagnosis not present

## 2021-07-28 DIAGNOSIS — E7849 Other hyperlipidemia: Secondary | ICD-10-CM | POA: Diagnosis not present

## 2021-07-28 DIAGNOSIS — J9611 Chronic respiratory failure with hypoxia: Secondary | ICD-10-CM | POA: Diagnosis not present

## 2021-07-28 DIAGNOSIS — E119 Type 2 diabetes mellitus without complications: Secondary | ICD-10-CM | POA: Diagnosis not present

## 2021-08-01 DIAGNOSIS — R52 Pain, unspecified: Secondary | ICD-10-CM | POA: Diagnosis not present

## 2021-08-01 DIAGNOSIS — J449 Chronic obstructive pulmonary disease, unspecified: Secondary | ICD-10-CM | POA: Diagnosis not present

## 2021-08-01 DIAGNOSIS — E119 Type 2 diabetes mellitus without complications: Secondary | ICD-10-CM | POA: Diagnosis not present

## 2021-08-01 DIAGNOSIS — I10 Essential (primary) hypertension: Secondary | ICD-10-CM | POA: Diagnosis not present

## 2021-08-01 DIAGNOSIS — K219 Gastro-esophageal reflux disease without esophagitis: Secondary | ICD-10-CM | POA: Diagnosis not present

## 2021-08-01 DIAGNOSIS — E039 Hypothyroidism, unspecified: Secondary | ICD-10-CM | POA: Diagnosis not present

## 2021-08-04 DIAGNOSIS — R52 Pain, unspecified: Secondary | ICD-10-CM | POA: Diagnosis not present

## 2021-08-04 DIAGNOSIS — E039 Hypothyroidism, unspecified: Secondary | ICD-10-CM | POA: Diagnosis not present

## 2021-08-04 DIAGNOSIS — J449 Chronic obstructive pulmonary disease, unspecified: Secondary | ICD-10-CM | POA: Diagnosis not present

## 2021-08-04 DIAGNOSIS — I10 Essential (primary) hypertension: Secondary | ICD-10-CM | POA: Diagnosis not present

## 2021-08-04 DIAGNOSIS — K219 Gastro-esophageal reflux disease without esophagitis: Secondary | ICD-10-CM | POA: Diagnosis not present

## 2021-08-04 DIAGNOSIS — E119 Type 2 diabetes mellitus without complications: Secondary | ICD-10-CM | POA: Diagnosis not present

## 2021-08-08 DIAGNOSIS — I10 Essential (primary) hypertension: Secondary | ICD-10-CM | POA: Diagnosis not present

## 2021-08-08 DIAGNOSIS — E119 Type 2 diabetes mellitus without complications: Secondary | ICD-10-CM | POA: Diagnosis not present

## 2021-08-08 DIAGNOSIS — J449 Chronic obstructive pulmonary disease, unspecified: Secondary | ICD-10-CM | POA: Diagnosis not present

## 2021-08-08 DIAGNOSIS — E039 Hypothyroidism, unspecified: Secondary | ICD-10-CM | POA: Diagnosis not present

## 2021-08-08 DIAGNOSIS — K219 Gastro-esophageal reflux disease without esophagitis: Secondary | ICD-10-CM | POA: Diagnosis not present

## 2021-08-08 DIAGNOSIS — R52 Pain, unspecified: Secondary | ICD-10-CM | POA: Diagnosis not present

## 2021-08-10 DIAGNOSIS — R52 Pain, unspecified: Secondary | ICD-10-CM | POA: Diagnosis not present

## 2021-08-10 DIAGNOSIS — E119 Type 2 diabetes mellitus without complications: Secondary | ICD-10-CM | POA: Diagnosis not present

## 2021-08-10 DIAGNOSIS — J449 Chronic obstructive pulmonary disease, unspecified: Secondary | ICD-10-CM | POA: Diagnosis not present

## 2021-08-10 DIAGNOSIS — E039 Hypothyroidism, unspecified: Secondary | ICD-10-CM | POA: Diagnosis not present

## 2021-08-10 DIAGNOSIS — I10 Essential (primary) hypertension: Secondary | ICD-10-CM | POA: Diagnosis not present

## 2021-08-10 DIAGNOSIS — K219 Gastro-esophageal reflux disease without esophagitis: Secondary | ICD-10-CM | POA: Diagnosis not present

## 2021-08-12 DIAGNOSIS — I10 Essential (primary) hypertension: Secondary | ICD-10-CM | POA: Diagnosis not present

## 2021-08-12 DIAGNOSIS — J449 Chronic obstructive pulmonary disease, unspecified: Secondary | ICD-10-CM | POA: Diagnosis not present

## 2021-08-12 DIAGNOSIS — E039 Hypothyroidism, unspecified: Secondary | ICD-10-CM | POA: Diagnosis not present

## 2021-08-12 DIAGNOSIS — K219 Gastro-esophageal reflux disease without esophagitis: Secondary | ICD-10-CM | POA: Diagnosis not present

## 2021-08-12 DIAGNOSIS — E119 Type 2 diabetes mellitus without complications: Secondary | ICD-10-CM | POA: Diagnosis not present

## 2021-08-12 DIAGNOSIS — R52 Pain, unspecified: Secondary | ICD-10-CM | POA: Diagnosis not present

## 2021-08-13 DIAGNOSIS — E039 Hypothyroidism, unspecified: Secondary | ICD-10-CM | POA: Diagnosis not present

## 2021-08-13 DIAGNOSIS — F419 Anxiety disorder, unspecified: Secondary | ICD-10-CM | POA: Diagnosis not present

## 2021-08-13 DIAGNOSIS — R52 Pain, unspecified: Secondary | ICD-10-CM | POA: Diagnosis not present

## 2021-08-13 DIAGNOSIS — E785 Hyperlipidemia, unspecified: Secondary | ICD-10-CM | POA: Diagnosis not present

## 2021-08-13 DIAGNOSIS — K219 Gastro-esophageal reflux disease without esophagitis: Secondary | ICD-10-CM | POA: Diagnosis not present

## 2021-08-13 DIAGNOSIS — J449 Chronic obstructive pulmonary disease, unspecified: Secondary | ICD-10-CM | POA: Diagnosis not present

## 2021-08-13 DIAGNOSIS — K5900 Constipation, unspecified: Secondary | ICD-10-CM | POA: Diagnosis not present

## 2021-08-13 DIAGNOSIS — I509 Heart failure, unspecified: Secondary | ICD-10-CM | POA: Diagnosis not present

## 2021-08-13 DIAGNOSIS — I10 Essential (primary) hypertension: Secondary | ICD-10-CM | POA: Diagnosis not present

## 2021-08-13 DIAGNOSIS — E119 Type 2 diabetes mellitus without complications: Secondary | ICD-10-CM | POA: Diagnosis not present

## 2021-08-13 DIAGNOSIS — N183 Chronic kidney disease, stage 3 unspecified: Secondary | ICD-10-CM | POA: Diagnosis not present

## 2021-08-14 DIAGNOSIS — E119 Type 2 diabetes mellitus without complications: Secondary | ICD-10-CM | POA: Diagnosis not present

## 2021-08-14 DIAGNOSIS — E039 Hypothyroidism, unspecified: Secondary | ICD-10-CM | POA: Diagnosis not present

## 2021-08-14 DIAGNOSIS — I10 Essential (primary) hypertension: Secondary | ICD-10-CM | POA: Diagnosis not present

## 2021-08-14 DIAGNOSIS — J449 Chronic obstructive pulmonary disease, unspecified: Secondary | ICD-10-CM | POA: Diagnosis not present

## 2021-08-14 DIAGNOSIS — K219 Gastro-esophageal reflux disease without esophagitis: Secondary | ICD-10-CM | POA: Diagnosis not present

## 2021-08-14 DIAGNOSIS — R52 Pain, unspecified: Secondary | ICD-10-CM | POA: Diagnosis not present

## 2021-08-15 DIAGNOSIS — E039 Hypothyroidism, unspecified: Secondary | ICD-10-CM | POA: Diagnosis not present

## 2021-08-15 DIAGNOSIS — K219 Gastro-esophageal reflux disease without esophagitis: Secondary | ICD-10-CM | POA: Diagnosis not present

## 2021-08-15 DIAGNOSIS — R52 Pain, unspecified: Secondary | ICD-10-CM | POA: Diagnosis not present

## 2021-08-15 DIAGNOSIS — I10 Essential (primary) hypertension: Secondary | ICD-10-CM | POA: Diagnosis not present

## 2021-08-15 DIAGNOSIS — J449 Chronic obstructive pulmonary disease, unspecified: Secondary | ICD-10-CM | POA: Diagnosis not present

## 2021-08-15 DIAGNOSIS — E119 Type 2 diabetes mellitus without complications: Secondary | ICD-10-CM | POA: Diagnosis not present

## 2021-09-13 DEATH — deceased

## 2023-03-08 IMAGING — CT CT ABD-PELV W/ CM
2 of 5 series · 14 of 46 positions shown, 16 images · IV contrast (Omnipaque or Isovue)
Comparison: No direct comparison imaging is available.

CLINICAL DATA: Nausea, vomiting

EXAM:
CT ABDOMEN AND PELVIS WITH CONTRAST
TECHNIQUE: Multidetector CT imaging of the abdomen and pelvis was performed
using the standard protocol following bolus administration of
intravenous contrast.
CONTRAST:  75mL OMNIPAQUE IOHEXOL 300 MG/ML  SOLN

[Series 2: axial st · axial · 0.98mm/px · z∈[+645,+1065]mm · 11 of 96 slices shown, 13 images]
[im 6/96  soft-tissue]
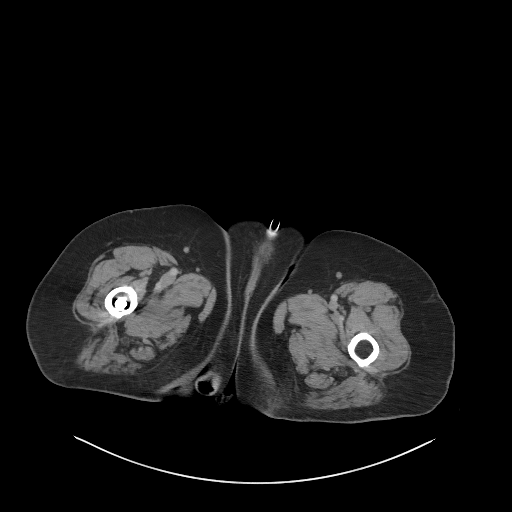
[im 6/96  bone]
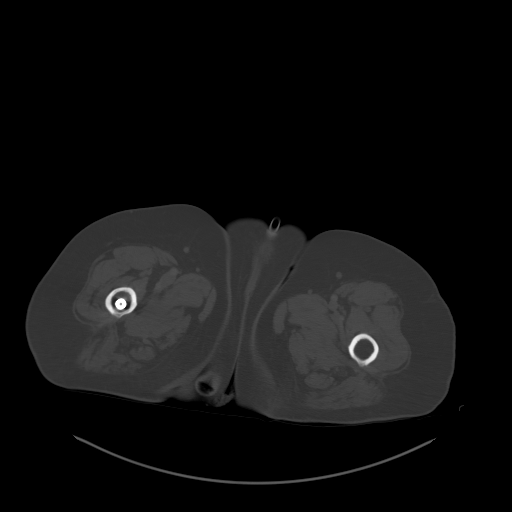
[im 17/96  soft-tissue]
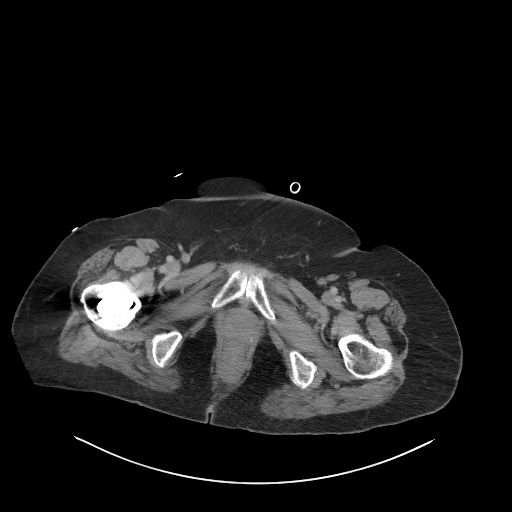
[im 23/96  soft-tissue]
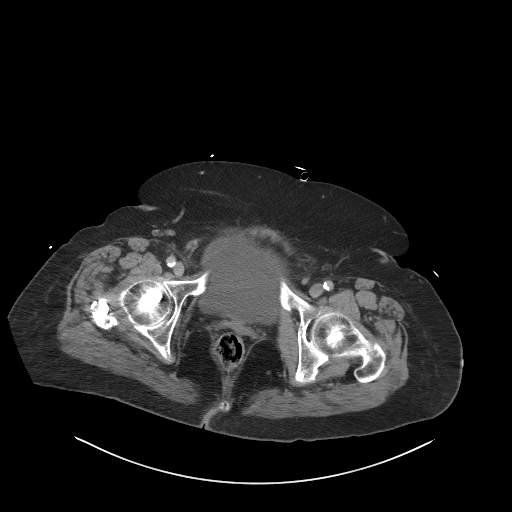
[im 34/96  soft-tissue]
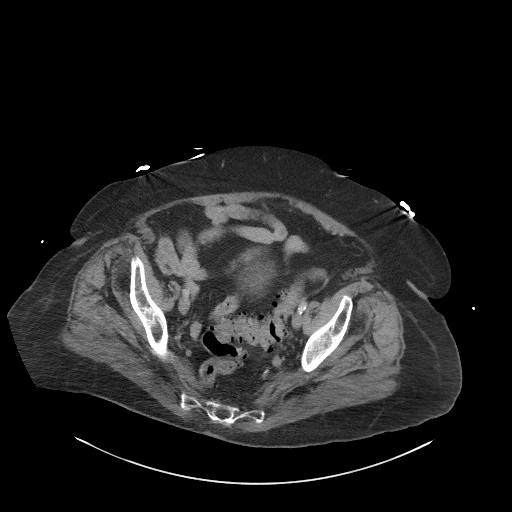
[im 40/96  soft-tissue]
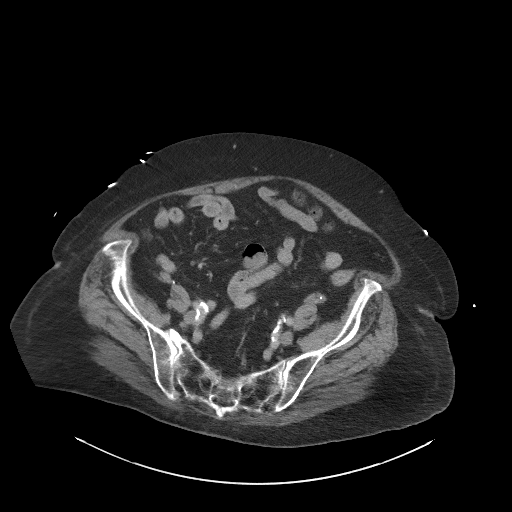
[im 51/96  soft-tissue]
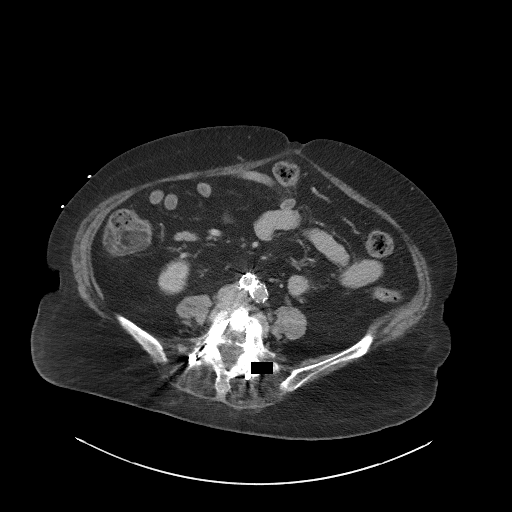
[im 56/96  soft-tissue]
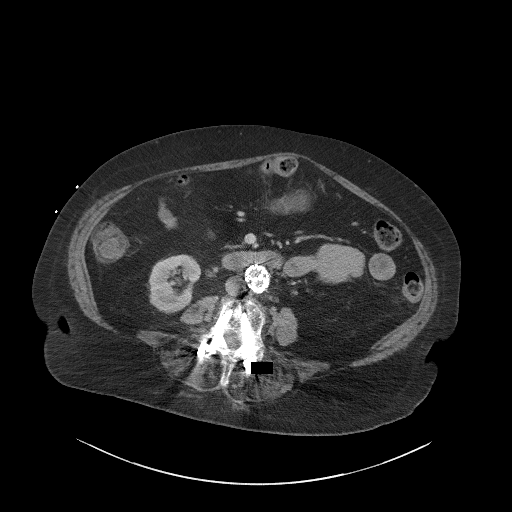
[im 62/96  soft-tissue]
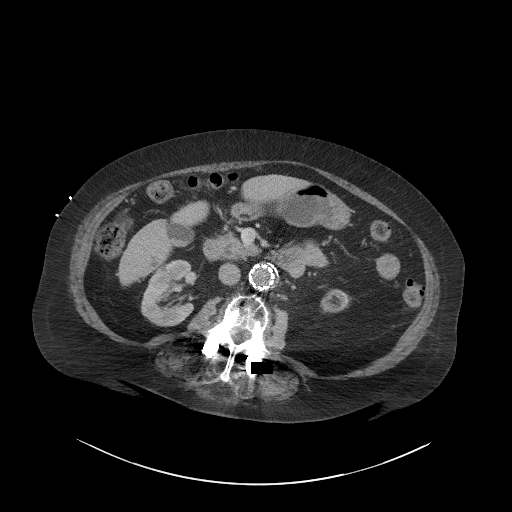
[im 73/96  soft-tissue]
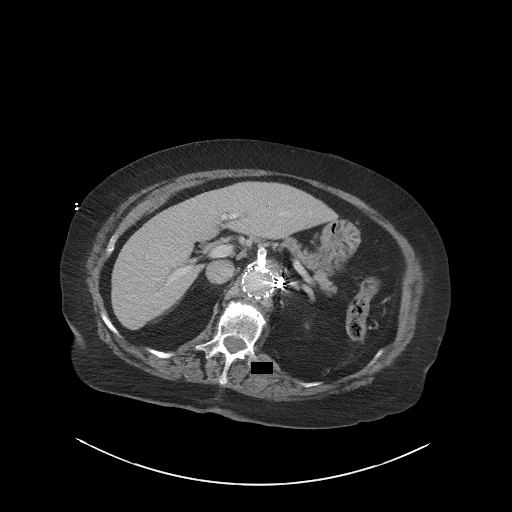
[im 73/96  bone]
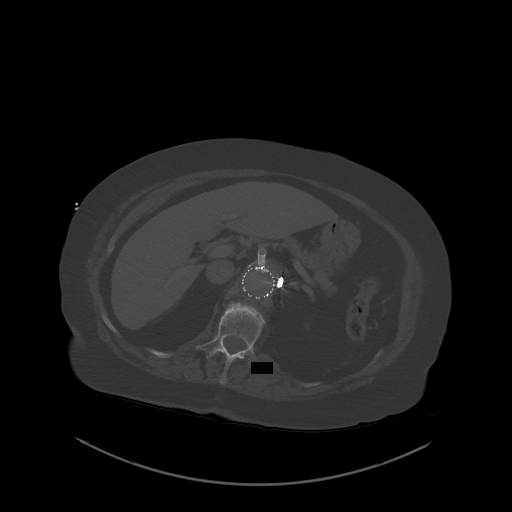
[im 79/96  soft-tissue]
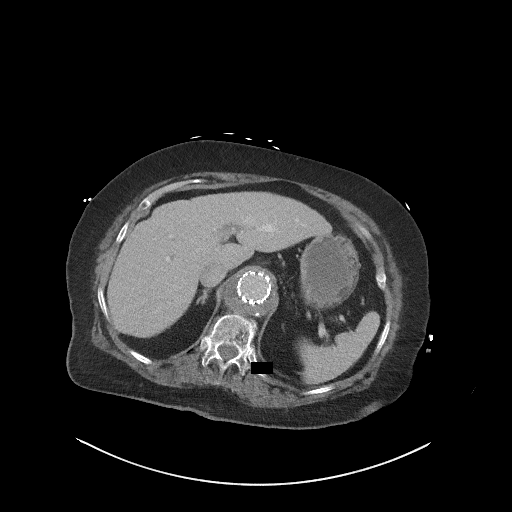
[im 90/96  soft-tissue]
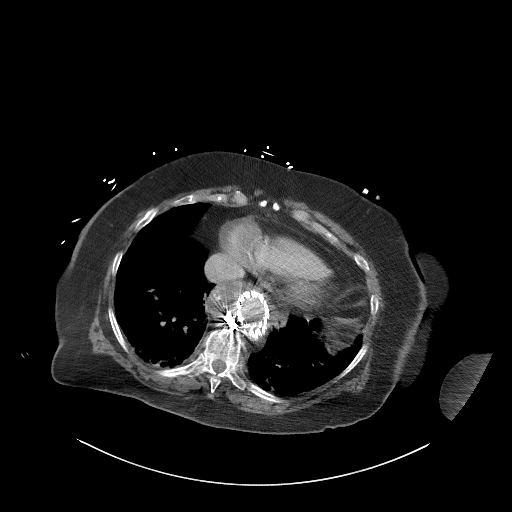

[Series 4: coronal st · coronal · 0.85mm/px · 3 of 121 slices shown]
[im 41/121  soft-tissue]
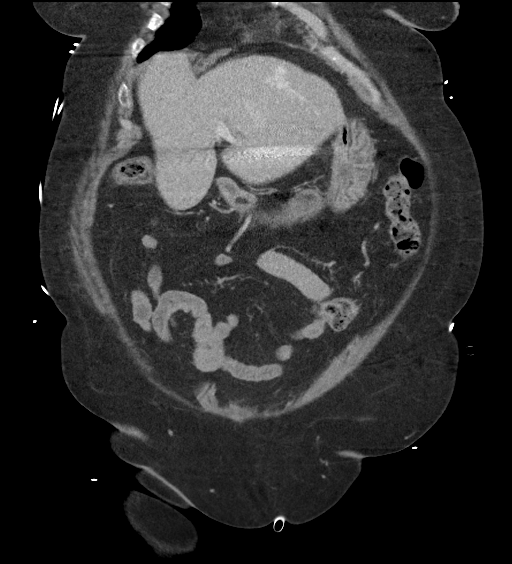
[im 54/121  soft-tissue]
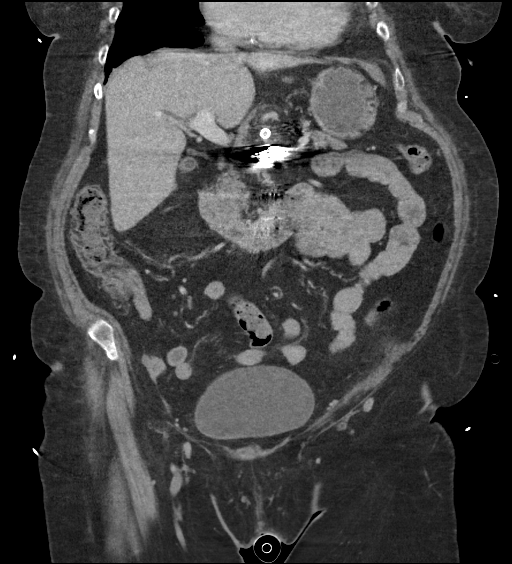
[im 67/121  soft-tissue]
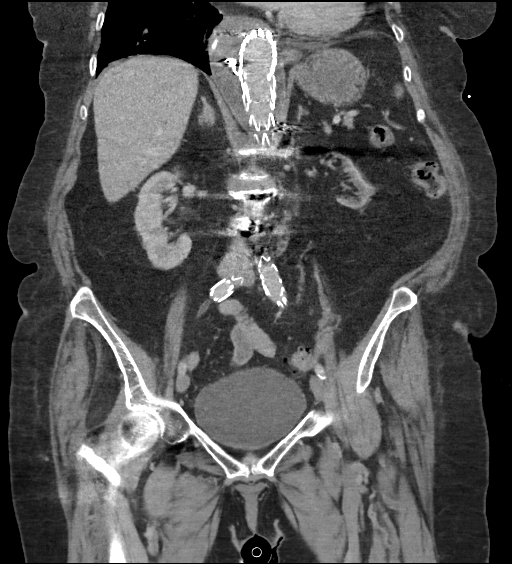

[14 of 46 positions shown; findings below may reference images not displayed]

CT lumbar
spine dated 11/16/2017 and CT abdomen and pelvis report dated
11/04/2003 without corresponding images.
FINDINGS: Lower chest: Dependent ground-glass opacities in the lung bases,
favoring atelectasis. Coronary artery calcifications. No pericardial
effusion. Extension of a endovascular stent from the thoracic aorta
at the superior margins of imaging into the abdomen. Excluded
thoracic aortic aneurysm sac with a maximum diameter 6.3 x 5.3 cm in
size. Hyperdense material with extensive shadowing may reflect
sequela of treatment for prior endoleak. No comparison imaging is
available to assess the stability of this aneurysm sac.

Hepatobiliary: Fluid attenuation probable cyst measuring 1.6 cm in
the posterior aspect segment 4 ([DATE]). Intrahepatic vascular shunt
noted in the anterior left lobe liver ([DATE]). Possible cyst versus
hemangioma in the posterior right lobe liver (2/36) measuring up to
9 mm in size, incompletely characterized on this exam. No other
focal liver lesions. Smooth surface contour. Hepatic attenuation
within normal limits. Gallbladder contains multiple dependently
layering gallstones. No significant biliary ductal dilatation
accounting for senescent changes. No discernible intraductal
gallstones though evaluation is limited by streak artifact from
prior endovascular repair.

Pancreas: No pancreatic ductal dilatation or surrounding
inflammatory changes.

Spleen: Normal in size. No concerning splenic lesions.

Adrenals/Urinary Tract: Normal adrenals. Atrophic left kidney with
vascular calcifications. Areas of cortical scarring in the right
kidney. Right extrarenal pelvis. No obstructive urolithiasis or
hydronephrosis. Urinary bladder is unremarkable for the degree of
distention. No visible bladder calculi or debris.

Stomach/Bowel: Distal esophagus, stomach and duodenum are
unremarkable. No small bowel thickening or dilatation. Appendix is
not visualized. No focal inflammation the vicinity of the cecum to
suggest an occult appendicitis. Intramural fatty infiltration of the
ascending colon is nonspecific finding that can be seen as sequela
lipomatous body habitus or prior inflammatory bowel disease. No
active inflammation at this time. Distal colonic diverticulosis
without acute diverticular inflammation to suggest an acute
diverticulitis. No bowel obstruction.

Vascular/Lymphatic: Extension of the endovascular stent graft
through the common iliac arteries bilaterally terminating proximal
to the level of the iliac bifurcations. Visible stenting of the SMA
and right renal artery origin. Further assessment limited on this
non angiographic technique. No suspicious or enlarged lymph nodes in
the included lymphatic chains.

Reproductive: Uterus is surgically absent. No concerning adnexal
lesions.

Other: No abdominopelvic free fluid or free gas. No bowel containing
hernias.

Musculoskeletal: The osseous structures appear diffusely
demineralized which may limit detection of small or nondisplaced
fractures. Remote deformity prior right femoral intertrochanteric
fracture with intramedullary nail placement and transcervical
pending. Prior L3-L5 posterior spinal fusion and interbody spacer
placement without acute hardware complication, fracture or failure.
Background of diffuse multilevel discogenic and facet degenerative
changes well as additional degenerative changes throughout the hips
and pelvis. No acute osseous abnormality or suspicious osseous
lesion. Musculature is normal and symmetric.
IMPRESSION: Cholelithiasis without evidence of acute cholecystitis or biliary
ductal dilatation accounting for typical senescent change. Could
correlate with serologies and if there is persisting clinical
concern, right upper quadrant ultrasound could be obtained.

Long segment aortoiliac endovascular repair extending from the
superior margins of imaging in the chest to the level of the common
iliac artery just proximal to the bifurcation. There isn't excluded
thoracic aortic aneurysm sac measuring up to 6.3 x 5.3 cm in size.
While no direct comparison imaging is available at the time of exam.
Outside imaging report from an exam performed 07/15/2020 indicated a
maximal dimension of 6.4 x 5.6 cm not significantly changed
accounting for differences in measurement technique. Branch stenting
of the SMA and right renal artery as well.

Hypodensities in segment 4 and segment 6 of the liver reportedly
present on outside comparison prior with similar measurements.
Additional note made of an intrahepatic vascular shunt in the left
lobe liver.

These results were called by telephone at the time of interpretation
on 09/06/2020 at [DATE] to provider DREY JIM , who verbally
acknowledged these results.

## 2023-03-08 IMAGING — CT CT HEAD W/O CM
3 series · 15 of 47 positions shown, 18 images · non-contrast
Comparison: Report from head CT 12/22/2014 (images unavailable).
Report from brain MRI 08/18/2013 (images unavailable).

CLINICAL DATA: Neuro deficit, acute, stroke suspected; dizziness,
nonspecific. Additional history provided: Dizziness, nausea and
vomiting.

EXAM:
CT HEAD WITHOUT CONTRAST
TECHNIQUE: Contiguous axial images were obtained from the base of the skull
through the vertex without intravenous contrast.

[Series 2: head w o · axial · 0.41mm/px · z∈[-24,+106]mm · 9 of 32 slices shown, 12 images]
[im 3/32  brain]
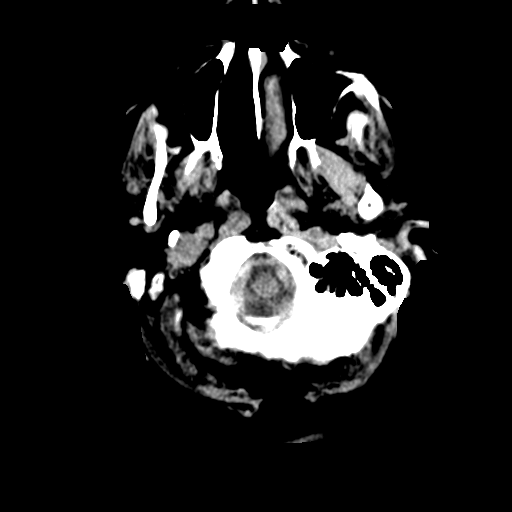
[im 3/32  bone]
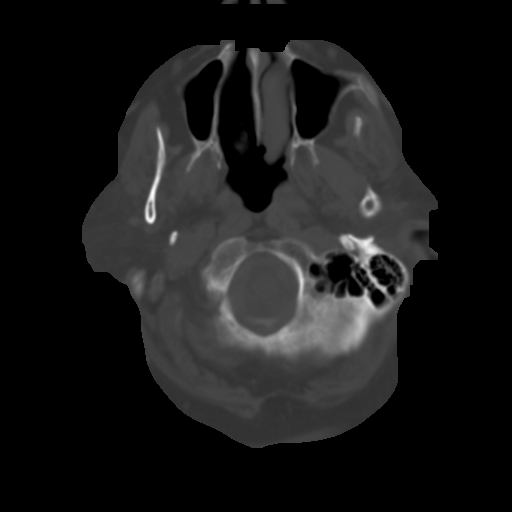
[im 6/32  brain]
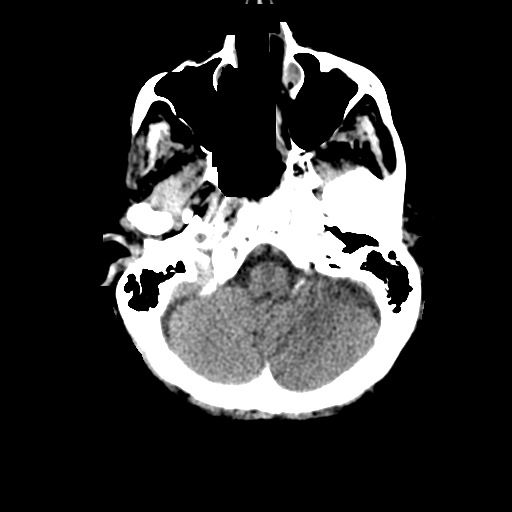
[im 9/32  brain]
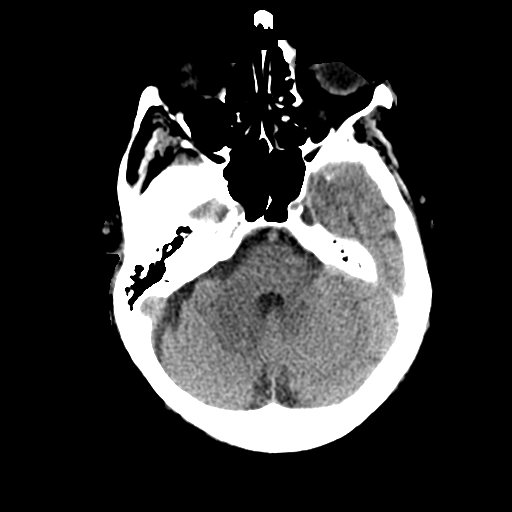
[im 12/32  brain]
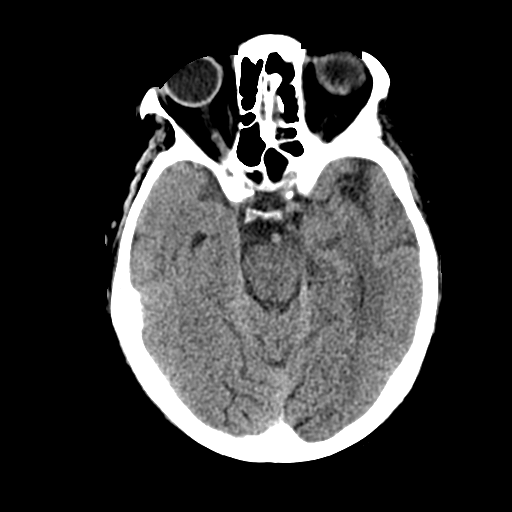
[im 17/32  brain]
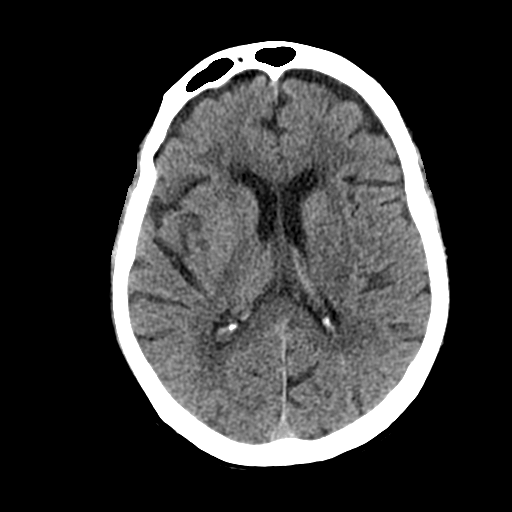
[im 17/32  bone]
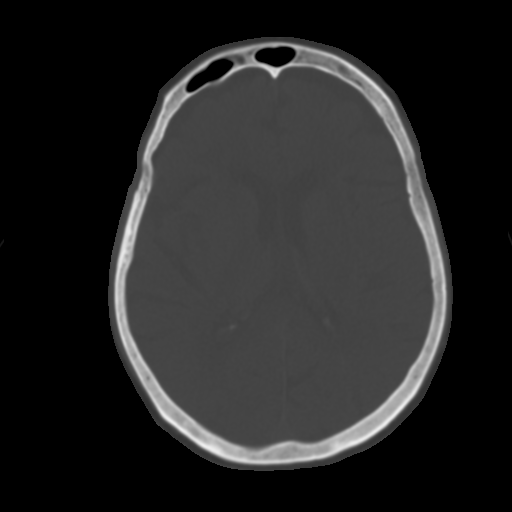
[im 20/32  brain]
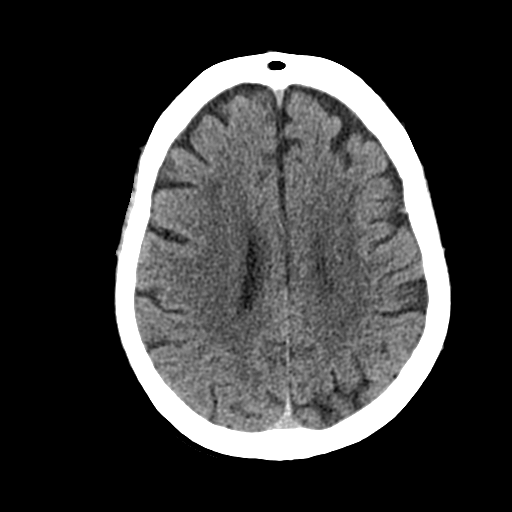
[im 23/32  brain]
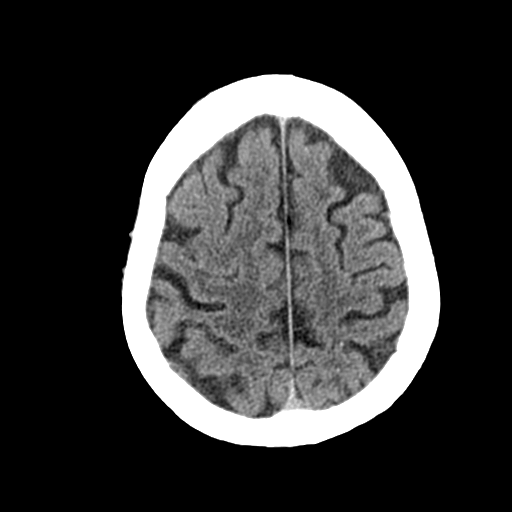
[im 26/32  brain]
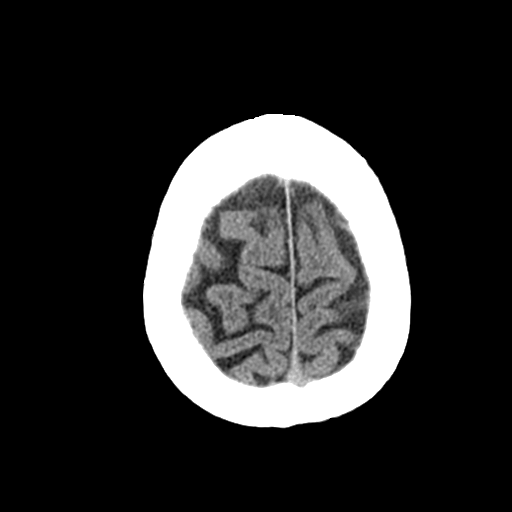
[im 29/32  brain]
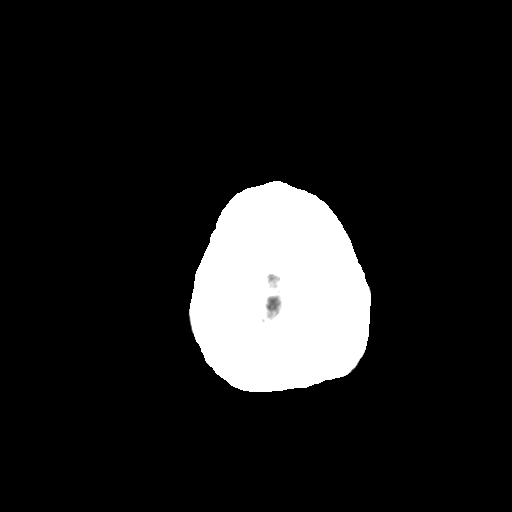
[im 29/32  bone]
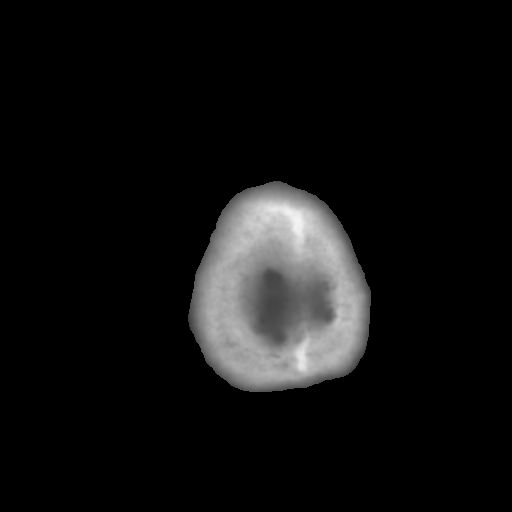

[Series 4: coronal soft · coronal · 0.29mm/px · 3 of 71 slices shown]
[im 24/71  brain]
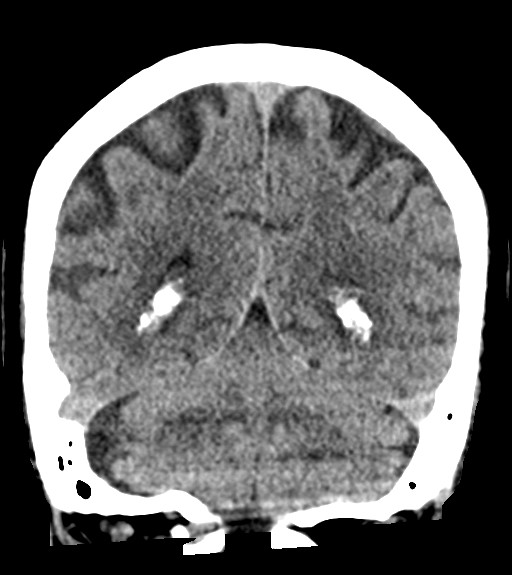
[im 32/71  brain]
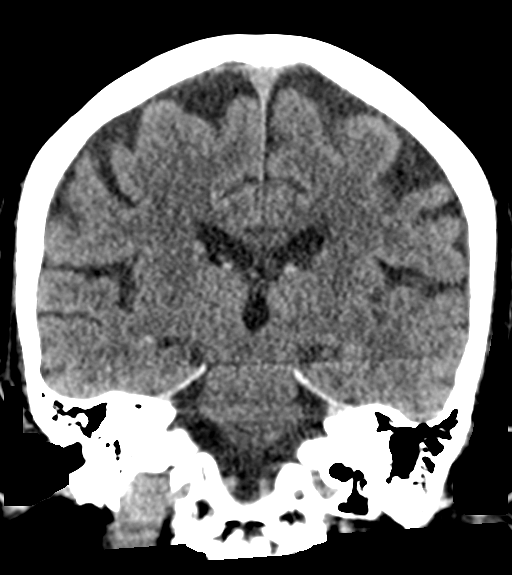
[im 39/71  brain]
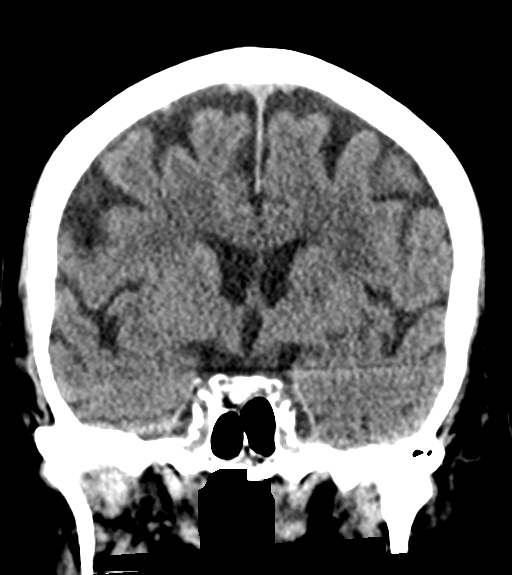

[Series 5: sagittal soft · sagittal · 0.35mm/px · 3 of 57 slices shown]
[im 19/57  brain]
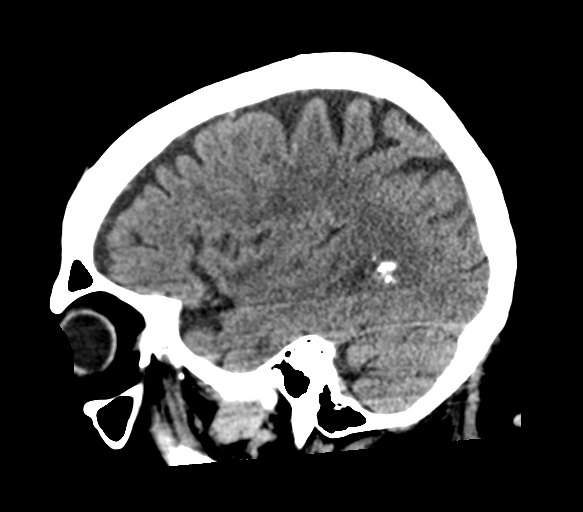
[im 29/57  brain]
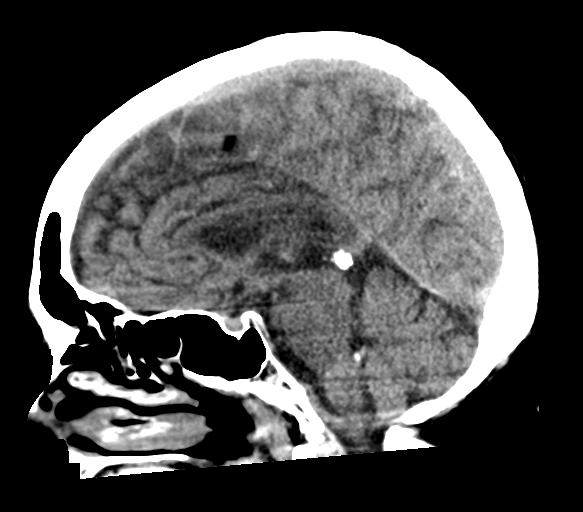
[im 38/57  brain]
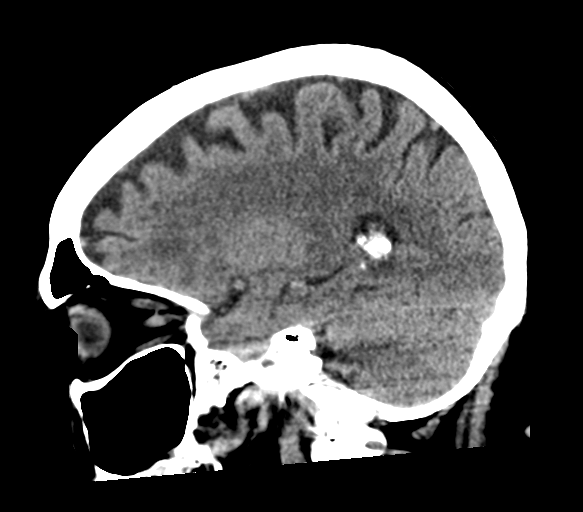

[15 of 47 positions shown; findings below may reference images not displayed]

FINDINGS: Brain:

Mild generalized cerebral atrophy.

Mild patchy and ill-defined hypoattenuation within the cerebral
white matter, nonspecific but compatible with chronic small vessel
ischemic disease.

Prominent perivascular space within the inferior left basal ganglia.

Age-indeterminate lacunar infarct within the right pons (series 2,
image 11) (series 4, image 38).

There is no acute intracranial hemorrhage.

No demarcated cortical infarct.

No extra-axial fluid collection.

No evidence of an intracranial mass.

No midline shift.

Vascular: No hyperdense vessel.  Atherosclerotic calcifications.

Skull: Normal. Negative for fracture or focal lesion.

Sinuses/Orbits: Visualized orbits show no acute finding. Mild
mucosal thickening within the bilateral ethmoid sinuses.
IMPRESSION: Age-indeterminate lacunar infarct within the right pons. A brain MRI
may be obtained for further evaluation, as clinically warranted.

Mild generalized cerebral atrophy and cerebral white matter chronic
small ischemic disease.

Mild bilateral ethmoid sinus mucosal thickening.
